# Patient Record
Sex: Male | Born: 1985
Health system: Southern US, Community
[De-identification: ages and names within clinical notes are randomized; demographics above are authoritative.]

## PROBLEM LIST (undated history)

## (undated) DIAGNOSIS — I10 Essential (primary) hypertension: Secondary | ICD-10-CM

## (undated) DIAGNOSIS — K219 Gastro-esophageal reflux disease without esophagitis: Secondary | ICD-10-CM

## (undated) DIAGNOSIS — M542 Cervicalgia: Secondary | ICD-10-CM

## (undated) DIAGNOSIS — G8929 Other chronic pain: Secondary | ICD-10-CM

## (undated) DIAGNOSIS — M549 Dorsalgia, unspecified: Secondary | ICD-10-CM

## (undated) HISTORY — PX: TONSILLECTOMY: SUR1361

## (undated) HISTORY — PX: OTHER SURGICAL HISTORY: SHX169

## (undated) HISTORY — PX: HERNIA REPAIR: SHX51

---
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
## 2006-02-22 ENCOUNTER — Ambulatory Visit: Payer: Self-pay | Admitting: Orthopedic Surgery

## 2006-06-19 ENCOUNTER — Emergency Department (HOSPITAL_COMMUNITY): Admission: EM | Admit: 2006-06-19 | Discharge: 2006-06-20 | Payer: Self-pay | Admitting: Emergency Medicine

## 2007-09-18 ENCOUNTER — Ambulatory Visit (HOSPITAL_COMMUNITY): Admission: RE | Admit: 2007-09-18 | Discharge: 2007-09-18 | Payer: Self-pay | Admitting: Family Medicine

## 2008-10-26 ENCOUNTER — Emergency Department (HOSPITAL_COMMUNITY): Admission: EM | Admit: 2008-10-26 | Discharge: 2008-10-26 | Payer: Self-pay | Admitting: Emergency Medicine

## 2009-04-08 ENCOUNTER — Ambulatory Visit (HOSPITAL_COMMUNITY): Admission: RE | Admit: 2009-04-08 | Discharge: 2009-04-08 | Payer: Self-pay | Admitting: Family Medicine

## 2009-08-14 ENCOUNTER — Emergency Department (HOSPITAL_COMMUNITY): Admission: EM | Admit: 2009-08-14 | Discharge: 2009-08-15 | Payer: Self-pay | Admitting: Emergency Medicine

## 2009-08-26 ENCOUNTER — Ambulatory Visit (HOSPITAL_COMMUNITY): Admission: RE | Admit: 2009-08-26 | Discharge: 2009-08-26 | Payer: Self-pay | Admitting: Internal Medicine

## 2010-03-20 ENCOUNTER — Emergency Department (HOSPITAL_COMMUNITY)
Admission: EM | Admit: 2010-03-20 | Discharge: 2010-03-20 | Payer: Self-pay | Source: Home / Self Care | Admitting: Emergency Medicine

## 2010-03-25 ENCOUNTER — Emergency Department (HOSPITAL_COMMUNITY): Admission: EM | Admit: 2010-03-25 | Discharge: 2009-05-24 | Payer: Self-pay | Admitting: Emergency Medicine

## 2010-05-14 ENCOUNTER — Ambulatory Visit (HOSPITAL_COMMUNITY)
Admission: RE | Admit: 2010-05-14 | Discharge: 2010-05-14 | Payer: Self-pay | Source: Home / Self Care | Attending: Family Medicine | Admitting: Family Medicine

## 2010-06-28 LAB — POCT CARDIAC MARKERS
CKMB, poc: <1 ng/mL — ABNORMAL LOW (ref 1.0–8.0)
CKMB, poc: <1 ng/mL — ABNORMAL LOW (ref 1.0–8.0)
Myoglobin, poc: 39.3 ng/mL (ref 12–200)
Myoglobin, poc: 52.7 ng/mL (ref 12–200)
Troponin i, poc: <0.05 ng/mL (ref 0.00–0.09)
Troponin i, poc: <0.05 ng/mL (ref 0.00–0.09)

## 2010-06-28 LAB — BASIC METABOLIC PANEL
BUN: 15 mg/dL (ref 6–23)
CO2: 29 mEq/L (ref 19–32)
Calcium: 9.6 mg/dL (ref 8.4–10.5)
Chloride: 101 mEq/L (ref 96–112)
Creatinine, Ser: 1.04 mg/dL (ref 0.4–1.5)
GFR calc Af Amer: >60 mL/min (ref >60)
GFR calc non Af Amer: >60 mL/min (ref >60)
Glucose, Bld: 90 mg/dL (ref 70–99)
Potassium: 4.4 mEq/L (ref 3.5–5.1)
Sodium: 138 mEq/L (ref 135–145)

## 2010-06-28 LAB — D-DIMER, QUANTITATIVE (NOT AT ARMC): D-Dimer, Quant: 0.22 ug/mL-FEU (ref 0.00–0.48)

## 2010-07-07 LAB — CBC
HCT: 43.7 % (ref 39.0–52.0)
Hemoglobin: 15 g/dL (ref 13.0–17.0)
MCHC: 34.3 g/dL (ref 30.0–36.0)
MCV: 87.1 fL (ref 78.0–100.0)
Platelets: 254 10*3/uL (ref 150–400)
RBC: 5.01 MIL/uL (ref 4.22–5.81)
RDW: 13.3 % (ref 11.5–15.5)
WBC: 9.3 10*3/uL (ref 4.0–10.5)

## 2010-07-07 LAB — POCT CARDIAC MARKERS
CKMB, poc: <1 ng/mL — ABNORMAL LOW (ref 1.0–8.0)
Myoglobin, poc: 53.7 ng/mL (ref 12–200)
Troponin i, poc: <0.05 ng/mL (ref 0.00–0.09)

## 2010-07-07 LAB — URINALYSIS, ROUTINE W REFLEX MICROSCOPIC
Bilirubin Urine: NEGATIVE
Glucose, UA: NEGATIVE mg/dL
Hgb urine dipstick: NEGATIVE
Ketones, ur: NEGATIVE mg/dL
Nitrite: NEGATIVE
Protein, ur: NEGATIVE mg/dL
Specific Gravity, Urine: 1.02 (ref 1.005–1.030)
Urobilinogen, UA: 0.2 mg/dL (ref 0.0–1.0)
pH: 6.5 (ref 5.0–8.0)

## 2010-07-07 LAB — BASIC METABOLIC PANEL
BUN: 15 mg/dL (ref 6–23)
CO2: 28 mEq/L (ref 19–32)
Calcium: 9.7 mg/dL (ref 8.4–10.5)
Chloride: 103 mEq/L (ref 96–112)
Creatinine, Ser: 1.07 mg/dL (ref 0.4–1.5)
GFR calc Af Amer: >60 mL/min (ref >60)
GFR calc non Af Amer: >60 mL/min (ref >60)
Glucose, Bld: 99 mg/dL (ref 70–99)
Potassium: 4 mEq/L (ref 3.5–5.1)
Sodium: 137 mEq/L (ref 135–145)

## 2010-07-07 LAB — DIFFERENTIAL
Basophils Absolute: 0 10*3/uL (ref 0.0–0.1)
Basophils Relative: 0 % (ref 0–1)
Eosinophils Absolute: 0.1 10*3/uL (ref 0.0–0.7)
Eosinophils Relative: 1 % (ref 0–5)
Lymphocytes Relative: 24 % (ref 12–46)
Lymphs Abs: 2.2 10*3/uL (ref 0.7–4.0)
Monocytes Absolute: 0.8 10*3/uL (ref 0.1–1.0)
Monocytes Relative: 9 % (ref 3–12)
Neutro Abs: 6.1 10*3/uL (ref 1.7–7.7)
Neutrophils Relative %: 66 % (ref 43–77)

## 2010-07-07 LAB — RAPID URINE DRUG SCREEN, HOSP PERFORMED
Amphetamines: NOT DETECTED
Barbiturates: NOT DETECTED
Benzodiazepines: NOT DETECTED
Cocaine: NOT DETECTED
Opiates: NOT DETECTED
Tetrahydrocannabinol: NOT DETECTED

## 2010-07-07 LAB — D-DIMER, QUANTITATIVE: D-Dimer, Quant: 0.22 ug/mL-FEU (ref 0.00–0.48)

## 2010-07-11 ENCOUNTER — Emergency Department (HOSPITAL_COMMUNITY): Payer: Managed Care, Other (non HMO)

## 2010-07-11 ENCOUNTER — Emergency Department (HOSPITAL_COMMUNITY)
Admission: EM | Admit: 2010-07-11 | Discharge: 2010-07-11 | Disposition: A | Discharge status: Home / Self Care | Payer: Managed Care, Other (non HMO) | Attending: Emergency Medicine | Admitting: Emergency Medicine

## 2010-07-11 DIAGNOSIS — S0993XA Unspecified injury of face, initial encounter: Secondary | ICD-10-CM | POA: Insufficient documentation

## 2010-07-11 DIAGNOSIS — S199XXA Unspecified injury of neck, initial encounter: Secondary | ICD-10-CM | POA: Insufficient documentation

## 2010-07-11 DIAGNOSIS — W010XXA Fall on same level from slipping, tripping and stumbling without subsequent striking against object, initial encounter: Secondary | ICD-10-CM | POA: Insufficient documentation

## 2010-07-11 DIAGNOSIS — S0990XA Unspecified injury of head, initial encounter: Secondary | ICD-10-CM | POA: Insufficient documentation

## 2010-07-11 DIAGNOSIS — S060X0A Concussion without loss of consciousness, initial encounter: Secondary | ICD-10-CM | POA: Insufficient documentation

## 2010-07-11 DIAGNOSIS — R42 Dizziness and giddiness: Secondary | ICD-10-CM | POA: Insufficient documentation

## 2010-08-09 ENCOUNTER — Emergency Department (HOSPITAL_COMMUNITY): Payer: Managed Care, Other (non HMO)

## 2010-08-09 ENCOUNTER — Emergency Department (HOSPITAL_COMMUNITY)
Admission: EM | Admit: 2010-08-09 | Discharge: 2010-08-09 | Disposition: A | Discharge status: Home / Self Care | Payer: Managed Care, Other (non HMO) | Attending: Emergency Medicine | Admitting: Emergency Medicine

## 2010-08-09 DIAGNOSIS — T07XXXA Unspecified multiple injuries, initial encounter: Secondary | ICD-10-CM | POA: Insufficient documentation

## 2010-08-09 DIAGNOSIS — Y9229 Other specified public building as the place of occurrence of the external cause: Secondary | ICD-10-CM | POA: Insufficient documentation

## 2010-08-09 DIAGNOSIS — S0990XA Unspecified injury of head, initial encounter: Secondary | ICD-10-CM | POA: Insufficient documentation

## 2010-08-09 DIAGNOSIS — Y998 Other external cause status: Secondary | ICD-10-CM | POA: Insufficient documentation

## 2010-08-09 DIAGNOSIS — M545 Low back pain, unspecified: Secondary | ICD-10-CM | POA: Insufficient documentation

## 2010-08-09 DIAGNOSIS — M542 Cervicalgia: Secondary | ICD-10-CM | POA: Insufficient documentation

## 2010-08-09 DIAGNOSIS — J45909 Unspecified asthma, uncomplicated: Secondary | ICD-10-CM | POA: Insufficient documentation

## 2010-08-09 DIAGNOSIS — R51 Headache: Secondary | ICD-10-CM | POA: Insufficient documentation

## 2010-08-09 DIAGNOSIS — R079 Chest pain, unspecified: Secondary | ICD-10-CM | POA: Insufficient documentation

## 2010-08-09 DIAGNOSIS — Z79899 Other long term (current) drug therapy: Secondary | ICD-10-CM | POA: Insufficient documentation

## 2010-08-09 LAB — DIFFERENTIAL
Basophils Absolute: 0 10*3/uL (ref 0.0–0.1)
Basophils Relative: 0 % (ref 0–1)
Eosinophils Absolute: 0.3 10*3/uL (ref 0.0–0.7)
Eosinophils Relative: 3 % (ref 0–5)
Lymphocytes Relative: 24 % (ref 12–46)
Lymphs Abs: 2.5 10*3/uL (ref 0.7–4.0)
Monocytes Absolute: 1 10*3/uL (ref 0.1–1.0)
Monocytes Relative: 9 % (ref 3–12)
Neutro Abs: 6.7 10*3/uL (ref 1.7–7.7)
Neutrophils Relative %: 64 % (ref 43–77)

## 2010-08-09 LAB — CBC
HCT: 45 % (ref 39.0–52.0)
Hemoglobin: 15.6 g/dL (ref 13.0–17.0)
MCH: 29.6 pg (ref 26.0–34.0)
MCHC: 34.7 g/dL (ref 30.0–36.0)
MCV: 85.4 fL (ref 78.0–100.0)
Platelets: 319 10*3/uL (ref 150–400)
RBC: 5.27 MIL/uL (ref 4.22–5.81)
RDW: 12.7 % (ref 11.5–15.5)
WBC: 10.4 10*3/uL (ref 4.0–10.5)

## 2010-08-09 LAB — URINALYSIS, ROUTINE W REFLEX MICROSCOPIC
Bilirubin Urine: NEGATIVE
Glucose, UA: NEGATIVE mg/dL
Hgb urine dipstick: NEGATIVE
Ketones, ur: NEGATIVE mg/dL
Nitrite: NEGATIVE
Protein, ur: NEGATIVE mg/dL
Specific Gravity, Urine: 1.025 (ref 1.005–1.030)
Urobilinogen, UA: 0.2 mg/dL (ref 0.0–1.0)
pH: 6 (ref 5.0–8.0)

## 2010-08-09 LAB — POCT CARDIAC MARKERS
CKMB, poc: <1 ng/mL — ABNORMAL LOW (ref 1.0–8.0)
Myoglobin, poc: 34.6 ng/mL (ref 12–200)
Troponin i, poc: <0.05 ng/mL (ref 0.00–0.09)

## 2010-08-09 LAB — BASIC METABOLIC PANEL
BUN: 13 mg/dL (ref 6–23)
CO2: 27 mEq/L (ref 19–32)
Calcium: 9.9 mg/dL (ref 8.4–10.5)
Chloride: 103 mEq/L (ref 96–112)
Creatinine, Ser: 0.92 mg/dL (ref 0.4–1.5)
GFR calc Af Amer: >60 mL/min (ref >60)
GFR calc non Af Amer: >60 mL/min (ref >60)
Glucose, Bld: 108 mg/dL — ABNORMAL HIGH (ref 70–99)
Potassium: 3.7 mEq/L (ref 3.5–5.1)
Sodium: 139 mEq/L (ref 135–145)

## 2010-10-10 ENCOUNTER — Emergency Department (HOSPITAL_COMMUNITY): Payer: Managed Care, Other (non HMO)

## 2010-10-10 ENCOUNTER — Emergency Department (HOSPITAL_COMMUNITY)
Admission: EM | Admit: 2010-10-10 | Discharge: 2010-10-10 | Disposition: A | Discharge status: Home / Self Care | Payer: Managed Care, Other (non HMO) | Attending: Emergency Medicine | Admitting: Emergency Medicine

## 2010-10-10 DIAGNOSIS — S60229A Contusion of unspecified hand, initial encounter: Secondary | ICD-10-CM | POA: Insufficient documentation

## 2010-10-10 DIAGNOSIS — J45909 Unspecified asthma, uncomplicated: Secondary | ICD-10-CM | POA: Insufficient documentation

## 2010-10-10 DIAGNOSIS — IMO0002 Reserved for concepts with insufficient information to code with codable children: Secondary | ICD-10-CM | POA: Insufficient documentation

## 2010-10-10 DIAGNOSIS — X58XXXA Exposure to other specified factors, initial encounter: Secondary | ICD-10-CM | POA: Insufficient documentation

## 2010-11-07 ENCOUNTER — Other Ambulatory Visit: Payer: Self-pay

## 2010-11-07 ENCOUNTER — Encounter: Payer: Self-pay | Admitting: *Deleted

## 2010-11-07 ENCOUNTER — Emergency Department (HOSPITAL_COMMUNITY)
Admission: EM | Admit: 2010-11-07 | Discharge: 2010-11-07 | Disposition: A | Discharge status: Home / Self Care | Payer: Managed Care, Other (non HMO) | Attending: Emergency Medicine | Admitting: Emergency Medicine

## 2010-11-07 ENCOUNTER — Emergency Department (HOSPITAL_COMMUNITY): Payer: Managed Care, Other (non HMO)

## 2010-11-07 DIAGNOSIS — R079 Chest pain, unspecified: Secondary | ICD-10-CM | POA: Insufficient documentation

## 2010-11-07 DIAGNOSIS — M549 Dorsalgia, unspecified: Secondary | ICD-10-CM | POA: Insufficient documentation

## 2010-11-07 DIAGNOSIS — Z807 Family history of other malignant neoplasms of lymphoid, hematopoietic and related tissues: Secondary | ICD-10-CM | POA: Insufficient documentation

## 2010-11-07 DIAGNOSIS — J45909 Unspecified asthma, uncomplicated: Secondary | ICD-10-CM | POA: Insufficient documentation

## 2010-11-07 DIAGNOSIS — I10 Essential (primary) hypertension: Secondary | ICD-10-CM | POA: Insufficient documentation

## 2010-11-07 DIAGNOSIS — F17200 Nicotine dependence, unspecified, uncomplicated: Secondary | ICD-10-CM | POA: Insufficient documentation

## 2010-11-07 HISTORY — DX: Essential (primary) hypertension: I10

## 2010-11-07 HISTORY — DX: Other chronic pain: G89.29

## 2010-11-07 HISTORY — DX: Dorsalgia, unspecified: M54.9

## 2010-11-07 LAB — CBC
HCT: 41.5 % (ref 39.0–52.0)
Hemoglobin: 14.1 g/dL (ref 13.0–17.0)
MCH: 29.4 pg (ref 26.0–34.0)
MCHC: 34 g/dL (ref 30.0–36.0)
MCV: 86.5 fL (ref 78.0–100.0)
Platelets: 282 10*3/uL (ref 150–400)
RBC: 4.8 MIL/uL (ref 4.22–5.81)
RDW: 12.9 % (ref 11.5–15.5)
WBC: 11.7 10*3/uL — ABNORMAL HIGH (ref 4.0–10.5)

## 2010-11-07 MED ORDER — IBUPROFEN 800 MG PO TABS: 800.0000 mg | ORAL_TABLET | Freq: Three times a day (TID) | ORAL | Status: AC

## 2010-11-07 MED ORDER — KETOROLAC TROMETHAMINE 60 MG/2ML IM SOLN
60.0000 mg | Freq: Once | INTRAMUSCULAR | Status: AC
Start: 2010-11-07 — End: 2010-11-07
  Administered 2010-11-07: 60 mg via INTRAMUSCULAR
  Filled 2010-11-07: qty 2

## 2010-11-07 NOTE — ED Notes (Signed)
Pt states that he has been having worsening chest pain x 4 days. Pt also c/o nausea. States that his brother just found out he has cancer and he has been worried about him. Denies shortness of breath.

## 2010-11-07 NOTE — ED Provider Notes (Signed)
History     No chief complaint on file.  HPI Comments: Patient is a 25 year old otherwise healthy male with treated hypertension, presents with chest pain which has been fluctuating for 4 days. It is sharp, intermittent, today seems to be more constant, and is similar to pain that he has had frequently in the past. He found out approximately 3 weeks ago that his brother was recently diagnosed with lymphoma. He notes being anxious since this time. He also lives near his mother who has recently flown to [place] on airplane who is widowed with health problems including multiple brain surgeries. All these factors promote his anxiety which he admits to today. He has had aspirin last night and Vicodin this morning with some improvement of his pain though it has returned at this time. He denies any relation to deep breathing or movement or position or eating or exertion.  The history is provided by the patient and medical records.    Past Medical History  Diagnosis Date  . Hypertension   . Chronic back pain   . Asthma     Past Surgical History  Procedure Date  . Hernia repair     Family History  Problem Relation Age of Onset  . Hypertension Mother   . Seizures Mother   . Cancer Brother     History  Substance Use Topics  . Smoking status: Current Some Day Smoker    Types: Cigarettes  . Smokeless tobacco: Not on file  . Alcohol Use: No      Review of Systems  All other systems reviewed and are negative.    Physical Exam  BP 147/99  Pulse 78  Temp(Src) 98.8 F (37.1 C) (Oral)  Resp 78  SpO2 98%  Physical Exam  Nursing note and vitals reviewed. Constitutional: He appears well-developed and well-nourished. No distress.  HENT:  Head: Normocephalic and atraumatic.  Mouth/Throat: Oropharynx is clear and moist. No oropharyngeal exudate.  Eyes: Conjunctivae and EOM are normal. Pupils are equal, round, and reactive to light. Right eye exhibits no discharge. Left eye exhibits no  discharge. No scleral icterus.  Neck: Normal range of motion. Neck supple. No JVD present. No thyromegaly present.  Cardiovascular: Normal rate, regular rhythm, normal heart sounds and intact distal pulses.  Exam reveals no gallop and no friction rub.   No murmur heard. Pulmonary/Chest: Effort normal and breath sounds normal. No respiratory distress. He has no wheezes. He has no rales.  Abdominal: Soft. Bowel sounds are normal. He exhibits no distension and no mass. There is no tenderness.  Musculoskeletal: Normal range of motion. He exhibits no edema and no tenderness.  Lymphadenopathy:    He has no cervical adenopathy.  Neurological: He is alert. Coordination normal.  Skin: Skin is warm and dry. No rash noted. He is not diaphoretic. No erythema.  Psychiatric: He has a normal mood and affect. His behavior is normal.    ED Course  Procedures  MDM Patient has normal EKG, vital signs within reason given history of hypertension, mild anxious appearing. Will get chest x-ray to rule out pneumothorax, CBC to release the anxiety of possible blood born illness in himself. ED ECG REPORT   Date: 11/07/2010   Rate: 78  Rhythm: normal sinus rhythm  QRS Axis: normal  Intervals: normal  ST/T Wave abnormalities: normal  Conduction Disutrbances:none  Narrative Interpretation:   Old EKG Reviewed: unchanged 08/09/10  Chest x-ray negative for any acute findings, CBC with normal hemoglobin slight leukocytosis but no signs  of pericarditis on EKG, no rubs murmurs or gallops, on exam, and no infiltrates on x-ray. Patient aware results and will followup with family doctor as indicated.     Vida Roller, MD 11/07/10 [phone]

## 2010-11-07 NOTE — ED Notes (Signed)
Pt showing NSR on CCM. Pt alert and oriented x 3. Skin warm and dry. Color pink. Pt anxious and states that it has been gradually getting worse.

## 2010-12-03 ENCOUNTER — Emergency Department (HOSPITAL_COMMUNITY)
Admission: EM | Admit: 2010-12-03 | Discharge: 2010-12-04 | Disposition: A | Discharge status: Home / Self Care | Payer: Managed Care, Other (non HMO) | Attending: Emergency Medicine | Admitting: Emergency Medicine

## 2010-12-03 ENCOUNTER — Encounter (HOSPITAL_COMMUNITY): Payer: Self-pay | Admitting: *Deleted

## 2010-12-03 ENCOUNTER — Emergency Department (HOSPITAL_COMMUNITY): Payer: Managed Care, Other (non HMO)

## 2010-12-03 ENCOUNTER — Other Ambulatory Visit: Payer: Self-pay

## 2010-12-03 DIAGNOSIS — M7989 Other specified soft tissue disorders: Secondary | ICD-10-CM | POA: Insufficient documentation

## 2010-12-03 DIAGNOSIS — F17200 Nicotine dependence, unspecified, uncomplicated: Secondary | ICD-10-CM | POA: Insufficient documentation

## 2010-12-03 DIAGNOSIS — M549 Dorsalgia, unspecified: Secondary | ICD-10-CM | POA: Insufficient documentation

## 2010-12-03 DIAGNOSIS — I10 Essential (primary) hypertension: Secondary | ICD-10-CM | POA: Insufficient documentation

## 2010-12-03 DIAGNOSIS — J45909 Unspecified asthma, uncomplicated: Secondary | ICD-10-CM | POA: Insufficient documentation

## 2010-12-03 DIAGNOSIS — R079 Chest pain, unspecified: Secondary | ICD-10-CM | POA: Insufficient documentation

## 2010-12-03 LAB — BASIC METABOLIC PANEL
BUN: 15 mg/dL (ref 6–23)
CO2: 30 mEq/L (ref 19–32)
Calcium: 10.3 mg/dL (ref 8.4–10.5)
Chloride: 96 mEq/L (ref 96–112)
Creatinine, Ser: 0.86 mg/dL (ref 0.50–1.35)
GFR calc Af Amer: >60 mL/min (ref >60)
GFR calc non Af Amer: >60 mL/min (ref >60)
Glucose, Bld: 75 mg/dL (ref 70–99)
Potassium: 3.7 mEq/L (ref 3.5–5.1)
Sodium: 135 mEq/L (ref 135–145)

## 2010-12-03 LAB — CBC
HCT: 41.8 % (ref 39.0–52.0)
Hemoglobin: 14.5 g/dL (ref 13.0–17.0)
MCH: 29.5 pg (ref 26.0–34.0)
MCHC: 34.7 g/dL (ref 30.0–36.0)
MCV: 85.1 fL (ref 78.0–100.0)
Platelets: 308 10*3/uL (ref 150–400)
RBC: 4.91 MIL/uL (ref 4.22–5.81)
RDW: 12.8 % (ref 11.5–15.5)
WBC: 11.1 10*3/uL — ABNORMAL HIGH (ref 4.0–10.5)

## 2010-12-03 LAB — DIFFERENTIAL
Basophils Absolute: 0 10*3/uL (ref 0.0–0.1)
Basophils Relative: 0 % (ref 0–1)
Eosinophils Absolute: 0.3 10*3/uL (ref 0.0–0.7)
Eosinophils Relative: 3 % (ref 0–5)
Lymphocytes Relative: 27 % (ref 12–46)
Lymphs Abs: 3 10*3/uL (ref 0.7–4.0)
Monocytes Absolute: 0.9 10*3/uL (ref 0.1–1.0)
Monocytes Relative: 8 % (ref 3–12)
Neutro Abs: 6.9 10*3/uL (ref 1.7–7.7)
Neutrophils Relative %: 62 % (ref 43–77)

## 2010-12-03 LAB — POCT I-STAT TROPONIN I: Troponin i, poc: 0 ng/mL (ref 0.00–0.08)

## 2010-12-03 LAB — CARDIAC PANEL(CRET KIN+CKTOT+MB+TROPI)
CK, MB: 1.9 ng/mL (ref 0.3–4.0)
Relative Index: 1.9 (ref 0.0–2.5)
Total CK: 101 U/L (ref 7–232)
Troponin I: <0.3 ng/mL (ref <0.30)

## 2010-12-03 LAB — D-DIMER, QUANTITATIVE (NOT AT ARMC): D-Dimer, Quant: 0.56 ug/mL-FEU — ABNORMAL HIGH (ref 0.00–0.48)

## 2010-12-03 MED ORDER — SODIUM CHLORIDE 0.9 % IV SOLN: Freq: Once | INTRAVENOUS | Status: DC

## 2010-12-03 MED ORDER — IOHEXOL 300 MG/ML  SOLN
80.0000 mL | Freq: Once | INTRAMUSCULAR | Status: AC | PRN
  Administered 2010-12-03: 80 mL via INTRAVENOUS

## 2010-12-03 MED ORDER — PANTOPRAZOLE SODIUM 40 MG IV SOLR
40.0000 mg | Freq: Once | INTRAVENOUS | Status: AC
  Administered 2010-12-03: 40 mg via INTRAVENOUS
  Filled 2010-12-03: qty 40

## 2010-12-03 MED ORDER — ONDANSETRON HCL 4 MG/2ML IJ SOLN
4.0000 mg | Freq: Once | INTRAMUSCULAR | Status: AC
  Administered 2010-12-03: 4 mg via INTRAVENOUS
  Filled 2010-12-03: qty 2

## 2010-12-03 MED ORDER — MORPHINE SULFATE 4 MG/ML IJ SOLN
4.0000 mg | Freq: Once | INTRAMUSCULAR | Status: AC
  Administered 2010-12-03: 4 mg via INTRAVENOUS
  Filled 2010-12-03: qty 1

## 2010-12-03 NOTE — ED Provider Notes (Signed)
History     CSN: [id number] Arrival date & time: 12/03/2010  8:26 PM  Chief Complaint  Patient presents with  . Chest Pain  . Extremity Weakness   HPI Comments: Seen 1023. Seen on two occasions in the ER and by his PMD for similar pain to the mid chest. Sharp deep pain. CT scan recommended by PMD was refused by insurance company. Will repeat all labs again, obtain CT scan and determine next referral or follow up with his PMD. Patient states pain begins with no stimulus, deep stabbing pain. It is often associated  with numbness and tingling to his left arm. It is not relieved by ibuprofen, indocine or vicodin. Denies fever, chills, shortness of breath,  Occasional nausea, vomiting. Currently under treatment for strept throat with zithromax with new prescription for cipro to begin once finished with zithromax.    Patient is a 25 y.o. male presenting with chest pain and extremity weakness. The history is provided by the patient.  Chest Pain Episode onset: one week. Chest pain occurs constantly. The chest pain is unchanged. At its most intense, the pain is at 8/10. The pain is currently at 8/10. The severity of the pain is moderate. The quality of the pain is described as aching and sharp (deep sharp pain to chest). Pertinent negatives for primary symptoms include no fever, no syncope, no shortness of breath, no cough, no wheezing and no palpitations.    Extremity Weakness Associated symptoms include chest pain. Pertinent negatives include no shortness of breath.    Past Medical History  Diagnosis Date  . Hypertension   . Chronic back pain   . Asthma     Past Surgical History  Procedure Date  . Hernia repair     Family History  Problem Relation Age of Onset  . Hypertension Mother   . Seizures Mother   . Cancer Brother     History  Substance Use Topics  . Smoking status: Current Some Day Smoker    Types: Cigarettes  . Smokeless tobacco: Not on file  . Alcohol Use: No       Review of Systems  Constitutional: Negative for fever.  HENT: Positive for sore throat and trouble swallowing.   Respiratory: Negative for cough, shortness of breath and wheezing.   Cardiovascular: Positive for chest pain. Negative for palpitations and syncope.  Musculoskeletal: Positive for extremity weakness.  All other systems reviewed and are negative.    Physical Exam  BP 129/81  Pulse 92  Temp 98 F (36.7 C)  Resp 18  Ht 6\' 2"  (1.88 m)  Wt 214 lb (97.07 kg)  BMI 27.48 kg/m2  SpO2 95%  Physical Exam  Nursing note reviewed. Constitutional: He is oriented to person, place, and time. He appears well-developed and well-nourished.  HENT:  Head: Normocephalic and atraumatic.  Right Ear: External ear normal.  Left Ear: External ear normal.  Mouth/Throat: Oropharynx is clear and moist.  Eyes: EOM are normal. Pupils are equal, round, and reactive to light.  Neck: Normal range of motion. Neck supple.  Cardiovascular: Normal rate, regular rhythm, normal heart sounds and intact distal pulses.   Pulmonary/Chest: Effort normal and breath sounds normal.  Abdominal: Soft.  Musculoskeletal: Normal range of motion.  Neurological: He is alert and oriented to person, place, and time.  Skin: Skin is warm and dry.  Psychiatric: He has a normal mood and affect. His behavior is normal.    ED Course  Procedures  MDM Results for orders placed  during the hospital encounter of 12/03/10  BASIC METABOLIC PANEL      Component Value Range   Sodium 135  135 - 145 (mEq/L)   Potassium 3.7  3.5 - 5.1 (mEq/L)   Chloride 96  96 - 112 (mEq/L)   CO2 30  19 - 32 (mEq/L)   Glucose, Bld 75  70 - 99 (mg/dL)   BUN 15  6 - 23 (mg/dL)   Creatinine, Ser 1.61  0.50 - 1.35 (mg/dL)   Calcium 09.6  8.4 - 10.5 (mg/dL)   GFR calc non Af Amer >60  >60 (mL/min)   GFR calc Af Amer >60  >60 (mL/min)  CBC      Component Value Range   WBC 11.1 (*) 4.0 - 10.5 (K/uL)   RBC 4.91  4.22 - 5.81 (MIL/uL)    Hemoglobin 14.5  13.0 - 17.0 (g/dL)   HCT 04.5  40.9 - 81.1 (%)   MCV 85.1  78.0 - 100.0 (fL)   MCH 29.5  26.0 - 34.0 (pg)   MCHC 34.7  30.0 - 36.0 (g/dL)   RDW 91.4  78.2 - 95.6 (%)   Platelets 308  150 - 400 (K/uL)  DIFFERENTIAL      Component Value Range   Neutrophils Relative 62  43 - 77 (%)   Neutro Abs 6.9  1.7 - 7.7 (K/uL)   Lymphocytes Relative 27  12 - 46 (%)   Lymphs Abs 3.0  0.7 - 4.0 (K/uL)   Monocytes Relative 8  3 - 12 (%)   Monocytes Absolute 0.9  0.1 - 1.0 (K/uL)   Eosinophils Relative 3  0 - 5 (%)   Eosinophils Absolute 0.3  0.0 - 0.7 (K/uL)   Basophils Relative 0  0 - 1 (%)   Basophils Absolute 0.0  0.0 - 0.1 (K/uL)  POCT I-STAT TROPONIN I      Component Value Range   Troponin i, poc 0.00  0.00 - 0.08 (ng/mL)   Comment 3           D-DIMER, QUANTITATIVE      Component Value Range   D-Dimer, Quant 0.56 (*) 0.00 - 0.48 (ug/mL-FEU)  CARDIAC PANEL(CRET KIN+CKTOT+MB+TROPI)      Component Value Range   Total CK 101  7 - 232 (U/L)   CK, MB 1.9  0.3 - 4.0 (ng/mL)   Troponin I <0.30  <0.30 (ng/mL)   Relative Index 1.9  0.0 - 2.5       Dg Chest 2 View  12/03/2010  *RADIOLOGY REPORT*  Clinical Data: Chest pain; history of smoking.  PA and lateral radiographs requested with full inspiration, given persistent symptoms.  CHEST - 2 VIEW  Comparison: Chest radiograph performed earlier today at 08:50 p.m.  Findings: The lungs are well-aerated and clear.  There is no evidence of focal opacification, pleural effusion or pneumothorax.  The heart is normal in size; the mediastinal contour is within normal limits.  No acute osseous abnormalities are seen.  IMPRESSION: No acute cardiopulmonary process seen.  Original Report Authenticated By: Tonia Ghent, M.D.   Ct Chest W Contrast  12/03/2010  *RADIOLOGY REPORT*  Clinical Data: Mid chest pain, asthma, smoker  CT CHEST WITH CONTRAST  Technique:  Multidetector CT imaging of the chest was performed following the standard protocol during  bolus administration of intravenous contrast.  Contrast: 80 ml Omnipaque 300 IV.  Comparison: None  Findings: Aorta normal caliber. No thoracic adenopathy. Visualized portion of upper abdomen unremarkable. 4 mm  diameter left lower lobe pulmonary nodules images 46 and 48. Questionable tiny subpleural nodule left lower lobe image 58. Remaining lungs clear. No infiltrate, effusion, or pneumothorax. No acute osseous findings.  IMPRESSION: No acute intrathoracic abnormalities. 4 mm diameter left lower lobe pulmonary nodules, recommendation below. If the patient is at high risk for bronchogenic carcinoma, follow- up chest CT at 1 year is recommended.  If the patient is at low risk, no follow-up is needed.  This recommendation follows the consensus statement: Guidelines for Management of Small Pulmonary Nodules Detected on CT Scans:  A Statement from the Fleischner Society as published in Radiology 2005; 237:395-400.  Available online at:  DietDisorder.cz.  Original Report Authenticated By: Lollie Marrow, M.D.   Dg Chest Portable 1 View  12/03/2010  *RADIOLOGY REPORT*  Clinical Data: Chest pain  PORTABLE CHEST - 1 VIEW  Comparison: 11/07/2010  Findings: Cardiac leads overlie the chest.  Heart size is normal. Lung volumes are low but clear.  No pleural effusion.  No acute osseous finding.  Heart size is upper limits of normal, which may be in part related to technique.  No focal pulmonary opacity.  IMPRESSION: Lung volumes are low with crowding of the bronchovascular markings. No focal abnormality. If the patient's symptoms continue, consider PA and lateral chest radiographs obtained at full inspiration when the patient is clinically able.  Original Report Authenticated By: Harrel Lemon, M.D.   MDM Reviewed: previous chart, nursing note and vitals Reviewed previous: labs, ECG and x-ray Interpretation: labs, x-ray and CT scan    Reviewed results with patient and his  mother. He has a Rx for prednisone written by PMD which has not been filled yet and will be started tomorrow.   Nicoletta Dress. Colon Branch, MD 12/04/10 [phone]

## 2010-12-03 NOTE — ED Notes (Signed)
Pt c/o intermittent  Chest pain x 1wk that got increasingly worse. Pt c/o deep, stabbing, sharp pains and pressure in his chest. Pt also c/o left arm numbness and left side throat/neck pain.

## 2010-12-03 NOTE — ED Provider Notes (Signed)
History     CSN: [id number] Arrival date & time: 12/03/2010  8:26 PM  Chief Complaint  Patient presents with  . Chest Pain  . Extremity Weakness   HPI  Past Medical History  Diagnosis Date  . Hypertension   . Chronic back pain   . Asthma     Past Surgical History  Procedure Date  . Hernia repair     Family History  Problem Relation Age of Onset  . Hypertension Mother   . Seizures Mother   . Cancer Brother     History  Substance Use Topics  . Smoking status: Current Some Day Smoker    Types: Cigarettes  . Smokeless tobacco: Not on file  . Alcohol Use: No      Review of Systems  Physical Exam  BP 139/82  Pulse 108  Temp 98 F (36.7 C)  Resp 20  Ht 6\' 2"  (1.88 m)  Wt 214 lb (97.07 kg)  BMI 27.48 kg/m2  SpO2 97%  Physical Exam  ED Course  Procedures  MDM      Date:12/03/2010  Rate: 104 Rhythm: sinus tachycardia  QRS Axis: normal  Intervals: normal  ST/T Wave abnormalities: normal  Conduction Disutrbances:none  Narrative Interpretation:   Old EKG Reviewed: none available   Donnetta Hutching, MD 12/20/10 [phone]

## 2010-12-03 NOTE — ED Notes (Signed)
Pt states he really feels no change in condition after medication. edp advised.

## 2010-12-03 NOTE — ED Notes (Signed)
Pt reports ongoing chest pains for the last week. Became worse today. Described as a pressure & numbness in left arm. Denies any new activity or injury.

## 2011-01-12 ENCOUNTER — Encounter (HOSPITAL_COMMUNITY): Payer: Self-pay

## 2011-01-12 ENCOUNTER — Emergency Department (HOSPITAL_COMMUNITY): Payer: Managed Care, Other (non HMO)

## 2011-01-12 ENCOUNTER — Emergency Department (HOSPITAL_COMMUNITY)
Admission: EM | Admit: 2011-01-12 | Discharge: 2011-01-13 | Disposition: A | Discharge status: Home / Self Care | Payer: Managed Care, Other (non HMO) | Attending: Emergency Medicine | Admitting: Emergency Medicine

## 2011-01-12 DIAGNOSIS — S8390XA Sprain of unspecified site of unspecified knee, initial encounter: Secondary | ICD-10-CM

## 2011-01-12 DIAGNOSIS — IMO0002 Reserved for concepts with insufficient information to code with codable children: Secondary | ICD-10-CM | POA: Insufficient documentation

## 2011-01-12 DIAGNOSIS — W1789XA Other fall from one level to another, initial encounter: Secondary | ICD-10-CM | POA: Insufficient documentation

## 2011-01-12 DIAGNOSIS — M2550 Pain in unspecified joint: Secondary | ICD-10-CM | POA: Insufficient documentation

## 2011-01-12 DIAGNOSIS — R269 Unspecified abnormalities of gait and mobility: Secondary | ICD-10-CM | POA: Insufficient documentation

## 2011-01-12 NOTE — ED Notes (Signed)
Pt reports falling today outside and injuring his rt knee.  Pt reports "my knee bowed in".  No swelling noted.

## 2011-01-13 MED ORDER — HYDROMORPHONE HCL 1 MG/ML IJ SOLN
1.0000 mg | Freq: Once | INTRAMUSCULAR | Status: AC
  Administered 2011-01-13: 1 mg via INTRAMUSCULAR
  Filled 2011-01-13: qty 1

## 2011-01-13 MED ORDER — KETOROLAC TROMETHAMINE 60 MG/2ML IM SOLN
60.0000 mg | Freq: Once | INTRAMUSCULAR | Status: AC
  Administered 2011-01-13: 60 mg via INTRAMUSCULAR
  Filled 2011-01-13: qty 2

## 2011-01-13 MED ORDER — OXYCODONE-ACETAMINOPHEN 5-325 MG PO TABS: 1.0000 | ORAL_TABLET | ORAL | Status: AC | PRN

## 2011-01-13 NOTE — ED Provider Notes (Signed)
Medical screening examination/treatment/procedure(s) were performed by non-physician practitioner and as supervising physician I was immediately available for consultation/collaboration.  Raeford Razor, MD 01/13/11 [phone]

## 2011-01-13 NOTE — ED Provider Notes (Signed)
History     CSN: [id number] Arrival date & time: 01/12/2011 11:49 PM  Chief Complaint  Patient presents with  . Knee Injury    (Consider location/radiation/quality/duration/timing/severity/associated sxs/prior treatment) Patient is a 25 y.o. male presenting with knee pain. The history is provided by the patient.  Knee Pain This is a new problem. The current episode started today. The problem occurs constantly. The problem has been unchanged. Associated symptoms include arthralgias. Pertinent negatives include no abdominal pain, chest pain, congestion, fever, headaches, joint swelling, nausea, neck pain, numbness, rash, sore throat or weakness. Associated symptoms comments: Patient tripped into a ditch while doing a landscaping project around 7 pm tonight.  His right knee bowed "in" toward his midline during the event.  He reports the worst pain is in his bilaleral lower patellar area.  He is unable to bear weight since the event.  He has a history of a meniscal injury and has seen Dr Ranell Patrick for about 12 months ago.. The symptoms are aggravated by walking and standing. He has tried nothing for the symptoms.    Past Medical History  Diagnosis Date  . Hypertension   . Chronic back pain   . Asthma     Past Surgical History  Procedure Date  . Hernia repair     Family History  Problem Relation Age of Onset  . Hypertension Mother   . Seizures Mother   . Cancer Brother     History  Substance Use Topics  . Smoking status: Current Some Day Smoker    Types: Cigarettes  . Smokeless tobacco: Not on file  . Alcohol Use: No      Review of Systems  Constitutional: Negative for fever.  HENT: Negative for congestion, sore throat and neck pain.   Eyes: Negative.   Respiratory: Negative for chest tightness and shortness of breath.   Cardiovascular: Negative for chest pain.  Gastrointestinal: Negative for nausea and abdominal pain.  Genitourinary: Negative.   Musculoskeletal: Positive  for arthralgias and gait problem. Negative for joint swelling.  Skin: Negative.  Negative for rash and wound.  Neurological: Negative for dizziness, weakness, light-headedness, numbness and headaches.  Hematological: Negative.   Psychiatric/Behavioral: Negative.     Allergies  Peanuts; Penicillins; and Shellfish allergy  Home Medications   Current Outpatient Rx  Name Route Sig Dispense Refill  . ALPRAZOLAM 1 MG PO TABS Oral Take 1 mg by mouth 3 (three) times daily as needed. For pain    . VITAMIN C PO Oral Take 1 tablet by mouth daily.      . AZITHROMYCIN 250 MG PO TABS Oral Take 250 mg by mouth daily. As directed on inside of package. Take two tablets on day 1, then take one tablet every day for 4 days     . HYDROCODONE-ACETAMINOPHEN 7.5-325 MG PO TABS Oral Take 1 tablet by mouth every 6 (six) hours as needed. For back pain    . IBUPROFEN 800 MG PO TABS Oral Take 800 mg by mouth daily as needed. For pain     . MONTELUKAST SODIUM 10 MG PO TABS Oral Take 10 mg by mouth at bedtime. For asthma    . PREDNISONE (PAK) 10 MG PO TABS Oral Take 10 mg by mouth daily. As directed on package     . PROPRANOLOL HCL 80 MG PO TABS Oral Take 80 mg by mouth daily.        BP 130/83  Pulse 108  Temp(Src) 97.7 F (36.5 C) (Oral)  Resp  20  Ht 6\' 2"  (1.88 m)  Wt 216 lb (97.977 kg)  BMI 27.73 kg/m2  SpO2 96%  Physical Exam  Nursing note and vitals reviewed. Constitutional: He is oriented to person, place, and time. He appears well-developed and well-nourished.  HENT:  Head: Normocephalic.  Eyes: Conjunctivae are normal.  Neck: Normal range of motion.  Cardiovascular: Normal rate and intact distal pulses.  Exam reveals no decreased pulses.   Pulses:      Dorsalis pedis pulses are 2+ on the right side, and 2+ on the left side.       Posterior tibial pulses are 2+ on the right side, and 2+ on the left side.  Pulmonary/Chest: Effort normal.  Musculoskeletal: He exhibits edema and tenderness.        Right knee: He exhibits decreased range of motion and bony tenderness. He exhibits no swelling, no effusion, no ecchymosis, no deformity, no erythema and normal alignment. tenderness found. Medial joint line and lateral joint line tenderness noted. No patellar tendon tenderness noted.  Neurological: He is alert and oriented to person, place, and time. No sensory deficit.  Skin: Skin is warm, dry and intact.    ED Course  Procedures (including critical care time)  Labs Reviewed - No data to display Dg Knee Complete 4 Views Right  01/13/2011  *RADIOLOGY REPORT*  Clinical Data: Larey Seat, hit while working.  Severe right knee pain.  RIGHT KNEE - COMPLETE 4+ VIEW  Comparison: Right knee radiographs 05/14/2010  Findings: Cross-table lateral view of the right knee demonstrates normal alignment.  No evidence of joint effusion.  Bony mineralization is normal.  Joint spaces are maintained.  No acute fracture is seen. Stable tiny osteochondroma arising from the lateral distal right femoral metaphysis.  There is suggestion of soft tissue swelling medially the level of the distal femur.  IMPRESSION:  1.  No acute bony abnormality or suprapatellar joint effusion. 2.  Question soft tissue swelling medially. 3.  Stable tiny osteochondroma distal right femur.  Original Report Authenticated By: Britta Mccreedy, M.D.     No diagnosis found.    MDM  Right knee sprain.  Referral to Dr Ranell Patrick for followup care.  Ice,  Elevation,  Knee immobilizer,  Crutches.  Percocet ,  Ibuprofen.        Candis Musa, PA 01/13/11 [phone]

## 2011-05-30 ENCOUNTER — Emergency Department (HOSPITAL_COMMUNITY)
Admission: EM | Admit: 2011-05-30 | Discharge: 2011-05-30 | Disposition: A | Discharge status: Home / Self Care | Payer: Managed Care, Other (non HMO) | Attending: Emergency Medicine | Admitting: Emergency Medicine

## 2011-05-30 ENCOUNTER — Emergency Department (HOSPITAL_COMMUNITY): Payer: Managed Care, Other (non HMO)

## 2011-05-30 ENCOUNTER — Encounter (HOSPITAL_COMMUNITY): Payer: Self-pay

## 2011-05-30 DIAGNOSIS — M79609 Pain in unspecified limb: Secondary | ICD-10-CM | POA: Insufficient documentation

## 2011-05-30 DIAGNOSIS — J45909 Unspecified asthma, uncomplicated: Secondary | ICD-10-CM | POA: Insufficient documentation

## 2011-05-30 DIAGNOSIS — I10 Essential (primary) hypertension: Secondary | ICD-10-CM | POA: Insufficient documentation

## 2011-05-30 DIAGNOSIS — W298XXA Contact with other powered powered hand tools and household machinery, initial encounter: Secondary | ICD-10-CM | POA: Insufficient documentation

## 2011-05-30 DIAGNOSIS — S61409A Unspecified open wound of unspecified hand, initial encounter: Secondary | ICD-10-CM | POA: Insufficient documentation

## 2011-05-30 MED ORDER — OXYCODONE-ACETAMINOPHEN 5-325 MG PO TABS
1.0000 | ORAL_TABLET | Freq: Once | ORAL | Status: AC
  Administered 2011-05-30: 1 via ORAL
  Filled 2011-05-30: qty 1

## 2011-05-30 MED ORDER — CEPHALEXIN 500 MG PO CAPS: 500.0000 mg | ORAL_CAPSULE | Freq: Four times a day (QID) | ORAL | Status: AC

## 2011-05-30 MED ORDER — ONDANSETRON 8 MG PO TBDP
8.0000 mg | ORAL_TABLET | Freq: Once | ORAL | Status: AC
  Administered 2011-05-30: 8 mg via ORAL
  Filled 2011-05-30: qty 1

## 2011-05-30 MED ORDER — TETANUS-DIPHTH-ACELL PERTUSSIS 5-2.5-18.5 LF-MCG/0.5 IM SUSP
INTRAMUSCULAR | Status: AC
  Administered 2011-05-30: 0.5 mL via INTRAMUSCULAR
  Filled 2011-05-30: qty 0.5

## 2011-05-30 MED ORDER — OXYCODONE-ACETAMINOPHEN 5-325 MG PO TABS: 1.0000 | ORAL_TABLET | ORAL | Status: AC | PRN

## 2011-05-30 MED ORDER — LIDOCAINE HCL (PF) 1 % IJ SOLN
INTRAMUSCULAR | Status: AC
  Administered 2011-05-30: 5 mL
  Filled 2011-05-30: qty 5

## 2011-05-30 MED ORDER — CEPHALEXIN 500 MG PO CAPS
500.0000 mg | ORAL_CAPSULE | Freq: Once | ORAL | Status: AC
  Administered 2011-05-30: 500 mg via ORAL
  Filled 2011-05-30: qty 1

## 2011-05-30 MED ORDER — BACITRACIN ZINC 500 UNIT/GM EX OINT
TOPICAL_OINTMENT | CUTANEOUS | Status: AC
  Administered 2011-05-30: 21:00:00
  Filled 2011-05-30: qty 0.9

## 2011-05-30 NOTE — ED Provider Notes (Signed)
2040 Spoke with Dr. Melvyn Novas, orthopedist on call for hand. He recommended, irrigate , loosely tack skin together.  Dress and place in a splint.Patient to call the office tomorrow for an appointment.Patient has received tetanus. Will be placed on Keflex.  Nicoletta Dress. Colon Branch, MD 05/30/11 2043

## 2011-05-30 NOTE — ED Notes (Signed)
Pt has laceration to left thumb from saw at home while working. Bleeding controlled at time of arrival.

## 2011-05-30 NOTE — ED Notes (Signed)
Culture was ordered  By mistake.  No culture was done.

## 2011-05-30 NOTE — ED Notes (Signed)
D/c with 6 pack of percocet.

## 2011-05-30 NOTE — ED Notes (Signed)
Became nauseated, med given.

## 2011-05-30 NOTE — ED Provider Notes (Signed)
History     CSN: [id number]  Arrival date & time 05/30/11  1478   First MD Initiated Contact with Patient 05/30/11 1832      Chief Complaint  Patient presents with  . Extremity Laceration    (Consider location/radiation/quality/duration/timing/severity/associated sxs/prior treatment) HPI Comments: Patient complains of a laceration to the base of his left thumb that occurred while he was using a reciprocating saw.  He states he was not wearing gloves at the time of the injury.  He complains of pain with movement of the thumb or left index finger.  He denies known foreign body.  He also states that he occasionally takes aspirin. Denies numbness, pain to the wrist or other injuries.  He is right-hand dominant.  Patient is a 26 y.o. male presenting with skin laceration. The history is provided by the patient. No language interpreter was used.  Laceration  The incident occurred 1 to 2 hours ago. The laceration is located on the left hand. The laceration is 3 cm in size. Injury mechanism: a reciprocating saw. The pain is moderate. The pain has been constant since onset. He reports no foreign bodies present. His tetanus status is unknown.    Past Medical History  Diagnosis Date  . Hypertension   . Chronic back pain   . Asthma     Past Surgical History  Procedure Date  . Hernia repair     Family History  Problem Relation Age of Onset  . Hypertension Mother   . Seizures Mother   . Cancer Brother     History  Substance Use Topics  . Smoking status: Current Some Day Smoker    Types: Cigarettes  . Smokeless tobacco: Not on file  . Alcohol Use: No      Review of Systems  Musculoskeletal: Positive for joint swelling and arthralgias.  Skin: Positive for wound.  Neurological: Negative for weakness and numbness.  Hematological: Does not bruise/bleed easily.  All other systems reviewed and are negative.    Allergies  Peanuts; Shellfish allergy; Flexeril; and  Penicillins  Home Medications   Current Outpatient Rx  Name Route Sig Dispense Refill  . ALBUTEROL SULFATE HFA 108 (90 BASE) MCG/ACT IN AERS Inhalation Inhale 2 puffs into the lungs every 6 (six) hours as needed. For shortness of breath    . VITAMIN C PO Oral Take 1 tablet by mouth daily.      [name] MONTELUKAST SODIUM 10 MG PO TABS Oral Take 10 mg by mouth at bedtime. For asthma    . NAPROXEN SODIUM 220 MG PO TABS Oral Take 220 mg by mouth as needed. For pain    . PROPRANOLOL HCL 80 MG PO TABS Oral Take 40 mg by mouth 2 (two) times daily.       BP 140/88  Pulse 72  Temp(Src) 97.9 F (36.6 C) (Oral)  Resp 20  Ht 6\' 3"  (1.905 m)  Wt 220 lb (99.791 kg)  BMI 27.50 kg/m2  SpO2 96%  Physical Exam  Nursing note and vitals reviewed. Constitutional: He appears well-developed and well-nourished. No distress.  HENT:  Head: Normocephalic and atraumatic.  Cardiovascular: Normal rate, regular rhythm and normal heart sounds.   No murmur heard. Pulmonary/Chest: Effort normal and breath sounds normal.  Musculoskeletal: He exhibits edema and tenderness.       Left hand: He exhibits decreased range of motion, tenderness, laceration and swelling. He exhibits no bony tenderness, normal two-point discrimination, normal capillary refill and no deformity. decreased sensation noted. Decreased  strength noted. He exhibits thumb/finger opposition.       Hands:      3 cm laceration to the proximal left thumb and web space. Bleeding controlled.  Mild to moderate soft tissue swelling of the thenar eminence.  Flexion of the left index finger and thumb are decreased.  Patient is unable to perform finger/thumb opposition. No wrist tenderness, cap refill less than 2 seconds  Neurological: He is alert. He exhibits normal muscle tone. Coordination normal.  Skin:       See MS exam    ED Course  Procedures (including critical care time)   Dg Hand Complete Left  05/30/2011  *RADIOLOGY REPORT*  Clinical Data:  Laceration to the posterior and medial left thumb.  LEFT HAND - COMPLETE 3+ VIEW  Comparison: Left hand films 10/10/2010.  Findings: There is some soft tissue swelling over the thenar eminence.  No underlying fracture is present.  There is no radiopaque foreign body.  The hand is otherwise unremarkable.  IMPRESSION: Soft tissue swelling along the thenar eminence without underlying fracture or radiopaque foreign body.  Original Report Authenticated By: Jamesetta Orleans. MATTERN, M.D.     LACERATION REPAIR Performed by: Maxwell Caul. Authorized by: Maxwell Caul Consent: Verbal consent obtained. Risks and benefits: risks, benefits and alternatives were discussed Consent given by: patient Patient identity confirmed: provided demographic data Prepped and Draped in normal sterile fashion Wound explored  Laceration Location: proximal left thumb and web space Laceration Length: 3cm  No Foreign Bodies seen or palpated  Anesthesia: local infiltration  Local anesthetic: lidocaine1% w/o epinephrine  Anesthetic total: 3 ml  Irrigation method: syringe Amount of cleaning: standard  Skin closure: skin loosely approximated  Number of sutures: 3 Technique: simple interrupted  Patient tolerance: Patient tolerated the procedure well with no immediate complications.    MDM    Patient has a deep puncture type laceration to the proximal left thumb and web space with Moderate soft tissue swelling of the thenar eminence and he is unable to perform thumb/ finger opposition or flex the thumb.  Likely tendon injury. Wound was irrigated and skin loosely approximated.  I will prescribe pain medication and Keflex.  Wound was bandaged, and thumb spica splint applied, he agrees to close followup with Dr. Melvyn Novas this week  Patient was examined by the emergency room physician prior to wound closure and the hand surgeon, Dr. Melvyn Novas, was consulted by the EDP.    Pt feels improved after observation  and/or treatment in ED. Patient / Family / Caregiver understand and agree with initial ED impression and plan with expectations set for ED visit. Pt stable in ED with no significant deterioration in condition.     Ellanora Rayborn L. [place], [place] 05/31/11 [id number]

## 2011-05-30 NOTE — ED Notes (Signed)
11/4 " lac to base of lt thumb, cut with saw

## 2011-06-06 MED FILL — Oxycodone w/ Acetaminophen Tab 5-325 MG: ORAL | Qty: 6 | Status: AC

## 2011-09-14 ENCOUNTER — Encounter (HOSPITAL_COMMUNITY): Payer: Self-pay | Admitting: Emergency Medicine

## 2011-09-14 ENCOUNTER — Emergency Department (HOSPITAL_COMMUNITY): Payer: Managed Care, Other (non HMO)

## 2011-09-14 ENCOUNTER — Emergency Department (HOSPITAL_COMMUNITY)
Admission: EM | Admit: 2011-09-14 | Discharge: 2011-09-14 | Disposition: A | Discharge status: Home / Self Care | Payer: Managed Care, Other (non HMO) | Attending: Emergency Medicine | Admitting: Emergency Medicine

## 2011-09-14 DIAGNOSIS — Z79899 Other long term (current) drug therapy: Secondary | ICD-10-CM | POA: Insufficient documentation

## 2011-09-14 DIAGNOSIS — S6990XA Unspecified injury of unspecified wrist, hand and finger(s), initial encounter: Secondary | ICD-10-CM | POA: Insufficient documentation

## 2011-09-14 DIAGNOSIS — M7989 Other specified soft tissue disorders: Secondary | ICD-10-CM | POA: Insufficient documentation

## 2011-09-14 DIAGNOSIS — S60221A Contusion of right hand, initial encounter: Secondary | ICD-10-CM

## 2011-09-14 DIAGNOSIS — M79609 Pain in unspecified limb: Secondary | ICD-10-CM | POA: Insufficient documentation

## 2011-09-14 DIAGNOSIS — I10 Essential (primary) hypertension: Secondary | ICD-10-CM | POA: Insufficient documentation

## 2011-09-14 DIAGNOSIS — W208XXA Other cause of strike by thrown, projected or falling object, initial encounter: Secondary | ICD-10-CM | POA: Insufficient documentation

## 2011-09-14 DIAGNOSIS — G8929 Other chronic pain: Secondary | ICD-10-CM | POA: Insufficient documentation

## 2011-09-14 DIAGNOSIS — J45909 Unspecified asthma, uncomplicated: Secondary | ICD-10-CM | POA: Insufficient documentation

## 2011-09-14 DIAGNOSIS — S60229A Contusion of unspecified hand, initial encounter: Secondary | ICD-10-CM | POA: Insufficient documentation

## 2011-09-14 DIAGNOSIS — M549 Dorsalgia, unspecified: Secondary | ICD-10-CM | POA: Insufficient documentation

## 2011-09-14 DIAGNOSIS — Y92009 Unspecified place in unspecified non-institutional (private) residence as the place of occurrence of the external cause: Secondary | ICD-10-CM | POA: Insufficient documentation

## 2011-09-14 MED ORDER — MELOXICAM 7.5 MG PO TABS: ORAL_TABLET | ORAL | Status: DC

## 2011-09-14 MED ORDER — HYDROCODONE-ACETAMINOPHEN 5-325 MG PO TABS: 1.0000 | ORAL_TABLET | ORAL | Status: AC | PRN

## 2011-09-14 NOTE — Discharge Instructions (Signed)
Apply ice. Use mobic two times daily with food. Use Norco every 4 hours for pain if needed. This medication may cause drowsiness, use with caution. Please see MD at [facility] if not improving. Please use the sling for the next 5 to 7 days.

## 2011-09-14 NOTE — ED Provider Notes (Signed)
Medical screening examination/treatment/procedure(s) were performed by non-physician practitioner and as supervising physician I was immediately available for consultation/collaboration.  Emmauel Hallums R. Leevi Cullars, MD 09/14/11 1502 

## 2011-09-14 NOTE — ED Notes (Signed)
Pt states a washing machine on the left hand. Pt states he cannot move the hand.

## 2011-09-14 NOTE — ED Provider Notes (Signed)
History     CSN: [id number]  Arrival date & time 09/14/11  1324   First MD Initiated Contact with Patient 09/14/11 1021      Chief Complaint  Patient presents with  . Hand Injury    (Consider location/radiation/quality/duration/timing/severity/associated sxs/prior treatment) Patient is a 26 y.o. male presenting with hand injury. The history is provided by the patient.  Hand Injury  The incident occurred 1 to 2 hours ago. The incident occurred at home. Injury mechanism: Washer fell on right hand today. The pain is present in the left hand. The quality of the pain is described as aching. The pain is moderate. The pain has been constant since the incident. Pertinent negatives include no fever. The symptoms are aggravated by movement, use and palpation. He has tried nothing for the symptoms.    Past Medical History  Diagnosis Date  . Hypertension   . Chronic back pain   . Asthma     Past Surgical History  Procedure Date  . Hernia repair   . Thumb surgery     Family History  Problem Relation Age of Onset  . Hypertension Mother   . Seizures Mother   . Cancer Brother     History  Substance Use Topics  . Smoking status: Current Some Day Smoker    Types: Cigarettes  . Smokeless tobacco: Not on file  . Alcohol Use: No      Review of Systems  Constitutional: Negative for fever and activity change.       All ROS Neg except as noted in HPI  HENT: Negative for nosebleeds and neck pain.   Eyes: Negative for photophobia and discharge.  Respiratory: Positive for wheezing. Negative for cough and shortness of breath.   Cardiovascular: Negative for chest pain and palpitations.  Gastrointestinal: Negative for abdominal pain and blood in stool.  Genitourinary: Negative for dysuria, frequency and hematuria.  Musculoskeletal: Positive for back pain. Negative for arthralgias.  Skin: Negative.   Neurological: Negative for dizziness, seizures and speech difficulty.    Psychiatric/Behavioral: Negative for hallucinations and confusion.    Allergies  Peanuts; Penicillins; Shellfish allergy; and Flexeril  Home Medications   Current Outpatient Rx  Name Route Sig Dispense Refill  . ALBUTEROL SULFATE HFA 108 (90 BASE) MCG/ACT IN AERS Inhalation Inhale 2 puffs into the lungs every 6 (six) hours as needed. For shortness of breath    . VITAMIN C PO Oral Take 1 tablet by mouth daily.      [name] EPINEPHRINE 0.15 MG/0.3ML IJ DEVI Intramuscular Inject 0.15 mg into the muscle as needed. For anaphylaxis    . MONTELUKAST SODIUM 10 MG PO TABS Oral Take 10 mg by mouth at bedtime. For asthma    . PROPRANOLOL HCL 80 MG PO TABS Oral Take 40 mg by mouth 2 (two) times daily.     . TRAMADOL HCL 50 MG PO TABS Oral Take 50 mg by mouth every 6 (six) hours as needed. For pain    . HYDROCODONE-ACETAMINOPHEN 5-325 MG PO TABS Oral Take 1 tablet by mouth every 4 (four) hours as needed for pain. 15 tablet 0  . MELOXICAM 7.5 MG PO TABS  1 po bid with food 12 tablet 0    BP 137/97  Pulse 68  Temp(Src) 98 F (36.7 C) (Oral)  Resp 20  Ht 6\' 2"  (1.88 m)  Wt 213 lb (96.616 kg)  BMI 27.35 kg/m2  SpO2 99%  Physical Exam  Nursing note and vitals reviewed. Constitutional: He  is oriented to person, place, and time. He appears well-developed and well-nourished.  Non-toxic appearance.  HENT:  Head: Normocephalic.  Right Ear: Tympanic membrane and external ear normal.  Left Ear: Tympanic membrane and external ear normal.  Eyes: EOM and lids are normal. Pupils are equal, round, and reactive to light.  Neck: Normal range of motion. Neck supple. Carotid bruit is not present.  Cardiovascular: Normal rate, regular rhythm, normal heart sounds, intact distal pulses and normal pulses.   Pulmonary/Chest: Breath sounds normal. No respiratory distress.  Abdominal: Soft. Bowel sounds are normal. There is no tenderness. There is no guarding.  Musculoskeletal: Normal range of motion.       Mild to  mod swelling of the dorsum of the right hand. Pain with attempted ROM of the  Fingers and wrist. Good cap refill and sensory. Well healed surgical scar at the dorsum of the right thumb.  Lymphadenopathy:       Head (right side): No submandibular adenopathy present.       Head (left side): No submandibular adenopathy present.    He has no cervical adenopathy.  Neurological: He is alert and oriented to person, place, and time. He has normal strength. No cranial nerve deficit or sensory deficit.  Skin: Skin is warm and dry.  Psychiatric: He has a normal mood and affect. His speech is normal.    ED Course  Procedures (including critical care time)  Labs Reviewed - No data to display Dg Hand Complete Left  09/14/2011  *RADIOLOGY REPORT*  Clinical Data: Pain across metacarpals after injury.  LEFT HAND - COMPLETE 3+ VIEW  Comparison: 05/30/2011.  Findings: No acute osseous or joint abnormality.  IMPRESSION: No acute osseous or joint abnormality.  Original Report Authenticated By: Reyes Ivan, M.D.     1. Contusion, hand, right, initial encounter       MDM  I have reviewed nursing notes, vital signs, and all appropriate lab and imaging results for this patient. Right hand xray is negative for fracture or dislocation. Lenora Boys Splint applied. Rx for Norco,# 15 and  Mobic, given to the patient. Patient  To see [facility] for eval, if not improving.       Kathie Dike, [place] 09/14/11 1121

## 2012-12-23 ENCOUNTER — Encounter (HOSPITAL_COMMUNITY): Payer: Self-pay

## 2012-12-23 ENCOUNTER — Emergency Department (HOSPITAL_COMMUNITY): Payer: Self-pay

## 2012-12-23 ENCOUNTER — Emergency Department (HOSPITAL_COMMUNITY)
Admission: EM | Admit: 2012-12-23 | Discharge: 2012-12-23 | Disposition: A | Discharge status: Home / Self Care | Payer: Self-pay | Attending: Emergency Medicine | Admitting: Emergency Medicine

## 2012-12-23 DIAGNOSIS — M549 Dorsalgia, unspecified: Secondary | ICD-10-CM | POA: Insufficient documentation

## 2012-12-23 DIAGNOSIS — R109 Unspecified abdominal pain: Secondary | ICD-10-CM | POA: Insufficient documentation

## 2012-12-23 DIAGNOSIS — R319 Hematuria, unspecified: Secondary | ICD-10-CM | POA: Insufficient documentation

## 2012-12-23 DIAGNOSIS — Z79899 Other long term (current) drug therapy: Secondary | ICD-10-CM | POA: Insufficient documentation

## 2012-12-23 DIAGNOSIS — Z9889 Other specified postprocedural states: Secondary | ICD-10-CM | POA: Insufficient documentation

## 2012-12-23 DIAGNOSIS — R300 Dysuria: Secondary | ICD-10-CM | POA: Insufficient documentation

## 2012-12-23 DIAGNOSIS — F17200 Nicotine dependence, unspecified, uncomplicated: Secondary | ICD-10-CM | POA: Insufficient documentation

## 2012-12-23 DIAGNOSIS — R339 Retention of urine, unspecified: Secondary | ICD-10-CM | POA: Insufficient documentation

## 2012-12-23 DIAGNOSIS — G8929 Other chronic pain: Secondary | ICD-10-CM | POA: Insufficient documentation

## 2012-12-23 DIAGNOSIS — I10 Essential (primary) hypertension: Secondary | ICD-10-CM | POA: Insufficient documentation

## 2012-12-23 DIAGNOSIS — J45909 Unspecified asthma, uncomplicated: Secondary | ICD-10-CM | POA: Insufficient documentation

## 2012-12-23 DIAGNOSIS — Z880 Allergy status to penicillin: Secondary | ICD-10-CM | POA: Insufficient documentation

## 2012-12-23 DIAGNOSIS — R110 Nausea: Secondary | ICD-10-CM | POA: Insufficient documentation

## 2012-12-23 LAB — URINALYSIS, ROUTINE W REFLEX MICROSCOPIC
Bilirubin Urine: NEGATIVE
Glucose, UA: NEGATIVE mg/dL
Hgb urine dipstick: NEGATIVE
Ketones, ur: NEGATIVE mg/dL
Leukocytes, UA: NEGATIVE
Nitrite: NEGATIVE
Protein, ur: NEGATIVE mg/dL
Specific Gravity, Urine: 1.01 (ref 1.005–1.030)
Urobilinogen, UA: 0.2 mg/dL (ref 0.0–1.0)
pH: 7 (ref 5.0–8.0)

## 2012-12-23 MED ORDER — OXYCODONE-ACETAMINOPHEN 5-325 MG PO TABS: 2.0000 | ORAL_TABLET | ORAL | Status: DC | PRN

## 2012-12-23 MED ORDER — HYDROMORPHONE HCL PF 1 MG/ML IJ SOLN
1.0000 mg | Freq: Once | INTRAMUSCULAR | Status: AC
Start: 2012-12-23 — End: 2012-12-23
  Administered 2012-12-23: 1 mg via INTRAVENOUS
  Filled 2012-12-23: qty 1

## 2012-12-23 MED ORDER — MORPHINE SULFATE 4 MG/ML IJ SOLN
4.0000 mg | Freq: Once | INTRAMUSCULAR | Status: AC
  Administered 2012-12-23: 4 mg via INTRAVENOUS
  Filled 2012-12-23: qty 1

## 2012-12-23 MED ORDER — ONDANSETRON HCL 4 MG/2ML IJ SOLN
4.0000 mg | Freq: Once | INTRAMUSCULAR | Status: AC
  Administered 2012-12-23: 4 mg via INTRAVENOUS
  Filled 2012-12-23: qty 2

## 2012-12-23 MED ORDER — SODIUM CHLORIDE 0.9 % IV BOLUS (SEPSIS)
500.0000 mL | Freq: Once | INTRAVENOUS | Status: AC
  Administered 2012-12-23: 500 mL via INTRAVENOUS

## 2012-12-23 MED ORDER — KETOROLAC TROMETHAMINE 30 MG/ML IJ SOLN
30.0000 mg | Freq: Once | INTRAMUSCULAR | Status: AC
  Administered 2012-12-23: 30 mg via INTRAVENOUS
  Filled 2012-12-23: qty 1

## 2012-12-23 MED ORDER — PROMETHAZINE HCL 25 MG PO TABS: 25.0000 mg | ORAL_TABLET | Freq: Four times a day (QID) | ORAL | Status: DC | PRN

## 2012-12-23 NOTE — ED Notes (Signed)
Pt has been having left flank pain off/on for 2 weeks, +blood in urine today, +nausea, left flank pain.

## 2012-12-23 NOTE — ED Notes (Signed)
Pt states pain has improved, pt up to bedside with no difficulty or discomfort voiced at this time.

## 2012-12-23 NOTE — ED Provider Notes (Signed)
CSN: [id number]     Arrival date & time 12/23/12  1007 History   First MD Initiated Contact with Patient 12/23/12 1031     Chief Complaint  Patient presents with  . Flank Pain  . Hematuria   (Consider location/radiation/quality/duration/timing/severity/associated sxs/prior Treatment) HPI  Past Medical History  Diagnosis Date  . Hypertension   . Chronic back pain   . Asthma    Past Surgical History  Procedure Laterality Date  . Hernia repair    . Thumb surgery     Family History  Problem Relation Age of Onset  . Hypertension Mother   . Seizures Mother   . Cancer Brother    History  Substance Use Topics  . Smoking status: Current Some Day Smoker    Types: Cigarettes  . Smokeless tobacco: Not on file  . Alcohol Use: No    Review of Systems  Allergies  Peanuts; Penicillins; Shellfish allergy; and Flexeril  Home Medications   Current Outpatient Rx  Name  Route  Sig  Dispense  Refill  . albuterol (PROAIR HFA) 108 (90 BASE) MCG/ACT inhaler   Inhalation   Inhale 2 puffs into the lungs every 6 (six) hours as needed. For shortness of breath         . EPINEPHrine (EPIPEN JR) 0.15 MG/0.3ML injection   Intramuscular   Inject 0.15 mg into the muscle as needed. For anaphylaxis         . ibuprofen (ADVIL,MOTRIN) 200 MG tablet   Oral   Take 800 mg by mouth every 6 (six) hours as needed for pain.         . montelukast (SINGULAIR) 10 MG tablet   Oral   Take 10 mg by mouth at bedtime. For asthma         . propranolol (INDERAL) 80 MG tablet   Oral   Take 80 mg by mouth daily.          . traMADol (ULTRAM) 50 MG tablet   Oral   Take 50 mg by mouth every 6 (six) hours as needed. For pain         . oxyCODONE-acetaminophen (PERCOCET) 5-325 MG per tablet   Oral   Take 2 tablets by mouth every 4 (four) hours as needed for pain.   20 tablet   0   . promethazine (PHENERGAN) 25 MG tablet   Oral   Take 1 tablet (25 mg total) by mouth every 6 (six) hours as  needed for nausea.   20 tablet   0    BP 127/79  Pulse 67  Temp(Src) 98.3 F (36.8 C) (Oral)  Resp 18  Ht 6\' 1"  (1.854 m)  Wt 210 lb (95.255 kg)  BMI 27.71 kg/m2  SpO2 97% Physical Exam  ED Course  Procedures (including critical care time) Labs Review Labs Reviewed  URINALYSIS, ROUTINE W REFLEX MICROSCOPIC   Imaging Review Ct Abdomen Pelvis Wo Contrast  12/23/2012   *RADIOLOGY REPORT*  Clinical Data:  Left-sided flank pain.  Rule out kidney stone.  CT ABDOMEN AND PELVIS WITHOUT CONTRAST (CT UROGRAM)  Technique: Contiguous axial images of the abdomen and pelvis without oral or intravenous contrast were obtained.  Comparison: None  Findings:  Exam is limited for evaluation of entities other than urinary tract calculi due to lack of oral or intravenous contrast.   Lung bases:  4 mm right lower lobe lung nodule on image 6/series 3.  Likely post infectious or inflammatory, given the patient age. Normal  heart size without pericardial or pleural effusion.  Abdomen/pelvis:  Normal uninfused appearance of the liver, spleen, stomach, pancreas, gallbladder, biliary tree, adrenal glands.  No renal calculi or hydronephrosis.  No hydroureter or ureteric calculi.  No retroperitoneal or retrocrural adenopathy.  Colonic stool burden suggests constipation.  Normal terminal ileum and appendix.  Normal small bowel without abdominal ascites.    No pelvic adenopathy.    Normal urinary bladder and prostate.  No significant free fluid.  Bones/Musculoskeletal:  No acute osseous abnormality.  IMPRESSION: 1. No urinary tract calculi or hydronephrosis. 2. Possible constipation.   Original Report Authenticated By: Jeronimo Greaves, M.D.    MDM   1. Left flank pain    Urinalysis and CT scan show no evidence of a kidney stone. Vital signs are stable.  Pain much improved. Discharge meds Percocet and Phenergan 25 mg. Patient will return if symptoms worsen    I personally performed the services described in this  documentation, which was scribed in my presence. The recorded information has been reviewed and is accurate.     Donnetta Hutching, MD 12/23/12 [phone]

## 2012-12-23 NOTE — ED Provider Notes (Signed)
CSN: [id number]     Arrival date & time 12/23/12  1007 History   This chart was scribed for Donnetta Hutching, MD, by Yevette Edwards, ED Scribe. This patient was seen in room APA08/APA08 and the patient's care was started at 10:39 AM. First MD Initiated Contact with Patient 12/23/12 1031     Chief Complaint  Patient presents with  . Flank Pain  . Hematuria   (Consider location/radiation/quality/duration/timing/severity/associated sxs/prior Treatment) The history is provided by the patient. No language interpreter was used.   HPI Comments: Eric Lowery is a 27 y.o. male who presents to the Emergency Department complaining of acute, left-sided flank pain which has been intermittent for approximately two weeks, but which severely increased this morning and awakened him from his sleep. The pt describes the pain as "sharp," and he rates his pain as 10/10.  As associated symptoms, he has also experienced nausea, dysuria, urinating only small amounts, and one episode of hematuria today. He denies a h/o kidney calculi, but he states that he has family members who have had nephrolithiasis.   Past Medical History  Diagnosis Date  . Hypertension   . Chronic back pain   . Asthma    Past Surgical History  Procedure Laterality Date  . Hernia repair    . Thumb surgery     Family History  Problem Relation Age of Onset  . Hypertension Mother   . Seizures Mother   . Cancer Brother    History  Substance Use Topics  . Smoking status: Current Some Day Smoker    Types: Cigarettes  . Smokeless tobacco: Not on file  . Alcohol Use: No    Review of Systems  Constitutional: Negative for fever and chills.  Gastrointestinal: Positive for nausea.  Genitourinary: Positive for dysuria, hematuria, flank pain and difficulty urinating.  All other systems reviewed and are negative.   Allergies  Peanuts; Penicillins; Shellfish allergy; and Flexeril  Home Medications   Current Outpatient Rx  Name  Route   Sig  Dispense  Refill  . albuterol (PROAIR HFA) 108 (90 BASE) MCG/ACT inhaler   Inhalation   Inhale 2 puffs into the lungs every 6 (six) hours as needed. For shortness of breath         . Ascorbic Acid (VITAMIN C PO)   Oral   Take 1 tablet by mouth daily.           [name] EPINEPHrine (EPIPEN JR) 0.15 MG/0.3ML injection   Intramuscular   Inject 0.15 mg into the muscle as needed. For anaphylaxis         . meloxicam (MOBIC) 7.5 MG tablet      1 po bid with food   12 tablet   0   . montelukast (SINGULAIR) 10 MG tablet   Oral   Take 10 mg by mouth at bedtime. For asthma         . propranolol (INDERAL) 80 MG tablet   Oral   Take 40 mg by mouth 2 (two) times daily.          . traMADol (ULTRAM) 50 MG tablet   Oral   Take 50 mg by mouth every 6 (six) hours as needed. For pain          Triage Vitals: BP 121/102  Pulse 81  Temp(Src) 98.3 F (36.8 C) (Oral)  Resp 20  Ht 6\' 1"  (1.854 m)  Wt 210 lb (95.255 kg)  BMI 27.71 kg/m2  SpO2 100%  Physical Exam  Nursing note and vitals reviewed. Constitutional: He is oriented to person, place, and time. He appears well-developed and well-nourished.  HENT:  Head: Normocephalic and atraumatic.  Eyes: Conjunctivae and EOM are normal. Pupils are equal, round, and reactive to light.  Neck: Normal range of motion. Neck supple.  Cardiovascular: Normal rate, regular rhythm and normal heart sounds.   Pulmonary/Chest: Effort normal and breath sounds normal.  Abdominal: Soft. Bowel sounds are normal.  Musculoskeletal: Normal range of motion.  Neurological: He is alert and oriented to person, place, and time.  Skin: Skin is warm and dry.  Psychiatric: He has a normal mood and affect.    ED Course  Procedures (including critical care time)  DIAGNOSTIC STUDIES: Oxygen Saturation is 100% on room air, normal by my interpretation.    COORDINATION OF CARE:  10:42 AM- Discussed treatment plan with patient, which includes IV pain  medication and a CT scan, and the patient agreed to the plan.    Labs Review Labs Reviewed  URINALYSIS, ROUTINE W REFLEX MICROSCOPIC   Imaging Review Ct Abdomen Pelvis Wo Contrast  12/23/2012   *RADIOLOGY REPORT*  Clinical Data:  Left-sided flank pain.  Rule out kidney stone.  CT ABDOMEN AND PELVIS WITHOUT CONTRAST (CT UROGRAM)  Technique: Contiguous axial images of the abdomen and pelvis without oral or intravenous contrast were obtained.  Comparison: None  Findings:  Exam is limited for evaluation of entities other than urinary tract calculi due to lack of oral or intravenous contrast.   Lung bases:  4 mm right lower lobe lung nodule on image 6/series 3.  Likely post infectious or inflammatory, given the patient age. Normal heart size without pericardial or pleural effusion.  Abdomen/pelvis:  Normal uninfused appearance of the liver, spleen, stomach, pancreas, gallbladder, biliary tree, adrenal glands.  No renal calculi or hydronephrosis.  No hydroureter or ureteric calculi.  No retroperitoneal or retrocrural adenopathy.  Colonic stool burden suggests constipation.  Normal terminal ileum and appendix.  Normal small bowel without abdominal ascites.    No pelvic adenopathy.    Normal urinary bladder and prostate.  No significant free fluid.  Bones/Musculoskeletal:  No acute osseous abnormality.  IMPRESSION: 1. No urinary tract calculi or hydronephrosis. 2. Possible constipation.   Original Report Authenticated By: Jeronimo Greaves, M.D.    MDM  No diagnosis found. Urinalysis and CT scan show no evidence of kidney stone.   Patient feels better after pain management. Discharge meds Percocet, Phenergan 25 mg.  discussed test results with patient  I personally performed the services described in this documentation, which was scribed in my presence. The recorded information has been reviewed and is accurate.     Donnetta Hutching, MD 12/23/12 [phone]

## 2014-08-11 ENCOUNTER — Emergency Department (HOSPITAL_COMMUNITY)
Admission: EM | Admit: 2014-08-11 | Discharge: 2014-08-12 | Disposition: A | Discharge status: Home / Self Care | Payer: 59 | Attending: Emergency Medicine | Admitting: Emergency Medicine

## 2014-08-11 ENCOUNTER — Encounter (HOSPITAL_COMMUNITY): Payer: Self-pay | Admitting: Emergency Medicine

## 2014-08-11 DIAGNOSIS — R109 Unspecified abdominal pain: Secondary | ICD-10-CM | POA: Diagnosis present

## 2014-08-11 DIAGNOSIS — N410 Acute prostatitis: Secondary | ICD-10-CM | POA: Diagnosis not present

## 2014-08-11 DIAGNOSIS — I10 Essential (primary) hypertension: Secondary | ICD-10-CM | POA: Insufficient documentation

## 2014-08-11 DIAGNOSIS — Z880 Allergy status to penicillin: Secondary | ICD-10-CM | POA: Diagnosis not present

## 2014-08-11 DIAGNOSIS — G8929 Other chronic pain: Secondary | ICD-10-CM | POA: Diagnosis not present

## 2014-08-11 DIAGNOSIS — J45909 Unspecified asthma, uncomplicated: Secondary | ICD-10-CM | POA: Diagnosis not present

## 2014-08-11 DIAGNOSIS — Z720 Tobacco use: Secondary | ICD-10-CM | POA: Insufficient documentation

## 2014-08-11 DIAGNOSIS — Z79899 Other long term (current) drug therapy: Secondary | ICD-10-CM | POA: Diagnosis not present

## 2014-08-11 DIAGNOSIS — Z87442 Personal history of urinary calculi: Secondary | ICD-10-CM | POA: Diagnosis not present

## 2014-08-11 LAB — BASIC METABOLIC PANEL
Anion gap: 4 — ABNORMAL LOW (ref 5–15)
BUN: 20 mg/dL (ref 6–23)
CO2: 29 mmol/L (ref 19–32)
Calcium: 9.5 mg/dL (ref 8.4–10.5)
Chloride: 104 mmol/L (ref 96–112)
Creatinine, Ser: 0.96 mg/dL (ref 0.50–1.35)
GFR calc Af Amer: >90 mL/min (ref >90)
GFR calc non Af Amer: >90 mL/min (ref >90)
Glucose, Bld: 99 mg/dL (ref 70–99)
Potassium: 4.3 mmol/L (ref 3.5–5.1)
Sodium: 137 mmol/L (ref 135–145)

## 2014-08-11 LAB — CBC WITH DIFFERENTIAL/PLATELET
Basophils Absolute: 0 10*3/uL (ref 0.0–0.1)
Basophils Relative: 0 % (ref 0–1)
Eosinophils Absolute: 0.6 10*3/uL (ref 0.0–0.7)
Eosinophils Relative: 5 % (ref 0–5)
HCT: 45.7 % (ref 39.0–52.0)
Hemoglobin: 16 g/dL (ref 13.0–17.0)
Lymphocytes Relative: 22 % (ref 12–46)
Lymphs Abs: 2.6 10*3/uL (ref 0.7–4.0)
MCH: 30.3 pg (ref 26.0–34.0)
MCHC: 35 g/dL (ref 30.0–36.0)
MCV: 86.6 fL (ref 78.0–100.0)
Monocytes Absolute: 0.8 10*3/uL (ref 0.1–1.0)
Monocytes Relative: 7 % (ref 3–12)
Neutro Abs: 7.6 10*3/uL (ref 1.7–7.7)
Neutrophils Relative %: 66 % (ref 43–77)
Platelets: 302 10*3/uL (ref 150–400)
RBC: 5.28 MIL/uL (ref 4.22–5.81)
RDW: 13.1 % (ref 11.5–15.5)
WBC: 11.7 10*3/uL — ABNORMAL HIGH (ref 4.0–10.5)

## 2014-08-11 LAB — URINALYSIS, ROUTINE W REFLEX MICROSCOPIC
Bilirubin Urine: NEGATIVE
Glucose, UA: NEGATIVE mg/dL
Hgb urine dipstick: NEGATIVE
Ketones, ur: NEGATIVE mg/dL
Nitrite: NEGATIVE
Protein, ur: NEGATIVE mg/dL
Specific Gravity, Urine: 1.025 (ref 1.005–1.030)
Urobilinogen, UA: 0.2 mg/dL (ref 0.0–1.0)
pH: 6.5 (ref 5.0–8.0)

## 2014-08-11 LAB — URINE MICROSCOPIC-ADD ON

## 2014-08-11 MED ORDER — HYDROCODONE-ACETAMINOPHEN 5-325 MG PO TABS: 1.0000 | ORAL_TABLET | ORAL | Status: DC | PRN

## 2014-08-11 MED ORDER — LEVOFLOXACIN 250 MG PO TABS: 250.0000 mg | ORAL_TABLET | Freq: Every day | ORAL | Status: DC

## 2014-08-11 MED ORDER — MORPHINE SULFATE 4 MG/ML IJ SOLN
4.0000 mg | Freq: Once | INTRAMUSCULAR | Status: AC
  Administered 2014-08-11: 4 mg via INTRAVENOUS
  Filled 2014-08-11: qty 1

## 2014-08-11 MED ORDER — LEVOFLOXACIN 500 MG PO TABS
500.0000 mg | ORAL_TABLET | Freq: Once | ORAL | Status: AC
  Administered 2014-08-12: 500 mg via ORAL
  Filled 2014-08-11: qty 1

## 2014-08-11 MED ORDER — HYDROCODONE-ACETAMINOPHEN 5-325 MG PO TABS
1.0000 | ORAL_TABLET | Freq: Once | ORAL | Status: AC
  Administered 2014-08-12: 1 via ORAL
  Filled 2014-08-11: qty 1

## 2014-08-11 NOTE — ED Notes (Signed)
Pt reports sudden onset of R flank pain, hematuria and diaphoresis.

## 2014-08-11 NOTE — ED Provider Notes (Signed)
CSN: [id number]     Arrival date & time 08/11/14  1827 History   This chart was scribed for Eric Core, MD by Tanda Rockers, ED Scribe. This patient was seen in room APA10/APA10 and the patient's care was started at 9:59 PM.    Chief Complaint  Patient presents with  . Flank Pain   The history is provided by the patient and the spouse. No language interpreter was used.     HPI Comments:  Eric Lowery is a 29 y.o. male who presents to the Emergency Department complaining of intermittent, gradually worsening right flank pain that began 3 weeks ago, worsening today. Pt states that today upon urinating, the pain became severe and brought him down to his knees. He complains of perineal pain as well that came secondary to flank pain. Pt admits to dysuria, suprapubic pain, and weakened urination that began 3 weeks ago. Pt states that the dysuria has been gradually improving but reports mild hematuria today, causing concern. Pt mentions that while in the waiting room he was sweating. Pt reports hx of kidney stones in the past, diagnosed with CT Scan. He admits to unprotected sex in the last few months with wife.Eric Lowery He denies fever, chills, or any other symptoms.     Past Medical History  Diagnosis Date  . Hypertension   . Chronic back pain   . Asthma    Past Surgical History  Procedure Laterality Date  . Hernia repair    . Thumb surgery     Family History  Problem Relation Age of Onset  . Hypertension Mother   . Seizures Mother   . Cancer Brother    History  Substance Use Topics  . Smoking status: Current Every Day Smoker -- 1.00 packs/day    Types: Cigarettes  . Smokeless tobacco: Current User    Types: Snuff  . Alcohol Use: No    Review of Systems  Constitutional: Positive for diaphoresis. Negative for fever and chills.  HENT: Negative for rhinorrhea.   Respiratory: Negative for cough and shortness of breath.   Cardiovascular: Negative for chest pain.  Gastrointestinal:  Positive for abdominal pain (Suprapubic pain. ). Negative for nausea and vomiting.  Genitourinary: Positive for dysuria, hematuria, flank pain (Right sided flank pain. ) and difficulty urinating.       Positive for perineal pain.   Neurological: Negative for speech difficulty.  Psychiatric/Behavioral: Negative for confusion.      Allergies  Peanuts; Penicillins; Shellfish allergy; and Flexeril  Home Medications   Prior to Admission medications   Medication Sig Start Date End Date Taking? Authorizing Provider  albuterol (PROAIR HFA) 108 (90 BASE) MCG/ACT inhaler Inhale 2 puffs into the lungs every 6 (six) hours as needed. For shortness of breath    Historical Provider, MD  EPINEPHrine (EPIPEN JR) 0.15 MG/0.3ML injection Inject 0.15 mg into the muscle as needed. For anaphylaxis    Historical Provider, MD  HYDROcodone-acetaminophen (NORCO/VICODIN) 5-325 MG per tablet Take 1-2 tablets by mouth every 4 (four) hours as needed. 08/11/14   Eric Core, MD  ibuprofen (ADVIL,MOTRIN) 200 MG tablet Take 800 mg by mouth every 6 (six) hours as needed for pain.    Historical Provider, MD  levofloxacin (LEVAQUIN) 250 MG tablet Take 1 tablet (250 mg total) by mouth daily. 08/11/14   Eric Core, MD  montelukast (SINGULAIR) 10 MG tablet Take 10 mg by mouth at bedtime. For asthma    Historical Provider, MD  oxyCODONE-acetaminophen (PERCOCET) 5-325 MG per tablet  Take 2 tablets by mouth every 4 (four) hours as needed for pain. 12/23/12   Donnetta HutchingBrian Cook, MD  promethazine (PHENERGAN) 25 MG tablet Take 1 tablet (25 mg total) by mouth every 6 (six) hours as needed for nausea. 12/23/12   Donnetta HutchingBrian Cook, MD  propranolol (INDERAL) 80 MG tablet Take 80 mg by mouth daily.     Historical Provider, MD  traMADol (ULTRAM) 50 MG tablet Take 50 mg by mouth every 6 (six) hours as needed. For pain    Historical Provider, MD   Triage Vitals: BP 159/106 mmHg  Pulse 90  Temp(Src) 98.8 F (37.1 C) (Oral)  Resp 14  Ht 6\' 2"  (1.88  m)  Wt 215 lb (97.523 kg)  BMI 27.59 kg/m2  SpO2 99%   Physical Exam  Constitutional: He is oriented to person, place, and time. He appears well-developed and well-nourished. No distress.  HENT:  Head: Normocephalic and atraumatic.  Eyes: Conjunctivae and EOM are normal.  Neck: Neck supple. No tracheal deviation present.  Cardiovascular: Normal rate.   Pulmonary/Chest: Effort normal. No respiratory distress.  Abdominal: Soft. There is no tenderness.  Genitourinary:  Mild right CVA tenderness.  Perineal tenderness.   Musculoskeletal: Normal range of motion.  Neurological: He is alert and oriented to person, place, and time.  Skin: Skin is warm and dry.  Psychiatric: He has a normal mood and affect. His behavior is normal.  Nursing note and vitals reviewed.   ED Course  Procedures (including critical care time)  DIAGNOSTIC STUDIES: Oxygen Saturation is 99% on RA, normal by my interpretation.    COORDINATION OF CARE: 10:11 PM-Discussed treatment plan which includes pain medication, prostate examination, UA with pt at bedside and pt agreed to plan.   Labs Review Labs Reviewed  URINALYSIS, ROUTINE W REFLEX MICROSCOPIC - Abnormal; Notable for the following:    Leukocytes, UA TRACE (*)    All other components within normal limits  CBC WITH DIFFERENTIAL/PLATELET - Abnormal; Notable for the following:    WBC 11.7 (*)    All other components within normal limits  BASIC METABOLIC PANEL - Abnormal; Notable for the following:    Anion gap 4 (*)    All other components within normal limits  URINE MICROSCOPIC-ADD ON - Abnormal; Notable for the following:    Bacteria, UA FEW (*)    All other components within normal limits  URINE CULTURE  RPR  HIV ANTIBODY (ROUTINE TESTING)  GC/CHLAMYDIA PROBE AMP ([place])    Imaging Review No results found.   EKG Interpretation None      MDM   Final diagnoses:  Acute prostatitis    patient with perineal pain dysuria. Likely  prostatitis. Flank pain could be a  Hilar nephritis. Culture sent. Swab sent. Will treat with antibiotics and have patient follow with urology. With further questioning patient has been having some sexual dysfunction also. I personally performed the services described in this documentation, which was scribed in my presence. The recorded information has been reviewed and is accurate.     Eric CoreNathan Garen Woolbright, MD 08/12/14 [phone]

## 2014-08-12 NOTE — ED Notes (Signed)
Patient verbalizes understanding of discharge instructions, prescription medications, home care and follow up care. Patient ambulatory out of department at this time with family. 

## 2014-08-12 NOTE — Discharge Instructions (Signed)

## 2014-08-13 LAB — URINE CULTURE
Colony Count: NO GROWTH
Culture: NO GROWTH

## 2014-08-13 LAB — GC/CHLAMYDIA PROBE AMP (~~LOC~~) NOT AT ARMC
Chlamydia: POSITIVE — AB
Neisseria Gonorrhea: NEGATIVE

## 2014-08-13 LAB — HIV ANTIBODY (ROUTINE TESTING W REFLEX): HIV Screen 4th Generation wRfx: NONREACTIVE

## 2014-08-13 LAB — RPR: RPR Ser Ql: NONREACTIVE

## 2014-08-14 ENCOUNTER — Telehealth (HOSPITAL_BASED_OUTPATIENT_CLINIC_OR_DEPARTMENT_OTHER): Payer: Self-pay | Admitting: Emergency Medicine

## 2014-08-14 NOTE — Telephone Encounter (Signed)
Chart handoff to EDP, for treatment plan for + Chlamydia

## 2014-12-04 ENCOUNTER — Emergency Department (HOSPITAL_COMMUNITY): Payer: 59

## 2014-12-04 ENCOUNTER — Encounter (HOSPITAL_COMMUNITY): Payer: Self-pay | Admitting: Emergency Medicine

## 2014-12-04 ENCOUNTER — Emergency Department (HOSPITAL_COMMUNITY)
Admission: EM | Admit: 2014-12-04 | Discharge: 2014-12-04 | Disposition: A | Discharge status: Home / Self Care | Payer: 59 | Attending: Emergency Medicine | Admitting: Emergency Medicine

## 2014-12-04 DIAGNOSIS — Z720 Tobacco use: Secondary | ICD-10-CM | POA: Insufficient documentation

## 2014-12-04 DIAGNOSIS — G8929 Other chronic pain: Secondary | ICD-10-CM | POA: Insufficient documentation

## 2014-12-04 DIAGNOSIS — Z79899 Other long term (current) drug therapy: Secondary | ICD-10-CM | POA: Insufficient documentation

## 2014-12-04 DIAGNOSIS — R091 Pleurisy: Secondary | ICD-10-CM | POA: Insufficient documentation

## 2014-12-04 DIAGNOSIS — I10 Essential (primary) hypertension: Secondary | ICD-10-CM | POA: Insufficient documentation

## 2014-12-04 DIAGNOSIS — J45909 Unspecified asthma, uncomplicated: Secondary | ICD-10-CM | POA: Insufficient documentation

## 2014-12-04 DIAGNOSIS — Z880 Allergy status to penicillin: Secondary | ICD-10-CM | POA: Insufficient documentation

## 2014-12-04 MED ORDER — ALBUTEROL SULFATE (2.5 MG/3ML) 0.083% IN NEBU
2.5000 mg | INHALATION_SOLUTION | Freq: Once | RESPIRATORY_TRACT | Status: AC
  Administered 2014-12-04: 2.5 mg via RESPIRATORY_TRACT
  Filled 2014-12-04: qty 3

## 2014-12-04 MED ORDER — TRAMADOL HCL 50 MG PO TABS: 50.0000 mg | ORAL_TABLET | Freq: Four times a day (QID) | ORAL | Status: DC | PRN

## 2014-12-04 MED ORDER — PREDNISONE 50 MG PO TABS
60.0000 mg | ORAL_TABLET | Freq: Once | ORAL | Status: AC
  Administered 2014-12-04: 60 mg via ORAL
  Filled 2014-12-04 (×2): qty 1

## 2014-12-04 MED ORDER — IPRATROPIUM-ALBUTEROL 0.5-2.5 (3) MG/3ML IN SOLN
3.0000 mL | Freq: Once | RESPIRATORY_TRACT | Status: AC
  Administered 2014-12-04: 3 mL via RESPIRATORY_TRACT
  Filled 2014-12-04: qty 3

## 2014-12-04 MED ORDER — IBUPROFEN 800 MG PO TABS
800.0000 mg | ORAL_TABLET | Freq: Once | ORAL | Status: DC
  Filled 2014-12-04: qty 1

## 2014-12-04 MED ORDER — AZITHROMYCIN 250 MG PO TABS: ORAL_TABLET | ORAL | Status: DC

## 2014-12-04 MED ORDER — ACETAMINOPHEN 500 MG PO TABS
1000.0000 mg | ORAL_TABLET | Freq: Once | ORAL | Status: AC
  Administered 2014-12-04: 1000 mg via ORAL
  Filled 2014-12-04: qty 2

## 2014-12-04 MED ORDER — PREDNISONE 50 MG PO TABS: ORAL_TABLET | ORAL | Status: DC

## 2014-12-04 MED ORDER — ALBUTEROL SULFATE HFA 108 (90 BASE) MCG/ACT IN AERS: 1.0000 | INHALATION_SPRAY | Freq: Four times a day (QID) | RESPIRATORY_TRACT | Status: DC | PRN

## 2014-12-04 NOTE — ED Notes (Signed)
MD at bedside. 

## 2014-12-04 NOTE — Discharge Instructions (Signed)
Pleurisy Pleurisy is redness, puffiness (swelling), and soreness (inflammation) of the lining of the lungs. It can be hard to breathe and hurt to breathe. Coughing or deep breathing will make it hurt more. It is often caused by an existing infection or disease.  HOME CARE  Only take medicine as told by your doctor.  Only take antibiotic medicine as directed. Make sure to finish it even if you start to feel better. GET HELP RIGHT AWAY IF:   Your lips, fingernails, or toenails are blue or dark.  You cough up blood.  You have a hard time breathing.  Your pain is not controlled with medicine or it lasts for more than 1 week.  Your pain spreads (radiates) into your neck, arms, or jaw.  You are short of breath or wheezing.  You develop a fever, rash, throw up (vomit), or faint. MAKE SURE YOU:   Understand these instructions.  Will watch your condition.  Will get help right away if you are not doing well or get worse. Document Released: 03/17/2008 Document Revised: 12/05/2012 Document Reviewed: 09/16/2012 [facility] Patient Information 2015 [place], [place]. This information is not intended to replace advice given to you by your health care provider. Make sure you discuss any questions you have with your health care provider.   Stop smoking. Prescriptions for prednisone, inhaler, antibiotic, tramadol. Follow-up your primary care doctor. If symptoms do not improve, you will need a CT scan of your chest

## 2014-12-04 NOTE — ED Notes (Signed)
Patient c/o pain, EDP notified.

## 2014-12-04 NOTE — ED Notes (Signed)
Pt states that a spot was found on his lung a while back and he has not followed up on it.  States he has been coughing up blood for the past 2 days with pain with breathing.

## 2014-12-04 NOTE — ED Provider Notes (Signed)
CSN: [id number]     Arrival date & time 12/04/14  7829 History  This chart was scribed for Donnetta Hutching, MD by Elon Spanner, ED Scribe. This patient was seen in room APA03/APA03 and the patient's care was started at 10:12 AM.  Chief Complaint  Patient presents with  . Pleurisy  . Hemoptysis   The history is provided by the patient. No language interpreter was used.    HPI Comments: Eric Lowery is a 29 y.o. male HTN (indural) who presents to the Emergency Department complaining of two episodes of scant, specked hemoptysis onset yesterday morning.  Associated symptoms include wheezing and sharp CP with deep inspiration.  Two weeks ago, the patient inhaled a significant amount of drywall dust with some wheezing and feeling that he cannot take a deep.  Patient reports an abdominal CT scan two years ago where a 4 mm right lower lung nodule suspected to be inflammatory was found.  Patient is a current 1 pack/day smoker.  Family hx of multiple MIs in uncle in 63s, lung cancer in maternal and paternal grandmother's.  He denies congestion.  Past Medical History  Diagnosis Date  . Hypertension   . Chronic back pain   . Asthma    Past Surgical History  Procedure Laterality Date  . Hernia repair    . Thumb surgery     Family History  Problem Relation Age of Onset  . Hypertension Mother   . Seizures Mother   . Cancer Brother    Social History  Substance Use Topics  . Smoking status: Current Every Day Smoker -- 1.00 packs/day    Types: Cigarettes  . Smokeless tobacco: Current User    Types: Snuff  . Alcohol Use: No    Review of Systems A complete 10 system review of systems was obtained and all systems are negative except as noted in the HPI and PMH.   Allergies  Peanuts; Penicillins; Shellfish allergy; and Flexeril  Home Medications   Prior to Admission medications   Medication Sig Start Date End Date Taking? Authorizing Provider  ibuprofen (ADVIL,MOTRIN) 200 MG tablet Take 800 mg  by mouth every 6 (six) hours as needed for pain.   Yes Historical Provider, MD  montelukast (SINGULAIR) 10 MG tablet Take 10 mg by mouth at bedtime. For asthma   Yes Historical Provider, MD  propranolol (INDERAL) 80 MG tablet Take 80 mg by mouth daily.    Yes Historical Provider, MD  albuterol (PROVENTIL HFA;VENTOLIN HFA) 108 (90 BASE) MCG/ACT inhaler Inhale 1-2 puffs into the lungs every 6 (six) hours as needed for wheezing or shortness of breath. 12/04/14   Donnetta Hutching, MD  azithromycin (ZITHROMAX Z-PAK) 250 MG tablet Take 2 tabs by mouth on day 1 then 1 tab by mouth daily on days 2-5 12/04/14   Donnetta Hutching, MD  HYDROcodone-acetaminophen (NORCO/VICODIN) 5-325 MG per tablet Take 1-2 tablets by mouth every 4 (four) hours as needed. Patient not taking: Reported on 12/04/2014 08/11/14   [name], MD  levofloxacin (LEVAQUIN) 250 MG tablet Take 1 tablet (250 mg total) by mouth daily. Patient not taking: Reported on 12/04/2014 08/11/14   [name], MD  predniSONE (DELTASONE) 50 MG tablet 1 tablet for 6 days, one half tablet for 6 days 12/04/14   Donnetta Hutching, MD  traMADol (ULTRAM) 50 MG tablet Take 1 tablet (50 mg total) by mouth every 6 (six) hours as needed. 12/04/14   Donnetta Hutching, MD   BP 114/76 mmHg  Pulse 72  Temp(Src) 97.9 F (36.6 C) (Oral)  Resp 18  Ht  (1.88 m)  Wt 211 lb (95.709 kg)  BMI 27.08 kg/m2  SpO2 99% Physical Exam  Constitutional: He is oriented to person, place, and time. He appears well-developed and well-nourished.  HENT:  Head: Normocephalic and atraumatic.  Eyes: Conjunctivae and EOM are normal. Pupils are equal, round, and reactive to light.  Neck: Normal range of motion. Neck supple.  Cardiovascular: Normal rate and regular rhythm.   Pulmonary/Chest: Effort normal and breath sounds normal. He has no wheezes.  Abdominal: Soft. Bowel sounds are normal.  Musculoskeletal: Normal range of motion.  Neurological: He is alert and oriented to person, place, and time.   Skin: Skin is warm and dry.  Psychiatric: He has a normal mood and affect. His behavior is normal.  Nursing note and vitals reviewed.   ED Course  Procedures (including critical care time)  DIAGNOSTIC STUDIES: Oxygen Saturation is 97% on RA, normal by my interpretation.    COORDINATION OF CARE:  10:23 AM Discussed normal CXR with patient.  Will order nebulizer treatment and prescribe inhaler, prednisone, and antibiotic.  Patient counselled on importance of smoking cessation.  He should f/u with PCP if he observes no improvement.  Patient acknowledges and agrees with plan.    Labs Review Labs Reviewed - No data to display  Imaging Review Dg Chest 2 View  12/04/2014   CLINICAL DATA:  Cough, hemoptysis  EXAM: CHEST  2 VIEW  COMPARISON:  12/03/2010  FINDINGS: Cardiomediastinal silhouette is stable. Mild hyperinflation. No acute infiltrate or pleural effusion. No pulmonary edema. Mild thoracic dextroscoliosis.  IMPRESSION: No active disease. Mild hyperinflation. Mild thoracic dextroscoliosis.   Electronically Signed   By: Natasha Mead M.D.   On: 12/04/2014 10:15   I have personally reviewed and evaluated these images and lab results as part of my medical decision-making.   EKG Interpretation None      MDM   Final diagnoses:  Pleurisy    CT scan abdomen and pelvis reviewed from September 2014 which showed a 4 mm unknown mass in right lung. This was not seen on today's x-rays. These findings were discussed in great detail with the patient and his wife. They understand if symptoms persist, that he will need a CT scan of his chest. No hemoptysis in the emergency department. Discharge medications Zithromax, prednisone, albuterol inhaler, tramadol.  I personally performed the services described in this documentation, which was scribed in my presence. The recorded information has been reviewed and is accurate.     Donnetta Hutching, MD 12/04/14 [phone]

## 2014-12-04 NOTE — ED Notes (Signed)
RT notified

## 2014-12-30 ENCOUNTER — Emergency Department (HOSPITAL_COMMUNITY)
Admission: EM | Admit: 2014-12-30 | Discharge: 2014-12-30 | Disposition: A | Discharge status: Home / Self Care | Payer: 59 | Attending: Emergency Medicine | Admitting: Emergency Medicine

## 2014-12-30 ENCOUNTER — Emergency Department (HOSPITAL_COMMUNITY): Payer: 59

## 2014-12-30 ENCOUNTER — Encounter (HOSPITAL_COMMUNITY): Payer: Self-pay

## 2014-12-30 DIAGNOSIS — I10 Essential (primary) hypertension: Secondary | ICD-10-CM | POA: Insufficient documentation

## 2014-12-30 DIAGNOSIS — R1084 Generalized abdominal pain: Secondary | ICD-10-CM | POA: Insufficient documentation

## 2014-12-30 DIAGNOSIS — Z79899 Other long term (current) drug therapy: Secondary | ICD-10-CM | POA: Insufficient documentation

## 2014-12-30 DIAGNOSIS — R42 Dizziness and giddiness: Secondary | ICD-10-CM | POA: Insufficient documentation

## 2014-12-30 DIAGNOSIS — J45909 Unspecified asthma, uncomplicated: Secondary | ICD-10-CM | POA: Insufficient documentation

## 2014-12-30 DIAGNOSIS — G8929 Other chronic pain: Secondary | ICD-10-CM | POA: Insufficient documentation

## 2014-12-30 DIAGNOSIS — Z720 Tobacco use: Secondary | ICD-10-CM | POA: Insufficient documentation

## 2014-12-30 DIAGNOSIS — R109 Unspecified abdominal pain: Secondary | ICD-10-CM

## 2014-12-30 LAB — CBC WITH DIFFERENTIAL/PLATELET
Basophils Absolute: 0 10*3/uL (ref 0.0–0.1)
Basophils Relative: 0 % (ref 0–1)
Eosinophils Absolute: 0.2 10*3/uL (ref 0.0–0.7)
Eosinophils Relative: 3 % (ref 0–5)
HCT: 44.3 % (ref 39.0–52.0)
Hemoglobin: 14.8 g/dL (ref 13.0–17.0)
Lymphocytes Relative: 26 % (ref 12–46)
Lymphs Abs: 1.7 10*3/uL (ref 0.7–4.0)
MCH: 29.2 pg (ref 26.0–34.0)
MCHC: 33.4 g/dL (ref 30.0–36.0)
MCV: 87.5 fL (ref 78.0–100.0)
Monocytes Absolute: 0.5 10*3/uL (ref 0.1–1.0)
Monocytes Relative: 7 % (ref 3–12)
Neutro Abs: 4.1 10*3/uL (ref 1.7–7.7)
Neutrophils Relative %: 64 % (ref 43–77)
Platelets: 254 10*3/uL (ref 150–400)
RBC: 5.06 MIL/uL (ref 4.22–5.81)
RDW: 13 % (ref 11.5–15.5)
WBC: 6.5 10*3/uL (ref 4.0–10.5)

## 2014-12-30 LAB — BASIC METABOLIC PANEL
Anion gap: 6 (ref 5–15)
BUN: 17 mg/dL (ref 6–20)
CO2: 28 mmol/L (ref 22–32)
Calcium: 9.4 mg/dL (ref 8.9–10.3)
Chloride: 105 mmol/L (ref 101–111)
Creatinine, Ser: 1.02 mg/dL (ref 0.61–1.24)
GFR calc Af Amer: >60 mL/min (ref >60)
GFR calc non Af Amer: >60 mL/min (ref >60)
Glucose, Bld: 107 mg/dL — ABNORMAL HIGH (ref 65–99)
Potassium: 3.9 mmol/L (ref 3.5–5.1)
Sodium: 139 mmol/L (ref 135–145)

## 2014-12-30 LAB — URINALYSIS, ROUTINE W REFLEX MICROSCOPIC
Bilirubin Urine: NEGATIVE
Glucose, UA: NEGATIVE mg/dL
Hgb urine dipstick: NEGATIVE
Ketones, ur: NEGATIVE mg/dL
Leukocytes, UA: NEGATIVE
Nitrite: NEGATIVE
Protein, ur: NEGATIVE mg/dL
Specific Gravity, Urine: 1.025 (ref 1.005–1.030)
Urobilinogen, UA: 0.2 mg/dL (ref 0.0–1.0)
pH: 6 (ref 5.0–8.0)

## 2014-12-30 LAB — HEPATIC FUNCTION PANEL
ALT: 12 U/L — ABNORMAL LOW (ref 17–63)
AST: 21 U/L (ref 15–41)
Albumin: 4.5 g/dL (ref 3.5–5.0)
Alkaline Phosphatase: 83 U/L (ref 38–126)
Bilirubin, Direct: 0.1 mg/dL (ref 0.1–0.5)
Indirect Bilirubin: 0.8 mg/dL (ref 0.3–0.9)
Total Bilirubin: 0.9 mg/dL (ref 0.3–1.2)
Total Protein: 7.2 g/dL (ref 6.5–8.1)

## 2014-12-30 LAB — LIPASE, BLOOD: Lipase: 24 U/L (ref 22–51)

## 2014-12-30 MED ORDER — ACETAMINOPHEN 500 MG PO TABS: 500.0000 mg | ORAL_TABLET | Freq: Four times a day (QID) | ORAL | Status: DC | PRN

## 2014-12-30 MED ORDER — SODIUM CHLORIDE 0.9 % IV SOLN
1000.0000 mL | Freq: Once | INTRAVENOUS | Status: AC
  Administered 2014-12-30: 1000 mL via INTRAVENOUS

## 2014-12-30 MED ORDER — HYDROMORPHONE HCL 1 MG/ML IJ SOLN
1.0000 mg | Freq: Once | INTRAMUSCULAR | Status: AC
  Administered 2014-12-30: 1 mg via INTRAVENOUS
  Filled 2014-12-30: qty 1

## 2014-12-30 MED ORDER — MECLIZINE HCL 25 MG PO TABS: 25.0000 mg | ORAL_TABLET | Freq: Three times a day (TID) | ORAL | Status: DC | PRN

## 2014-12-30 MED ORDER — DICYCLOMINE HCL 20 MG PO TABS: 20.0000 mg | ORAL_TABLET | Freq: Two times a day (BID) | ORAL | Status: DC

## 2014-12-30 MED ORDER — ONDANSETRON HCL 4 MG/2ML IJ SOLN
4.0000 mg | Freq: Four times a day (QID) | INTRAMUSCULAR | Status: DC | PRN
  Administered 2014-12-30: 4 mg via INTRAVENOUS
  Filled 2014-12-30: qty 2

## 2014-12-30 MED ORDER — IOHEXOL 300 MG/ML  SOLN
100.0000 mL | Freq: Once | INTRAMUSCULAR | Status: AC | PRN
  Administered 2014-12-30: 100 mL via INTRAVENOUS

## 2014-12-30 MED ORDER — IOHEXOL 300 MG/ML  SOLN: 25.0000 mL | Freq: Once | INTRAMUSCULAR | Status: DC | PRN

## 2014-12-30 MED ORDER — LORAZEPAM 2 MG/ML IJ SOLN
1.0000 mg | Freq: Once | INTRAMUSCULAR | Status: AC
  Administered 2014-12-30: 1 mg via INTRAVENOUS
  Filled 2014-12-30: qty 1

## 2014-12-30 MED ORDER — SODIUM CHLORIDE 0.9 % IV SOLN
1000.0000 mL | INTRAVENOUS | Status: DC
Start: 2014-12-30 — End: 2014-12-30

## 2014-12-30 MED ORDER — IOHEXOL 300 MG/ML  SOLN
25.0000 mL | Freq: Once | INTRAMUSCULAR | Status: AC | PRN
  Administered 2014-12-30: 25 mL via ORAL

## 2014-12-30 MED ORDER — IBUPROFEN 600 MG PO TABS: 600.0000 mg | ORAL_TABLET | Freq: Three times a day (TID) | ORAL | Status: DC | PRN

## 2014-12-30 NOTE — ED Notes (Signed)
Pt c/o dizziness and r sided abd pain x 3 days.  Denies n/v/d.  LBM was today and was normal per pt.

## 2014-12-30 NOTE — ED Notes (Signed)
Patient ambulated to room with steady gait. No distress.

## 2014-12-30 NOTE — ED Provider Notes (Signed)
CSN: [id number]     Arrival date & time 12/30/14  1191 History   This chart was scribed for Azalia Bilis, MD by Murriel Hopper, ED Scribe. This patient was seen in room APA10/APA10 and the patient's care was started at 10:25 AM.  Chief Complaint  Patient presents with  . Abdominal Pain      The history is provided by the patient. No language interpreter was used.   HPI Comments: Eric Lowery is a 29 y.o. male who presents to the Emergency Department complaining of constant, worsening right-sided abdominal pain with associated dizziness, diaphoresis, and fever that has been present for 3 days. Pt states he was awoken from his sleep ;ast night around 0300 because of his abdominal pain. Pt states he has never had abdominal problems before. Pt states his last bowel movement was today and was normal. Pt denies nausea, vomiting, diarrhea, back pain. Pt denies hx of gallstones.    Past Medical History  Diagnosis Date  . Hypertension   . Chronic back pain   . Asthma    Past Surgical History  Procedure Laterality Date  . Hernia repair    . Thumb surgery     Family History  Problem Relation Age of Onset  . Hypertension Mother   . Seizures Mother   . Cancer Brother    Social History  Substance Use Topics  . Smoking status: Current Every Day Smoker -- 1.00 packs/day    Types: Cigarettes  . Smokeless tobacco: Current User    Types: Snuff  . Alcohol Use: Yes     Comment: occ    Review of Systems  A complete 10 system review of systems was obtained and all systems are negative except as noted in the HPI and PMH.    Allergies  Peanuts; Penicillins; Shellfish allergy; and Flexeril  Home Medications   Prior to Admission medications   Medication Sig Start Date End Date Taking? Authorizing Provider  albuterol (PROVENTIL HFA;VENTOLIN HFA) 108 (90 BASE) MCG/ACT inhaler Inhale 1-2 puffs into the lungs every 6 (six) hours as needed for wheezing or shortness of breath. 12/04/14  Yes  Donnetta Hutching, MD  Ascorbic Acid (VITAMIN C) 1000 MG tablet Take 1,000 mg by mouth daily.   Yes Historical Provider, MD  ibuprofen (ADVIL,MOTRIN) 200 MG tablet Take 800 mg by mouth every 6 (six) hours as needed for pain.   Yes Historical Provider, MD  azithromycin (ZITHROMAX Z-PAK) 250 MG tablet Take 2 tabs by mouth on day 1 then 1 tab by mouth daily on days 2-5 Patient not taking: Reported on 12/30/2014 12/04/14   Donnetta Hutching, MD  HYDROcodone-acetaminophen (NORCO/VICODIN) 5-325 MG per tablet Take 1-2 tablets by mouth every 4 (four) hours as needed. Patient not taking: Reported on 12/04/2014 08/11/14   [name], MD  levofloxacin (LEVAQUIN) 250 MG tablet Take 1 tablet (250 mg total) by mouth daily. Patient not taking: Reported on 12/04/2014 08/11/14   [name], MD  predniSONE (DELTASONE) 50 MG tablet 1 tablet for 6 days, one half tablet for 6 days Patient not taking: Reported on 12/30/2014 12/04/14   Donnetta Hutching, MD  propranolol (INDERAL) 80 MG tablet Take 80 mg by mouth daily.     Historical Provider, MD  traMADol (ULTRAM) 50 MG tablet Take 1 tablet (50 mg total) by mouth every 6 (six) hours as needed. Patient not taking: Reported on 12/30/2014 12/04/14   Donnetta Hutching, MD   BP 149/88 mmHg  Pulse 80  Temp(Src) 98.3 F (  36.8 C) (Oral)  Resp 18  Ht 6\' 2"  (1.88 m)  Wt 215 lb (97.523 kg)  BMI 27.59 kg/m2  SpO2 100% Physical Exam  Constitutional: He is oriented to person, place, and time. He appears well-developed and well-nourished.  HENT:  Head: Normocephalic and atraumatic.  Eyes: EOM are normal.  Neck: Normal range of motion.  Cardiovascular: Normal rate, regular rhythm, normal heart sounds and intact distal pulses.   Pulmonary/Chest: Effort normal and breath sounds normal. No respiratory distress.  Abdominal: Soft. He exhibits no distension. There is tenderness.  Diffuse Tenderness throughout right abdomen   Musculoskeletal: Normal range of motion.  Neurological: He is alert and  oriented to person, place, and time.  Skin: Skin is warm and dry.  Psychiatric: He has a normal mood and affect. Judgment normal.  Nursing note and vitals reviewed.   ED Course  Procedures (including critical care time)  DIAGNOSTIC STUDIES: Oxygen Saturation is 100% on room air, normal by my interpretation.    COORDINATION OF CARE: 10:30 AM Discussed treatment plan with pt at bedside and pt agreed to plan.   Labs Review Labs Reviewed  BASIC METABOLIC PANEL - Abnormal; Notable for the following:    Glucose, Bld 107 (*)    All other components within normal limits  HEPATIC FUNCTION PANEL - Abnormal; Notable for the following:    ALT 12 (*)    All other components within normal limits  CBC WITH DIFFERENTIAL/PLATELET  URINALYSIS, ROUTINE W REFLEX MICROSCOPIC (NOT AT [facility])  LIPASE, BLOOD    Imaging Review Ct Abdomen Pelvis W Contrast  12/30/2014   CLINICAL DATA:  Right lower quadrant pain, dizziness for 3 days  EXAM: CT ABDOMEN AND PELVIS WITH CONTRAST  TECHNIQUE: Multidetector CT imaging of the abdomen and pelvis was performed using the standard protocol following bolus administration of intravenous contrast.  CONTRAST:  25mL OMNIPAQUE IOHEXOL 300 MG/ML SOLN, OMNIPAQUE IOHEXOL 300 MG/ML SOLN  COMPARISON:  12/23/2012  FINDINGS: Sagittal images of the spine are unremarkable. Lung bases are unremarkable. No calcified gallstones are noted within gallbladder. Enhanced liver, spleen, pancreas and adrenal glands are unremarkable. Abdominal aorta is unremarkable.  No small bowel obstruction. No ascites or free air. No adenopathy. No thickened or dilated small bowel loops. There is no pericecal inflammation. Normal appendix is noted in axial image 58. Terminal ileum is unremarkable. Some stool noted in rectosigmoid colon. Prostate gland and seminal vesicles are unremarkable. No distal colonic obstruction. The urinary bladder is unremarkable. No inguinal adenopathy. No destructive bony lesions  are noted within pelvis.  There is no hydronephrosis or hydroureter.  No focal renal mass.  IMPRESSION: 1. No acute inflammatory process within abdomen or pelvis. 2. Normal appendix.  No pericecal inflammation. 3. No small bowel obstruction.  No hydronephrosis or hydroureter.   Electronically Signed   By: Natasha Mead M.D.   On: 12/30/2014 11:37   I have personally reviewed and evaluated these images and lab results as part of my medical decision-making.    MDM   Final diagnoses:  Abdominal pain, unspecified abdominal location  Dizziness    Patient is overall well-appearing.  CT scan with some right-sided sided abdominal tenderness however CT scan [place] is no acute pathology.  Labs and urine are without abnormality.  Patient is ambulatory in the emergency department.  He is feeling better.  He also did complain of some vertiginous-like symptoms.  He seems to be better after Ativan.  Patient will be given meclizine for home.  I've asked the patient return to the emergency department or follow-up with his primary care physician in 36 hours for recheck.  He understands return to ER for new or worsening symptoms.  I personally performed the services described in this documentation, which was scribed in my presence. The recorded information has been reviewed and is accurate.      Azalia Bilis, MD 12/30/14 [phone]

## 2014-12-30 NOTE — Discharge Instructions (Signed)

## 2014-12-30 NOTE — ED Notes (Signed)
Ambulated Pt around ed pt states he feels increased dizziness when he is up moving around.

## 2015-01-17 ENCOUNTER — Encounter (HOSPITAL_COMMUNITY): Payer: Self-pay | Admitting: Emergency Medicine

## 2015-01-17 ENCOUNTER — Emergency Department (HOSPITAL_COMMUNITY): Payer: Self-pay

## 2015-01-17 ENCOUNTER — Emergency Department (HOSPITAL_COMMUNITY)
Admission: EM | Admit: 2015-01-17 | Discharge: 2015-01-17 | Disposition: A | Discharge status: Home / Self Care | Payer: Self-pay | Attending: Emergency Medicine | Admitting: Emergency Medicine

## 2015-01-17 DIAGNOSIS — Z880 Allergy status to penicillin: Secondary | ICD-10-CM | POA: Insufficient documentation

## 2015-01-17 DIAGNOSIS — Z720 Tobacco use: Secondary | ICD-10-CM | POA: Insufficient documentation

## 2015-01-17 DIAGNOSIS — G8929 Other chronic pain: Secondary | ICD-10-CM | POA: Insufficient documentation

## 2015-01-17 DIAGNOSIS — R918 Other nonspecific abnormal finding of lung field: Secondary | ICD-10-CM | POA: Insufficient documentation

## 2015-01-17 DIAGNOSIS — Z9889 Other specified postprocedural states: Secondary | ICD-10-CM | POA: Insufficient documentation

## 2015-01-17 DIAGNOSIS — I10 Essential (primary) hypertension: Secondary | ICD-10-CM | POA: Insufficient documentation

## 2015-01-17 DIAGNOSIS — J45909 Unspecified asthma, uncomplicated: Secondary | ICD-10-CM | POA: Insufficient documentation

## 2015-01-17 DIAGNOSIS — R1030 Lower abdominal pain, unspecified: Secondary | ICD-10-CM | POA: Insufficient documentation

## 2015-01-17 DIAGNOSIS — Z79899 Other long term (current) drug therapy: Secondary | ICD-10-CM | POA: Insufficient documentation

## 2015-01-17 DIAGNOSIS — K921 Melena: Secondary | ICD-10-CM | POA: Insufficient documentation

## 2015-01-17 LAB — COMPREHENSIVE METABOLIC PANEL
ALT: 12 U/L — ABNORMAL LOW (ref 17–63)
AST: 25 U/L (ref 15–41)
Albumin: 4.3 g/dL (ref 3.5–5.0)
Alkaline Phosphatase: 83 U/L (ref 38–126)
Anion gap: 6 (ref 5–15)
BUN: 15 mg/dL (ref 6–20)
CO2: 26 mmol/L (ref 22–32)
Calcium: 9.2 mg/dL (ref 8.9–10.3)
Chloride: 108 mmol/L (ref 101–111)
Creatinine, Ser: 0.98 mg/dL (ref 0.61–1.24)
GFR calc Af Amer: >60 mL/min (ref >60)
GFR calc non Af Amer: >60 mL/min (ref >60)
Glucose, Bld: 135 mg/dL — ABNORMAL HIGH (ref 65–99)
Potassium: 3.7 mmol/L (ref 3.5–5.1)
Sodium: 140 mmol/L (ref 135–145)
Total Bilirubin: 0.6 mg/dL (ref 0.3–1.2)
Total Protein: 6.9 g/dL (ref 6.5–8.1)

## 2015-01-17 LAB — CBC
HCT: 42.5 % (ref 39.0–52.0)
Hemoglobin: 14.8 g/dL (ref 13.0–17.0)
MCH: 30.1 pg (ref 26.0–34.0)
MCHC: 34.8 g/dL (ref 30.0–36.0)
MCV: 86.6 fL (ref 78.0–100.0)
Platelets: 269 10*3/uL (ref 150–400)
RBC: 4.91 MIL/uL (ref 4.22–5.81)
RDW: 12.8 % (ref 11.5–15.5)
WBC: 9.1 10*3/uL (ref 4.0–10.5)

## 2015-01-17 MED ORDER — MORPHINE SULFATE (PF) 4 MG/ML IV SOLN
4.0000 mg | Freq: Once | INTRAVENOUS | Status: AC
  Administered 2015-01-17: 4 mg via INTRAVENOUS
  Filled 2015-01-17: qty 1

## 2015-01-17 MED ORDER — ONDANSETRON HCL 4 MG/2ML IJ SOLN
4.0000 mg | Freq: Once | INTRAMUSCULAR | Status: AC
  Administered 2015-01-17: 4 mg via INTRAVENOUS
  Filled 2015-01-17: qty 2

## 2015-01-17 MED ORDER — ONDANSETRON HCL 8 MG PO TABS: 8.0000 mg | ORAL_TABLET | ORAL | Status: DC | PRN

## 2015-01-17 MED ORDER — HYDROMORPHONE HCL 1 MG/ML IJ SOLN
1.0000 mg | Freq: Once | INTRAMUSCULAR | Status: AC
  Administered 2015-01-17: 1 mg via INTRAVENOUS

## 2015-01-17 MED ORDER — IOHEXOL 300 MG/ML  SOLN
25.0000 mL | Freq: Once | INTRAMUSCULAR | Status: AC | PRN
  Administered 2015-01-17: 25 mL via ORAL

## 2015-01-17 MED ORDER — IOHEXOL 300 MG/ML  SOLN
100.0000 mL | Freq: Once | INTRAMUSCULAR | Status: AC | PRN
  Administered 2015-01-17: 100 mL via INTRAVENOUS

## 2015-01-17 MED ORDER — HYDROMORPHONE HCL 1 MG/ML IJ SOLN
INTRAMUSCULAR | Status: AC
  Filled 2015-01-17: qty 1

## 2015-01-17 MED ORDER — HYDROMORPHONE HCL 1 MG/ML IJ SOLN
1.0000 mg | Freq: Once | INTRAMUSCULAR | Status: AC
  Administered 2015-01-17: 1 mg via INTRAVENOUS
  Filled 2015-01-17: qty 1

## 2015-01-17 MED ORDER — OXYCODONE-ACETAMINOPHEN 5-325 MG PO TABS: 1.0000 | ORAL_TABLET | ORAL | Status: DC | PRN

## 2015-01-17 MED ORDER — SODIUM CHLORIDE 0.9 % IV BOLUS (SEPSIS)
1000.0000 mL | Freq: Once | INTRAVENOUS | Status: AC
  Administered 2015-01-17: 1000 mL via INTRAVENOUS

## 2015-01-17 NOTE — ED Notes (Signed)
Pt here a couple weeks ago for abd pain.  Pt stated had bm today and had toilet full of bright red blood. Patient stated he became dizzy and still at this time.  Pt c/o sharp pains to lower abd. lnbm today prior to blood in stool.  Denies n/v. MM wet.

## 2015-01-17 NOTE — Discharge Instructions (Signed)
CT scan showed no acute abdominal findings. You have a 4 mm mass in your right middle lobe of the lung. You will need a repeat CT scan of your lung in approximately 1 year. Medication for pain and nausea. Clear liquids for the next 12 hours. Follow-up your primary care doctor. If symptoms persist, you will need an colonoscopy to further assess your intestine

## 2015-01-17 NOTE — ED Provider Notes (Signed)
CSN: [id number]     Arrival date & time 01/17/15  1823 History   First MD Initiated Contact with Patient 01/17/15 1829     Chief Complaint  Patient presents with  . Abdominal Pain  . Blood In Stools     (Consider location/radiation/quality/duration/timing/severity/associated sxs/prior Treatment) HPI....Marland Kitchenlower abdominal cramping approximately 1 hour prior to arrival with associated bloody diarrhea. Wife quantifies amount as approximately 1/2 cup. No mucus in stool. This has never happened before. No previous history of colitis. Past surgical history includes right inguinal hernia repair.  No fever, chills, vomiting,chest pain, dyspnea.  Past Medical History  Diagnosis Date  . Hypertension   . Chronic back pain   . Asthma    Past Surgical History  Procedure Laterality Date  . Hernia repair    . Thumb surgery     Family History  Problem Relation Age of Onset  . Hypertension Mother   . Seizures Mother   . Cancer Brother    Social History  Substance Use Topics  . Smoking status: Current Every Day Smoker -- 1.00 packs/day    Types: Cigarettes  . Smokeless tobacco: Current User    Types: Snuff  . Alcohol Use: Yes     Comment: occ    Review of Systems  All other systems reviewed and are negative.     Allergies  Peanuts; Penicillins; Shellfish allergy; and Flexeril  Home Medications   Prior to Admission medications   Medication Sig Start Date End Date Taking? Authorizing Provider  albuterol (PROVENTIL HFA;VENTOLIN HFA) 108 (90 BASE) MCG/ACT inhaler Inhale 1-2 puffs into the lungs every 6 (six) hours as needed for wheezing or shortness of breath. 12/04/14  Yes Donnetta Hutching, MD  propranolol (INDERAL) 80 MG tablet Take 80 mg by mouth daily.    Yes Historical Provider, MD  acetaminophen (TYLENOL) 500 MG tablet Take 1 tablet (500 mg total) by mouth every 6 (six) hours as needed. 12/30/14   Azalia Bilis, MD  dicyclomine (BENTYL) 20 MG tablet Take 1 tablet (20 mg total) by mouth 2  (two) times daily. 12/30/14   Azalia Bilis, MD  ibuprofen (ADVIL,MOTRIN) 600 MG tablet Take 1 tablet (600 mg total) by mouth every 8 (eight) hours as needed. 12/30/14   Azalia Bilis, MD  meclizine (ANTIVERT) 25 MG tablet Take 1 tablet (25 mg total) by mouth 3 (three) times daily as needed for dizziness. 12/30/14   Azalia Bilis, MD  ondansetron (ZOFRAN) 8 MG tablet Take 1 tablet (8 mg total) by mouth every 4 (four) hours as needed. 01/17/15   Donnetta Hutching, MD  oxyCODONE-acetaminophen (PERCOCET) 5-325 MG tablet Take 1-2 tablets by mouth every 4 (four) hours as needed. 01/17/15   Donnetta Hutching, MD   BP 129/75 mmHg  Pulse 67  Temp(Src) 97.9 F (36.6 C) (Oral)  Resp 20  Ht  (1.88 m)  Wt 200 lb (90.719 kg)  BMI 25.67 kg/m2  SpO2 99% Physical Exam  Constitutional: He is oriented to person, place, and time. He appears well-developed and well-nourished.  HENT:  Head: Normocephalic and atraumatic.  Eyes: Conjunctivae and EOM are normal. Pupils are equal, round, and reactive to light.  Neck: Normal range of motion. Neck supple.  Cardiovascular: Normal rate and regular rhythm.   Pulmonary/Chest: Effort normal and breath sounds normal.  Abdominal: Soft. Bowel sounds are normal.  Minimal lower abdominal tenderness.  Musculoskeletal: Normal range of motion.  Neurological: He is alert and oriented to person, place, and time.  Skin: Skin is  warm and dry.  Psychiatric: He has a normal mood and affect. His behavior is normal.  Nursing note and vitals reviewed.   ED Course  Procedures (including critical care time) Labs Review Labs Reviewed  COMPREHENSIVE METABOLIC PANEL - Abnormal; Notable for the following:    Glucose, Bld 135 (*)    ALT 12 (*)    All other components within normal limits  CBC  POC OCCULT BLOOD, ED    Imaging Review Ct Abdomen Pelvis W Contrast  01/17/2015   CLINICAL DATA:  Left lower abdominal pain and bloody diarrhea.  EXAM: CT ABDOMEN AND PELVIS WITH CONTRAST  TECHNIQUE:  Multidetector CT imaging of the abdomen and pelvis was performed using the standard protocol following bolus administration of intravenous contrast.  CONTRAST:  25mL OMNIPAQUE IOHEXOL 300 MG/ML SOLN, OMNIPAQUE IOHEXOL 300 MG/ML SOLN  COMPARISON:  12/30/2014 CT abdomen/pelvis .  FINDINGS: Lower chest: Right middle lobe 4 mm solid pulmonary nodule (series 6/image 4), stable since 12/30/2014, cannot compare to the 12/23/2012 CT as this portion of the lung was not included on the study. Right lower lobe 4 mm solid pulmonary nodule (6/5), stable since 12/23/2012 and benign. Left lower lobe subpleural solid 4 mm pulmonary nodule (6/10), stable since 12/23/2012 and benign. Left lower lobe 4 mm solid pulmonary nodule (6/1), stable since 12/23/2012 and benign.  Hepatobiliary: Normal liver with no liver mass. Normal gallbladder with no radiopaque cholelithiasis. No biliary ductal dilatation.  Pancreas: Normal, with no mass or duct dilation.  Spleen: Normal size. No mass.  Adrenals/Urinary Tract: Normal adrenals. Normal kidneys with no hydronephrosis and no renal mass. Normal bladder.  Stomach/Bowel: Grossly normal stomach. Normal caliber small bowel with no small bowel wall thickening. Normal appendix. The colon is relatively collapsed. No appreciable large bowel wall thickening or pericolonic fat stranding. No colonic diverticulosis.  Vascular/Lymphatic: Normal caliber abdominal aorta. Patent portal, splenic and renal veins. No pathologically enlarged lymph nodes in the abdomen or pelvis.  Reproductive: Normal prostate.  Other: No pneumoperitoneum, ascites or focal fluid collection.  Musculoskeletal: No aggressive appearing focal osseous lesions.  IMPRESSION: 1. No evidence of bowel obstruction or acute bowel inflammation. Normal appendix. 2. Right middle lobe 4 mm pulmonary nodule. If the patient is at high risk for bronchogenic carcinoma, follow-up chest CT at 1 year is recommended. If the patient is at low risk, no  follow-up is needed. This recommendation follows the consensus statement: Guidelines for Management of Small Pulmonary Nodules Detected on CT Scans: A Statement from the Fleischner Society as published in Radiology 2005; 237:395-400.   Electronically Signed   By: Delbert Phenix M.D.   On: 01/17/2015 21:22   I have personally reviewed and evaluated these images and lab results as part of my medical decision-making.   EKG Interpretation None      MDM   Final diagnoses:  Abdominal pain, suprapubic, unspecified laterality  Lung mass    No acute abdomen. Patient feels better after IV fluids and pain management. CT scan shows no acute bowel pathology. There is a 4 mm nodule in the right middle lobe. This was discussed with the patient and his wife. Discharge medications Percocet and Zofran 8 mg. He is encouraged to follow-up with his primary care doctor and seek a colonoscopy if symptoms persist.    Donnetta Hutching, MD 01/17/15 2153

## 2015-02-10 ENCOUNTER — Emergency Department (HOSPITAL_COMMUNITY): Payer: Self-pay

## 2015-02-10 ENCOUNTER — Encounter (HOSPITAL_COMMUNITY): Payer: Self-pay

## 2015-02-10 ENCOUNTER — Emergency Department (HOSPITAL_COMMUNITY)
Admission: EM | Admit: 2015-02-10 | Discharge: 2015-02-10 | Disposition: A | Discharge status: Home / Self Care | Payer: Self-pay | Attending: Emergency Medicine | Admitting: Emergency Medicine

## 2015-02-10 DIAGNOSIS — I10 Essential (primary) hypertension: Secondary | ICD-10-CM | POA: Insufficient documentation

## 2015-02-10 DIAGNOSIS — Y92009 Unspecified place in unspecified non-institutional (private) residence as the place of occurrence of the external cause: Secondary | ICD-10-CM | POA: Insufficient documentation

## 2015-02-10 DIAGNOSIS — Z79899 Other long term (current) drug therapy: Secondary | ICD-10-CM | POA: Insufficient documentation

## 2015-02-10 DIAGNOSIS — Z720 Tobacco use: Secondary | ICD-10-CM | POA: Insufficient documentation

## 2015-02-10 DIAGNOSIS — G8929 Other chronic pain: Secondary | ICD-10-CM | POA: Insufficient documentation

## 2015-02-10 DIAGNOSIS — W132XXA Fall from, out of or through roof, initial encounter: Secondary | ICD-10-CM | POA: Insufficient documentation

## 2015-02-10 DIAGNOSIS — Y998 Other external cause status: Secondary | ICD-10-CM | POA: Insufficient documentation

## 2015-02-10 DIAGNOSIS — S8981XA Other specified injuries of right lower leg, initial encounter: Secondary | ICD-10-CM | POA: Insufficient documentation

## 2015-02-10 DIAGNOSIS — S79911A Unspecified injury of right hip, initial encounter: Secondary | ICD-10-CM | POA: Insufficient documentation

## 2015-02-10 DIAGNOSIS — J45909 Unspecified asthma, uncomplicated: Secondary | ICD-10-CM | POA: Insufficient documentation

## 2015-02-10 DIAGNOSIS — Y93E5 Activity, floor mopping and cleaning: Secondary | ICD-10-CM | POA: Insufficient documentation

## 2015-02-10 DIAGNOSIS — W19XXXA Unspecified fall, initial encounter: Secondary | ICD-10-CM

## 2015-02-10 DIAGNOSIS — Z880 Allergy status to penicillin: Secondary | ICD-10-CM | POA: Insufficient documentation

## 2015-02-10 MED ORDER — IBUPROFEN 800 MG PO TABS: 800.0000 mg | ORAL_TABLET | Freq: Three times a day (TID) | ORAL | Status: DC

## 2015-02-10 MED ORDER — IBUPROFEN 800 MG PO TABS
800.0000 mg | ORAL_TABLET | Freq: Once | ORAL | Status: AC
  Administered 2015-02-10: 800 mg via ORAL
  Filled 2015-02-10: qty 1

## 2015-02-10 MED ORDER — OXYCODONE-ACETAMINOPHEN 5-325 MG PO TABS
1.0000 | ORAL_TABLET | Freq: Once | ORAL | Status: AC
  Administered 2015-02-10: 1 via ORAL
  Filled 2015-02-10: qty 1

## 2015-02-10 MED ORDER — HYDROMORPHONE HCL 1 MG/ML IJ SOLN
1.0000 mg | Freq: Once | INTRAMUSCULAR | Status: AC
  Administered 2015-02-10: 1 mg via INTRAMUSCULAR
  Filled 2015-02-10: qty 1

## 2015-02-10 MED ORDER — HYDROCODONE-ACETAMINOPHEN 5-325 MG PO TABS
1.0000 | ORAL_TABLET | Freq: Once | ORAL | Status: AC
  Administered 2015-02-10: 1 via ORAL
  Filled 2015-02-10: qty 1

## 2015-02-10 MED ORDER — OXYCODONE-ACETAMINOPHEN 5-325 MG PO TABS: 1.0000 | ORAL_TABLET | ORAL | Status: DC | PRN

## 2015-02-10 MED ORDER — ONDANSETRON 8 MG PO TBDP
8.0000 mg | ORAL_TABLET | Freq: Once | ORAL | Status: AC
  Administered 2015-02-10: 8 mg via ORAL
  Filled 2015-02-10: qty 1

## 2015-02-10 NOTE — ED Notes (Signed)
Pt reports was cleaning the gutters on his mothers building and fell off approx 15 feet.  Pt says landed on feet and c/o pain to r knee.

## 2015-02-10 NOTE — Discharge Instructions (Signed)
As discussed,  I suspect you may have a new meniscal injury, although we cannot rule out a possible ACL injury as well, given the nature of your injury and location of pain.  Where the knee  immobilizer all times, use crutches to avoid weightbearing.  Ice and elevate your knee is much as possible for the next several days.  Use the medications prescribed, do not drive with infarcts of taking oxycodone.  Call Dr. Ranell PatrickNorris for an office visit for recheck of your injury this week.

## 2015-02-10 NOTE — ED Provider Notes (Signed)
CSN: [id number]     Arrival date & time 02/10/15  1451 History   First MD Initiated Contact with Patient 02/10/15 1614     Chief Complaint  Patient presents with  . Fall     (Consider location/radiation/quality/duration/timing/severity/associated sxs/prior Treatment) The history is provided by the patient.   Eric Lowery is a 29 y.o. male presenting with predominantly right knee pain, but also hurts in his foot and hip of this right extremity since falling off of a roof just prior to arrival.  He was cleaning the gutters at his mother's home, slipped and fell approximately 15 feet landing directly on his right foot and describes a hyperextension injury of the right knee when he landed.  He landed on grass, denies head injury, back pain.  He has been unable to bear weight secondary to his knee pain.  He does report milder pain in his right foot, denies heel pain.  He denies numbness in his foot or leg.  He has had no treatments for his injury prior to arrival.  He endorses having a meniscal injury in the same knee 4 years ago at which time he was treated by Dr. Ranell PatrickNorris with nonsurgical intervention.     Past Medical History  Diagnosis Date  . Hypertension   . Chronic back pain   . Asthma    Past Surgical History  Procedure Laterality Date  . Hernia repair    . Thumb surgery     Family History  Problem Relation Age of Onset  . Hypertension Mother   . Seizures Mother   . Cancer Brother    Social History  Substance Use Topics  . Smoking status: Current Every Day Smoker -- 1.00 packs/day    Types: Cigarettes  . Smokeless tobacco: Current User    Types: Snuff  . Alcohol Use: Yes     Comment: occ    Review of Systems  Constitutional: Negative for fever.  Musculoskeletal: Positive for arthralgias. Negative for myalgias and joint swelling.  Neurological: Negative for weakness and numbness.      Allergies  Peanuts; Penicillins; Shellfish allergy; and Flexeril  Home  Medications   Prior to Admission medications   Medication Sig Start Date End Date Taking? Authorizing Provider  albuterol (PROVENTIL HFA;VENTOLIN HFA) 108 (90 BASE) MCG/ACT inhaler Inhale 1-2 puffs into the lungs every 6 (six) hours as needed for wheezing or shortness of breath. 12/04/14  Yes Donnetta HutchingBrian Cook, MD  meclizine (ANTIVERT) 25 MG tablet Take 1 tablet (25 mg total) by mouth 3 (three) times daily as needed for dizziness. 12/30/14  Yes Azalia BilisKevin Campos, MD  propranolol (INDERAL) 80 MG tablet Take 10 mg by mouth every 4 (four) hours.    Yes Historical Provider, MD  acetaminophen (TYLENOL) 500 MG tablet Take 1 tablet (500 mg total) by mouth every 6 (six) hours as needed. Patient not taking: Reported on 02/10/2015 12/30/14   Azalia BilisKevin Campos, MD  dicyclomine (BENTYL) 20 MG tablet Take 1 tablet (20 mg total) by mouth 2 (two) times daily. Patient not taking: Reported on 02/10/2015 12/30/14   Azalia BilisKevin Campos, MD  ibuprofen (ADVIL,MOTRIN) 800 MG tablet Take 1 tablet (800 mg total) by mouth 3 (three) times daily. 02/10/15   Burgess AmorJulie Shaheed Schmuck, PA-C  ondansetron (ZOFRAN) 8 MG tablet Take 1 tablet (8 mg total) by mouth every 4 (four) hours as needed. Patient not taking: Reported on 02/10/2015 01/17/15   Donnetta HutchingBrian Cook, MD  oxyCODONE-acetaminophen (PERCOCET/ROXICET) 5-325 MG tablet Take 1 tablet by mouth every  4 (four) hours as needed. 02/10/15   Burgess Amor, PA-C   BP 109/74 mmHg  Pulse 92  Temp(Src) 98.1 F (36.7 C) (Oral)  Resp 18  Ht  (1.88 m)  Wt 205 lb (92.987 kg)  BMI 26.31 kg/m2  SpO2 100% Physical Exam  Constitutional: He appears well-developed and well-nourished.  HENT:  Head: Atraumatic.  Neck: Normal range of motion.  Cardiovascular:  Pulses equal bilaterally  Musculoskeletal: He exhibits tenderness. He exhibits no edema.       Right hip: He exhibits tenderness. He exhibits no bony tenderness.       Right knee: He exhibits decreased range of motion and bony tenderness. He exhibits no swelling, no  effusion, no ecchymosis, no deformity, normal alignment, no LCL laxity and no MCL laxity. Tenderness found. Lateral joint line tenderness noted. No patellar tendon tenderness noted.       Right ankle: Normal.       Right foot: There is no tenderness and no deformity.  Mild tender to palpation right lateral hip.  No lumbar tenderness, step-offs or palpable deformity.  Positive posterior drawer test, exquisite pain, subtle movement more than comparable left knee.  No effusion appreciated.  Neurological: He is alert. He has normal strength. He displays normal reflexes. No sensory deficit.  Skin: Skin is warm and dry.  Psychiatric: He has a normal mood and affect.    ED Course  Procedures (including critical care time) Labs Review Labs Reviewed - No data to display  Imaging Review Dg Tibia/fibula Right  02/10/2015  CLINICAL DATA:  Patient status post fall from roof. Initial encounter. EXAM: RIGHT TIBIA AND FIBULA - 2 VIEW COMPARISON:  None. FINDINGS: Normal anatomic alignment. No evidence for acute fracture or dislocation. Regional soft tissues unremarkable. IMPRESSION: No acute osseous abnormality. Electronically Signed   By: Annia Belt M.D.   On: 02/10/2015 17:34   Dg Knee Complete 4 Views Right  02/10/2015  CLINICAL DATA:  Fall off ladder today, right foot pain EXAM: RIGHT KNEE - COMPLETE 4+ VIEW COMPARISON:  01/12/2011 FINDINGS: Four views of the right knee submitted. No acute fracture or subluxation. No radiopaque foreign body. No joint effusion. IMPRESSION: Negative. Electronically Signed   By: Natasha Mead M.D.   On: 02/10/2015 15:51   Dg Foot Complete Right  02/10/2015  CLINICAL DATA:  Fall from roof today, pain in right hip to right foot. EXAM: RIGHT FOOT COMPLETE - 3+ VIEW COMPARISON:  None. FINDINGS: There is no evidence of fracture or dislocation. There is no evidence of arthropathy or other focal bone abnormality. Soft tissues are unremarkable. IMPRESSION: Negative.  No fracture or  dislocation. Electronically Signed   By: Bary Richard M.D.   On: 02/10/2015 17:33   Dg Hip Unilat With Pelvis 2-3 Views Right  02/10/2015  CLINICAL DATA:  Fall from roof today. Complaining of right hip pain. EXAM: DG HIP (WITH OR WITHOUT PELVIS) 2-3V RIGHT COMPARISON:  None. FINDINGS: There is no evidence of hip fracture or dislocation. There is no evidence of arthropathy or other focal bone abnormality. IMPRESSION: Negative. Electronically Signed   By: Amie Portland M.D.   On: 02/10/2015 17:33   I have personally reviewed and evaluated these images and lab results as part of my medical decision-making.   EKG Interpretation None      MDM   Final diagnoses:  Fall  Knee hyperextension injury, right, initial encounter     Radiological studies were viewed, interpreted and considered during the medical  decision making and disposition process. I agree with radiologists reading.  Results were also discussed with patient.  Patient was placed in a knee immobilizer, crutches given.  Advised rest ice elevation.  Prescribed oxycodone and ibuprofen.  Patient is to call Dr. Ranell Patrick in the morning for follow-up this week for further evaluation and management of his injury.      Burgess Amor, PA-C 02/10/15 1826  Zadie Rhine, MD 02/10/15 [phone]

## 2015-02-12 ENCOUNTER — Telehealth: Payer: Self-pay | Admitting: Orthopedic Surgery

## 2015-02-12 NOTE — Telephone Encounter (Signed)
Call received from patient today, 02/12/15, to inquire about appointment [facility] for knee injury, 02/10/15.  Relayed, as chart notes indicate, that patient was referred by emergency room provider [facility]  Patient states has been treated by Dr Ranell PatrickNorris in past for same knee; however, has outstanding balance there.  He said he was given appointment there for today and made aware of a self-pay amount.  Relayed Dr Romeo AppleHarrison would agree to follow up there since an established patient with Dr Ranell PatrickNorris, as our next available is approximately 2 weeks out.  Patient will call back to Dr Ranell PatrickNorris and try to work it out to keep that appointment as scheduled.

## 2015-03-30 ENCOUNTER — Emergency Department (HOSPITAL_COMMUNITY)
Admission: EM | Admit: 2015-03-30 | Discharge: 2015-03-31 | Disposition: A | Discharge status: Home / Self Care | Payer: Self-pay | Attending: Emergency Medicine | Admitting: Emergency Medicine

## 2015-03-30 ENCOUNTER — Emergency Department (HOSPITAL_COMMUNITY): Payer: Self-pay

## 2015-03-30 ENCOUNTER — Encounter (HOSPITAL_COMMUNITY): Payer: Self-pay | Admitting: *Deleted

## 2015-03-30 DIAGNOSIS — F17210 Nicotine dependence, cigarettes, uncomplicated: Secondary | ICD-10-CM | POA: Insufficient documentation

## 2015-03-30 DIAGNOSIS — I10 Essential (primary) hypertension: Secondary | ICD-10-CM | POA: Insufficient documentation

## 2015-03-30 DIAGNOSIS — K2100 Gastro-esophageal reflux disease with esophagitis, without bleeding: Secondary | ICD-10-CM

## 2015-03-30 DIAGNOSIS — F419 Anxiety disorder, unspecified: Secondary | ICD-10-CM | POA: Insufficient documentation

## 2015-03-30 DIAGNOSIS — J45901 Unspecified asthma with (acute) exacerbation: Secondary | ICD-10-CM | POA: Insufficient documentation

## 2015-03-30 DIAGNOSIS — G8929 Other chronic pain: Secondary | ICD-10-CM | POA: Insufficient documentation

## 2015-03-30 DIAGNOSIS — R079 Chest pain, unspecified: Secondary | ICD-10-CM | POA: Insufficient documentation

## 2015-03-30 DIAGNOSIS — Z79899 Other long term (current) drug therapy: Secondary | ICD-10-CM | POA: Insufficient documentation

## 2015-03-30 DIAGNOSIS — Z880 Allergy status to penicillin: Secondary | ICD-10-CM | POA: Insufficient documentation

## 2015-03-30 LAB — BASIC METABOLIC PANEL
Anion gap: 7 (ref 5–15)
BUN: 20 mg/dL (ref 6–20)
CO2: 26 mmol/L (ref 22–32)
Calcium: 9.2 mg/dL (ref 8.9–10.3)
Chloride: 101 mmol/L (ref 101–111)
Creatinine, Ser: 1.02 mg/dL (ref 0.61–1.24)
GFR calc Af Amer: >60 mL/min (ref >60)
GFR calc non Af Amer: >60 mL/min (ref >60)
Glucose, Bld: 104 mg/dL — ABNORMAL HIGH (ref 65–99)
Potassium: 4 mmol/L (ref 3.5–5.1)
Sodium: 134 mmol/L — ABNORMAL LOW (ref 135–145)

## 2015-03-30 LAB — CBC WITH DIFFERENTIAL/PLATELET
Basophils Absolute: 0 10*3/uL (ref 0.0–0.1)
Basophils Relative: 0 %
Eosinophils Absolute: 0.2 10*3/uL (ref 0.0–0.7)
Eosinophils Relative: 2 %
HCT: 43.1 % (ref 39.0–52.0)
Hemoglobin: 15 g/dL (ref 13.0–17.0)
Lymphocytes Relative: 23 %
Lymphs Abs: 2.4 10*3/uL (ref 0.7–4.0)
MCH: 30.3 pg (ref 26.0–34.0)
MCHC: 34.8 g/dL (ref 30.0–36.0)
MCV: 87.1 fL (ref 78.0–100.0)
Monocytes Absolute: 0.7 10*3/uL (ref 0.1–1.0)
Monocytes Relative: 7 %
Neutro Abs: 7.2 10*3/uL (ref 1.7–7.7)
Neutrophils Relative %: 68 %
Platelets: 291 10*3/uL (ref 150–400)
RBC: 4.95 MIL/uL (ref 4.22–5.81)
RDW: 12.6 % (ref 11.5–15.5)
WBC: 10.5 10*3/uL (ref 4.0–10.5)

## 2015-03-30 LAB — TROPONIN I
Troponin I: <0.03 ng/mL (ref <0.031)
Troponin I: <0.03 ng/mL (ref <0.031)

## 2015-03-30 MED ORDER — HYDROCODONE-ACETAMINOPHEN 5-325 MG PO TABS: 2.0000 | ORAL_TABLET | ORAL | Status: DC | PRN

## 2015-03-30 MED ORDER — OMEPRAZOLE 20 MG PO CPDR: 20.0000 mg | DELAYED_RELEASE_CAPSULE | Freq: Two times a day (BID) | ORAL | Status: DC

## 2015-03-30 MED ORDER — HYDROCODONE-ACETAMINOPHEN 5-325 MG PO TABS
2.0000 | ORAL_TABLET | Freq: Once | ORAL | Status: AC
  Administered 2015-03-30: 2 via ORAL
  Filled 2015-03-30: qty 2

## 2015-03-30 MED ORDER — PANTOPRAZOLE SODIUM 40 MG PO TBEC
40.0000 mg | DELAYED_RELEASE_TABLET | Freq: Once | ORAL | Status: AC
  Administered 2015-03-30: 40 mg via ORAL
  Filled 2015-03-30: qty 1

## 2015-03-30 MED ORDER — ONDANSETRON 4 MG PO TBDP
4.0000 mg | ORAL_TABLET | Freq: Once | ORAL | Status: AC
  Administered 2015-03-30: 4 mg via ORAL
  Filled 2015-03-30: qty 1

## 2015-03-30 NOTE — ED Notes (Signed)
Pt states that he has been eating tums for the past three days, started having left side chest tightness that radiates to left arm yesterday, pain has been intermittent and associated with sob,

## 2015-03-30 NOTE — ED Provider Notes (Signed)
CSN: [id number]     Arrival date & time 03/30/15  1854 History   First MD Initiated Contact with Patient 03/30/15 2110     Chief Complaint  Patient presents with  . Chest Pain      HPI  She presents for evaluation of chest pain and anxiety and nausea. He describes a great illness specific areas of stress in his life in dealing with his job and his boss over the last 3 days. He is a tightness in his chest for that and present since yesterday, last night, and all day today. He had a verbal altercation with his boss work and symptoms got worse. He felt short of breath. He presents here.  Past Medical History  Diagnosis Date  . Hypertension   . Chronic back pain   . Asthma    Past Surgical History  Procedure Laterality Date  . Hernia repair    . Thumb surgery     Family History  Problem Relation Age of Onset  . Hypertension Mother   . Seizures Mother   . Cancer Brother    Social History  Substance Use Topics  . Smoking status: Current Every Day Smoker -- 1.00 packs/day    Types: Cigarettes  . Smokeless tobacco: Current User    Types: Snuff  . Alcohol Use: Yes     Comment: occ    Review of Systems  Constitutional: Negative for fever, chills, diaphoresis, appetite change and fatigue.  HENT: Negative for mouth sores, sore throat and trouble swallowing.   Eyes: Negative for visual disturbance.  Respiratory: Positive for shortness of breath. Negative for cough, chest tightness and wheezing.   Cardiovascular: Positive for chest pain.  Gastrointestinal: Negative for nausea, vomiting, abdominal pain, diarrhea and abdominal distention.  Endocrine: Negative for polydipsia, polyphagia and polyuria.  Genitourinary: Negative for dysuria, frequency and hematuria.  Musculoskeletal: Negative for gait problem.  Skin: Negative for color change, pallor and rash.  Neurological: Negative for dizziness, syncope, light-headedness and headaches.  Hematological: Does not bruise/bleed easily.   Psychiatric/Behavioral: Negative for behavioral problems and confusion. The patient is nervous/anxious.       Allergies  Peanuts; Penicillins; Shellfish allergy; and Flexeril  Home Medications   Prior to Admission medications   Medication Sig Start Date End Date Taking? Authorizing Provider  albuterol (PROVENTIL HFA;VENTOLIN HFA) 108 (90 BASE) MCG/ACT inhaler Inhale 1-2 puffs into the lungs every 6 (six) hours as needed for wheezing or shortness of breath. 12/04/14  Yes Donnetta HutchingBrian Cook, MD  propranolol (INDERAL) 80 MG tablet Take 10 mg by mouth every 4 (four) hours.    Yes Historical Provider, MD  dicyclomine (BENTYL) 20 MG tablet Take 1 tablet (20 mg total) by mouth 2 (two) times daily. Patient not taking: Reported on 02/10/2015 12/30/14   Azalia BilisKevin Campos, MD  HYDROcodone-acetaminophen (NORCO/VICODIN) 5-325 MG tablet Take 2 tablets by mouth every 4 (four) hours as needed. 03/30/15   Rolland PorterMark Icesis Renn, MD  ibuprofen (ADVIL,MOTRIN) 800 MG tablet Take 1 tablet (800 mg total) by mouth 3 (three) times daily. Patient not taking: Reported on 03/30/2015 02/10/15   Burgess AmorJulie Idol, PA-C  meclizine (ANTIVERT) 25 MG tablet Take 1 tablet (25 mg total) by mouth 3 (three) times daily as needed for dizziness. Patient not taking: Reported on 03/30/2015 12/30/14   Azalia BilisKevin Campos, MD  omeprazole (PRILOSEC) 20 MG capsule Take 1 capsule (20 mg total) by mouth 2 (two) times daily. 03/30/15   Rolland PorterMark Gussie Murton, MD  ondansetron (ZOFRAN) 8 MG tablet Take 1  tablet (8 mg total) by mouth every 4 (four) hours as needed. Patient not taking: Reported on 02/10/2015 01/17/15   Donnetta Hutching, MD  oxyCODONE-acetaminophen (PERCOCET/ROXICET) 5-325 MG tablet Take 1 tablet by mouth every 4 (four) hours as needed. Patient not taking: Reported on 03/30/2015 02/10/15   Burgess Amor, PA-C   BP 124/90 mmHg  Pulse 73  Temp(Src) 98 F (36.7 C) (Oral)  Resp 12  Ht  (1.88 m)  Wt 212 lb (96.163 kg)  BMI 27.21 kg/m2  SpO2 99% Physical Exam  Constitutional:  He is oriented to person, place, and time. He appears well-developed and well-nourished. No distress.  HENT:  Head: Normocephalic.  Eyes: Conjunctivae are normal. Pupils are equal, round, and reactive to light. No scleral icterus.  Neck: Normal range of motion. Neck supple. No thyromegaly present.  Cardiovascular: Normal rate and regular rhythm.  Exam reveals no gallop and no friction rub.   No murmur heard. Pulmonary/Chest: Effort normal and breath sounds normal. No respiratory distress. He has no wheezes. He has no rales.  Abdominal: Soft. Bowel sounds are normal. He exhibits no distension. There is no tenderness. There is no rebound.  Musculoskeletal: Normal range of motion.  Neurological: He is alert and oriented to person, place, and time.  Skin: Skin is warm and dry. No rash noted.  Psychiatric: His behavior is normal. His mood appears anxious. His speech is rapid and/or pressured.    ED Course  Procedures (including critical care time) Labs Review Labs Reviewed  BASIC METABOLIC PANEL - Abnormal; Notable for the following:    Sodium 134 (*)    Glucose, Bld 104 (*)    All other components within normal limits  TROPONIN I  CBC WITH DIFFERENTIAL/PLATELET  TROPONIN I    Imaging Review Dg Chest 2 View  03/30/2015  CLINICAL DATA:  29 year old male with left-sided chest pain radiating into the left arm. EXAM: CHEST  2 VIEW COMPARISON:  Prior chest x-ray 12/04/2014 FINDINGS: The lungs are clear and negative for focal airspace consolidation, pulmonary edema or suspicious pulmonary nodule. No pleural effusion or pneumothorax. Cardiac and mediastinal contours are within normal limits. No acute fracture or lytic or blastic osseous lesions. Stable mild dextro convex scoliosis of the mid thoracic spine. The visualized upper abdominal bowel gas pattern is unremarkable. IMPRESSION: Negative chest x-ray. Electronically Signed   By: Malachy Moan M.D.   On: 03/30/2015 19:21   I have  personally reviewed and evaluated these images and lab results as part of my medical decision-making.   EKG Interpretation   Date/Time:  Monday March 30 2015 19:00:27 EST Ventricular Rate:  100 PR Interval:  144 QRS Duration: 92 QT Interval:  344 QTC Calculation: 443 R Axis:   50 Text Interpretation:  Normal sinus rhythm Normal ECG Confirmed by Fayrene Fearing   MD, Maika Kaczmarek ([id number]) on 03/30/2015 9:10:45 PM      MDM   Final diagnoses:  Chest pain, unspecified chest pain type  Anxiety  Gastroesophageal reflux disease with esophagitis    No acute changes on EKG. Repeat shows J-point elevation but no ST changes. Enzymes normal upon arrival and a repeat. He has several episodes of anxiety that reproduces symptoms. Is having some reflux symptoms and nausea as well. Plan is Prilosec, Vicodin, cardiology outpatient follow-up. Primary care regarding anxiety.    Rolland Porter, MD 03/30/15 [phone]

## 2015-03-30 NOTE — ED Notes (Signed)
Pt's family member called out to desk stating pt was having some chest pain and pain to left arm and his throat felt like it was closing up; Dr. Fayrene FearingJames notified and EKG performed and given to Dr. Fayrene FearingJames

## 2015-03-30 NOTE — Discharge Instructions (Signed)
Chest Pain Observation °It is often hard to give a specific diagnosis for the cause of chest pain. Among other possibilities your symptoms might be caused by inadequate oxygen delivery to your heart (angina). Angina that is not treated or evaluated can lead to a heart attack (myocardial infarction) or death. °Blood tests, electrocardiograms, and X-rays may have been done to help determine a possible cause of your chest pain. After evaluation and observation, your health care provider has determined that it is unlikely your pain was caused by an unstable condition that requires hospitalization. However, a full evaluation of your pain may need to be completed, with additional diagnostic testing as directed. It is very important to keep your follow-up appointments. Not keeping your follow-up appointments could result in permanent heart damage, disability, or death. If there is any problem keeping your follow-up appointments, you must call your health care provider. °HOME CARE INSTRUCTIONS  °Due to the slight chance that your pain could be angina, it is important to follow your health care provider's treatment plan and also maintain a healthy lifestyle: °· Maintain or work toward achieving a healthy weight. °· Stay physically active and exercise regularly. °· Decrease your salt intake. °· Eat a balanced, healthy diet. Talk to a dietitian to learn about heart-healthy foods. °· Increase your fiber intake by including whole grains, vegetables, fruits, and nuts in your diet. °· Avoid situations that cause stress, anger, or depression. °· Take medicines as advised by your health care provider. Report any side effects to your health care provider. Do not stop medicines or adjust the dosages on your own. °· Quit smoking. Do not use nicotine patches or gum until you check with your health care provider. °· Keep your blood pressure, blood sugar, and cholesterol levels within normal limits. °· Limit alcohol intake to no more than  1 drink per day for women who are not pregnant and 2 drinks per day for men. °· Do not abuse drugs. °SEEK IMMEDIATE MEDICAL CARE IF: °You have severe chest pain or pressure which may include symptoms such as: °· You feel pain or pressure in your arms, neck, jaw, or back. °· You have severe back or abdominal pain, feel sick to your stomach (nauseous), or throw up (vomit). °· You are sweating profusely. °· You are having a fast or irregular heartbeat. °· You feel short of breath while at rest. °· You notice increasing shortness of breath during rest, sleep, or with activity. °· You have chest pain that does not get better after rest or after taking your usual medicine. °· You wake from sleep with chest pain. °· You are unable to sleep because you cannot breathe. °· You develop a frequent cough or you are coughing up blood. °· You feel dizzy, faint, or experience extreme fatigue. °· You develop severe weakness, dizziness, fainting, or chills. °Any of these symptoms may represent a serious problem that is an emergency. Do not wait to see if the symptoms will go away. Call your local emergency services (911 in the [place] Do not drive yourself to the hospital. °MAKE SURE YOU: °· Understand these instructions. °· Will watch your condition. °· Will get help right away if you are not doing well or get worse. °  °This information is not intended to replace advice given to you by your health care provider. Make sure you discuss any questions you have with your health care provider. °  °Document Released: 05/07/2010 Document Revised: 04/09/2013 Document Reviewed: 10/04/2012 °Elsevier Interactive Patient   Education 2016 [place].  Gastroesophageal Reflux Disease, Adult Normally, food travels down the esophagus and stays in the stomach to be digested. However, when a person has gastroesophageal reflux disease (GERD), food and stomach acid move back up into the esophagus. When this happens, the esophagus becomes sore and  inflamed. Over time, GERD can create small holes (ulcers) in the lining of the esophagus.  CAUSES This condition is caused by a problem with the muscle between the esophagus and the stomach (lower esophageal sphincter, or LES). Normally, the LES muscle closes after food passes through the esophagus to the stomach. When the LES is weakened or abnormal, it does not close properly, and that allows food and stomach acid to go back up into the esophagus. The LES can be weakened by certain dietary substances, medicines, and medical conditions, including:  Tobacco use.  Pregnancy.  Having a hiatal hernia.  Heavy alcohol use.  Certain foods and beverages, such as coffee, chocolate, onions, and peppermint. RISK FACTORS This condition is more likely to develop in:  People who have an increased body weight.  People who have connective tissue disorders.  People who use NSAID medicines. SYMPTOMS Symptoms of this condition include:  Heartburn.  Difficult or painful swallowing.  The feeling of having a lump in the throat.  Abitter taste in the mouth.  Bad breath.  Having a large amount of saliva.  Having an upset or bloated stomach.  Belching.  Chest pain.  Shortness of breath or wheezing.  Ongoing (chronic) cough or a night-time cough.  Wearing away of tooth enamel.  Weight loss. Different conditions can cause chest pain. Make sure to see your health care provider if you experience chest pain. DIAGNOSIS Your health care provider will take a medical history and perform a physical exam. To determine if you have mild or severe GERD, your health care provider may also monitor how you respond to treatment. You may also have other tests, including:  An endoscopy toexamine your stomach and esophagus with a small camera.  A test thatmeasures the acidity level in your esophagus.  A test thatmeasures how much pressure is on your esophagus.  A barium swallow or modified barium  swallow to show the shape, size, and functioning of your esophagus. TREATMENT The goal of treatment is to help relieve your symptoms and to prevent complications. Treatment for this condition may vary depending on how severe your symptoms are. Your health care provider may recommend:  Changes to your diet.  Medicine.  Surgery. HOME CARE INSTRUCTIONS Diet  Follow a diet as recommended by your health care provider. This may involve avoiding foods and drinks such as:  Coffee and tea (with or without caffeine).  Drinks that containalcohol.  Energy drinks and sports drinks.  Carbonated drinks or sodas.  Chocolate and cocoa.  Peppermint and mint flavorings.  Garlic and onions.  Horseradish.  Spicy and acidic foods, including peppers, chili powder, curry powder, vinegar, hot sauces, and barbecue sauce.  Citrus fruit juices and citrus fruits, such as oranges, lemons, and limes.  Tomato-based foods, such as red sauce, chili, salsa, and pizza with red sauce.  Fried and fatty foods, such as donuts, french fries, potato chips, and high-fat dressings.  High-fat meats, such as hot dogs and fatty cuts of red and white meats, such as rib eye steak, sausage, ham, and bacon.  High-fat dairy items, such as whole milk, butter, and cream cheese.  Eat small, frequent meals instead of large meals.  Avoid drinking  large amounts of liquid with your meals.  Avoid eating meals during the 2-3 hours before bedtime.  Avoid lying down right after you eat.  Do not exercise right after you eat. General Instructions  Pay attention to any changes in your symptoms.  Take over-the-counter and prescription medicines only as told by your health care provider. Do not take aspirin, ibuprofen, or other NSAIDs unless your health care provider told you to do so.  Do not use any tobacco products, including cigarettes, chewing tobacco, and e-cigarettes. If you need help quitting, ask your health care  provider.  Wear loose-fitting clothing. Do not wear anything tight around your waist that causes pressure on your abdomen.  Raise (elevate) the head of your bed 6 inches (15cm).  Try to reduce your stress, such as with yoga or meditation. If you need help reducing stress, ask your health care provider.  If you are overweight, reduce your weight to an amount that is healthy for you. Ask your health care provider for guidance about a safe weight loss goal.  Keep all follow-up visits as told by your health care provider. This is important. SEEK MEDICAL CARE IF:  You have new symptoms.  You have unexplained weight loss.  You have difficulty swallowing, or it hurts to swallow.  You have wheezing or a persistent cough.  Your symptoms do not improve with treatment.  You have a hoarse voice. SEEK IMMEDIATE MEDICAL CARE IF:  You have pain in your arms, neck, jaw, teeth, or back.  You feel sweaty, dizzy, or light-headed.  You have chest pain or shortness of breath.  You vomit and your vomit looks like blood or coffee grounds.  You faint.  Your stool is bloody or black.  You cannot swallow, drink, or eat.   This information is not intended to replace advice given to you by your health care provider. Make sure you discuss any questions you have with your health care provider.   Document Released: 01/12/2005 Document Revised: 12/24/2014 Document Reviewed: 07/30/2014 Elsevier Interactive Patient Education 2016 [employer]  Panic Attacks Panic attacks are sudden, short feelings of great fear or discomfort. You may have them for no reason when you are relaxed, when you are uneasy (anxious), or when you are sleeping.  HOME CARE  Take all your medicines as told.  Check with your doctor before starting new medicines.  Keep all doctor visits. GET HELP IF:  You are not able to take your medicines as told.  Your symptoms do not get better.  Your symptoms get worse. GET HELP  RIGHT AWAY IF:  Your attacks seem different than your normal attacks.  You have thoughts about hurting yourself or others.  You take panic attack medicine and you have a side effect. MAKE SURE YOU:  Understand these instructions.  Will watch your condition.  Will get help right away if you are not doing well or get worse.   This information is not intended to replace advice given to you by your health care provider. Make sure you discuss any questions you have with your health care provider.   Document Released: 05/07/2010 Document Revised: 01/23/2013 Document Reviewed: 11/16/2012 Elsevier Interactive Patient Education [employer]

## 2015-04-22 ENCOUNTER — Emergency Department (HOSPITAL_COMMUNITY)
Admission: EM | Admit: 2015-04-22 | Discharge: 2015-04-22 | Disposition: A | Discharge status: Home / Self Care | Payer: Self-pay | Attending: Emergency Medicine | Admitting: Emergency Medicine

## 2015-04-22 ENCOUNTER — Encounter (HOSPITAL_COMMUNITY): Payer: Self-pay | Admitting: Emergency Medicine

## 2015-04-22 ENCOUNTER — Emergency Department (HOSPITAL_COMMUNITY): Payer: Self-pay

## 2015-04-22 DIAGNOSIS — S161XXA Strain of muscle, fascia and tendon at neck level, initial encounter: Secondary | ICD-10-CM | POA: Insufficient documentation

## 2015-04-22 DIAGNOSIS — Y990 Civilian activity done for income or pay: Secondary | ICD-10-CM | POA: Insufficient documentation

## 2015-04-22 DIAGNOSIS — J45909 Unspecified asthma, uncomplicated: Secondary | ICD-10-CM | POA: Insufficient documentation

## 2015-04-22 DIAGNOSIS — Z880 Allergy status to penicillin: Secondary | ICD-10-CM | POA: Insufficient documentation

## 2015-04-22 DIAGNOSIS — W228XXA Striking against or struck by other objects, initial encounter: Secondary | ICD-10-CM | POA: Insufficient documentation

## 2015-04-22 DIAGNOSIS — Z79899 Other long term (current) drug therapy: Secondary | ICD-10-CM | POA: Insufficient documentation

## 2015-04-22 DIAGNOSIS — G8929 Other chronic pain: Secondary | ICD-10-CM | POA: Insufficient documentation

## 2015-04-22 DIAGNOSIS — F17210 Nicotine dependence, cigarettes, uncomplicated: Secondary | ICD-10-CM | POA: Insufficient documentation

## 2015-04-22 DIAGNOSIS — Y9389 Activity, other specified: Secondary | ICD-10-CM | POA: Insufficient documentation

## 2015-04-22 DIAGNOSIS — Y9289 Other specified places as the place of occurrence of the external cause: Secondary | ICD-10-CM | POA: Insufficient documentation

## 2015-04-22 DIAGNOSIS — I10 Essential (primary) hypertension: Secondary | ICD-10-CM | POA: Insufficient documentation

## 2015-04-22 MED ORDER — IBUPROFEN 800 MG PO TABS
800.0000 mg | ORAL_TABLET | Freq: Once | ORAL | Status: AC
  Administered 2015-04-22: 800 mg via ORAL
  Filled 2015-04-22: qty 1

## 2015-04-22 MED ORDER — OXYCODONE-ACETAMINOPHEN 5-325 MG PO TABS
1.0000 | ORAL_TABLET | Freq: Once | ORAL | Status: AC
  Administered 2015-04-22: 1 via ORAL
  Filled 2015-04-22: qty 1

## 2015-04-22 MED ORDER — IBUPROFEN 800 MG PO TABS: 800.0000 mg | ORAL_TABLET | Freq: Three times a day (TID) | ORAL | Status: DC

## 2015-04-22 MED ORDER — OXYCODONE-ACETAMINOPHEN 5-325 MG PO TABS: 1.0000 | ORAL_TABLET | ORAL | Status: DC | PRN

## 2015-04-22 NOTE — Discharge Instructions (Signed)

## 2015-04-22 NOTE — ED Notes (Signed)
Patient states he fell backwards and hit the back of his neck on some pipes at work today. Complaining of pain to neck when attempting to turn his head to the side.

## 2015-04-26 NOTE — ED Provider Notes (Signed)
CSN: [id number]     Arrival date & time 04/22/15  1933 History   First MD Initiated Contact with Patient 04/22/15 1939     Chief Complaint  Patient presents with  . Fall  . Neck Pain     (Consider location/radiation/quality/duration/timing/severity/associated sxs/prior Treatment) HPI   Eric Lowery is a 30 y.o. male who presents to the Emergency Department complaining of neck pain for several hours secondary to a fall.  He states the injury occurred at work when he accidentally fell back striking  The back of his neck on some metal pipes.  Pain is worse with rotation of his neck.  States pain has progressed since onset.  He denies head injury, LOC, headaches, dizziness, vomiting and visual changes.  He has not tried any medications prior to arrival.  Past Medical History  Diagnosis Date  . Hypertension   . Chronic back pain   . Asthma    Past Surgical History  Procedure Laterality Date  . Hernia repair    . Thumb surgery     Family History  Problem Relation Age of Onset  . Hypertension Mother   . Seizures Mother   . Cancer Brother    Social History  Substance Use Topics  . Smoking status: Current Every Day Smoker -- 1.00 packs/day    Types: Cigarettes  . Smokeless tobacco: Current User    Types: Snuff  . Alcohol Use: Yes     Comment: occ    Review of Systems  Constitutional: Negative for fever and chills.  Respiratory: Negative for shortness of breath.   Cardiovascular: Negative for chest pain.  Gastrointestinal: Negative for nausea and vomiting.  Genitourinary: Negative for difficulty urinating.  Musculoskeletal: Positive for neck pain. Negative for joint swelling.  Skin: Negative for color change and wound.  Neurological: Negative for dizziness, syncope, weakness, numbness and headaches.  All other systems reviewed and are negative.     Allergies  Peanuts; Penicillins; Shellfish allergy; and Flexeril  Home Medications   Prior to Admission medications    Medication Sig Start Date End Date Taking? Authorizing Provider  albuterol (PROVENTIL HFA;VENTOLIN HFA) 108 (90 BASE) MCG/ACT inhaler Inhale 1-2 puffs into the lungs every 6 (six) hours as needed for wheezing or shortness of breath. 12/04/14  Yes Donnetta HutchingBrian Cook, MD  propranolol (INDERAL) 80 MG tablet Take 10-40 mg by mouth every 4 (four) hours.    Yes Historical Provider, MD  ibuprofen (ADVIL,MOTRIN) 800 MG tablet Take 1 tablet (800 mg total) by mouth 3 (three) times daily. 04/22/15   Judi Jaffe, PA-C  ondansetron (ZOFRAN) 8 MG tablet Take 1 tablet (8 mg total) by mouth every 4 (four) hours as needed. Patient not taking: Reported on 02/10/2015 01/17/15   Donnetta HutchingBrian Cook, MD  oxyCODONE-acetaminophen (PERCOCET/ROXICET) 5-325 MG tablet Take 1 tablet by mouth every 4 (four) hours as needed. 04/22/15   Nelia Rogoff, PA-C   BP 161/81 mmHg  Pulse 78  Temp(Src) 97.9 F (36.6 C) (Oral)  Resp 20  Ht 6\' 2"  (1.88 m)  Wt 95.255 kg  BMI 26.95 kg/m2  SpO2 100% Physical Exam  Constitutional: He is oriented to person, place, and time. He appears well-developed and well-nourished. No distress.  HENT:  Head: Atraumatic.  Mouth/Throat: Oropharynx is clear and moist.  Eyes: EOM are normal. Pupils are equal, round, and reactive to light.  Cardiovascular: Normal rate, regular rhythm and intact distal pulses.   Pulmonary/Chest: Effort normal and breath sounds normal.  Abdominal: Soft. He exhibits no  distension. There is no tenderness.  Musculoskeletal: Normal range of motion. He exhibits tenderness.       Cervical back: He exhibits tenderness and bony tenderness. He exhibits normal pulse.  Diffuse ttp of the cervical spine and bilateral cervical paraspinal muscles.  No step off deformity, 5/5 strength against resistance of the bilateral UE's.  Sensation intact  Neurological: He is alert and oriented to person, place, and time. He has normal strength. Coordination and gait normal.  Reflex Scores:      Tricep reflexes  are 2+ on the right side and 2+ on the left side.      Bicep reflexes are 2+ on the right side and 2+ on the left side. Skin: Skin is warm. No rash noted.  Psychiatric: He has a normal mood and affect.  Nursing note and vitals reviewed.   ED Course  Procedures (including critical care time) Labs Review Labs Reviewed - No data to display  Imaging Review Dg Cervical Spine Complete  04/22/2015  CLINICAL DATA:  Pain following fall from ladder EXAM: CERVICAL SPINE - COMPLETE 4+ VIEW COMPARISON:  Cervical spine CT August 09, 2010 FINDINGS: Frontal, lateral, open-mouth odontoid, and bilateral oblique views were obtained. There is no fracture or spondylolisthesis. Prevertebral soft tissues and predental space regions are normal. Disc spaces appear unremarkable. There is no exit foraminal narrowing on the oblique views. IMPRESSION: No fracture or spondylolisthesis.  No apparent arthropathy. Electronically Signed   By: Bretta Bang III M.D.   On: 04/22/2015 20:15     I have personally reviewed and evaluated these images and lab results as part of my medical decision-making.   EKG Interpretation None      MDM   Final diagnoses:  Cervical strain, acute, initial encounter   Pt is ambulatory, no focal neuro deficits. Likely strain but pt advised to arrange PMD f/u or return here for any worsening sx's.  rx for percocet and ibuprofen.     Pauline Aus, PA-C 04/26/15 0112  Bethann Berkshire, MD 04/27/15 1524

## 2015-05-07 ENCOUNTER — Emergency Department (HOSPITAL_COMMUNITY): Payer: Self-pay

## 2015-05-07 ENCOUNTER — Encounter (HOSPITAL_COMMUNITY): Payer: Self-pay

## 2015-05-07 ENCOUNTER — Emergency Department (HOSPITAL_COMMUNITY)
Admission: EM | Admit: 2015-05-07 | Discharge: 2015-05-07 | Disposition: A | Discharge status: Home / Self Care | Payer: Self-pay | Attending: Emergency Medicine | Admitting: Emergency Medicine

## 2015-05-07 DIAGNOSIS — F17210 Nicotine dependence, cigarettes, uncomplicated: Secondary | ICD-10-CM | POA: Insufficient documentation

## 2015-05-07 DIAGNOSIS — W11XXXA Fall on and from ladder, initial encounter: Secondary | ICD-10-CM | POA: Insufficient documentation

## 2015-05-07 DIAGNOSIS — Z79899 Other long term (current) drug therapy: Secondary | ICD-10-CM | POA: Insufficient documentation

## 2015-05-07 DIAGNOSIS — Y9289 Other specified places as the place of occurrence of the external cause: Secondary | ICD-10-CM | POA: Insufficient documentation

## 2015-05-07 DIAGNOSIS — J45909 Unspecified asthma, uncomplicated: Secondary | ICD-10-CM | POA: Insufficient documentation

## 2015-05-07 DIAGNOSIS — Z760 Encounter for issue of repeat prescription: Secondary | ICD-10-CM | POA: Insufficient documentation

## 2015-05-07 DIAGNOSIS — M545 Low back pain, unspecified: Secondary | ICD-10-CM

## 2015-05-07 DIAGNOSIS — Z880 Allergy status to penicillin: Secondary | ICD-10-CM | POA: Insufficient documentation

## 2015-05-07 DIAGNOSIS — I10 Essential (primary) hypertension: Secondary | ICD-10-CM | POA: Insufficient documentation

## 2015-05-07 DIAGNOSIS — W19XXXA Unspecified fall, initial encounter: Secondary | ICD-10-CM

## 2015-05-07 DIAGNOSIS — Y9389 Activity, other specified: Secondary | ICD-10-CM | POA: Insufficient documentation

## 2015-05-07 DIAGNOSIS — Y990 Civilian activity done for income or pay: Secondary | ICD-10-CM | POA: Insufficient documentation

## 2015-05-07 DIAGNOSIS — S161XXA Strain of muscle, fascia and tendon at neck level, initial encounter: Secondary | ICD-10-CM | POA: Insufficient documentation

## 2015-05-07 DIAGNOSIS — G8929 Other chronic pain: Secondary | ICD-10-CM | POA: Insufficient documentation

## 2015-05-07 DIAGNOSIS — S3992XA Unspecified injury of lower back, initial encounter: Secondary | ICD-10-CM | POA: Insufficient documentation

## 2015-05-07 DIAGNOSIS — Z791 Long term (current) use of non-steroidal anti-inflammatories (NSAID): Secondary | ICD-10-CM | POA: Insufficient documentation

## 2015-05-07 MED ORDER — OXYCODONE-ACETAMINOPHEN 5-325 MG PO TABS: 1.0000 | ORAL_TABLET | ORAL | Status: DC | PRN

## 2015-05-07 MED ORDER — KETOROLAC TROMETHAMINE 60 MG/2ML IM SOLN
60.0000 mg | Freq: Once | INTRAMUSCULAR | Status: AC
  Administered 2015-05-07: 60 mg via INTRAMUSCULAR
  Filled 2015-05-07: qty 2

## 2015-05-07 MED ORDER — PROPRANOLOL HCL 80 MG PO TABS: 80.0000 mg | ORAL_TABLET | Freq: Every day | ORAL | Status: DC

## 2015-05-07 MED ORDER — OXYCODONE-ACETAMINOPHEN 5-325 MG PO TABS
1.0000 | ORAL_TABLET | Freq: Once | ORAL | Status: AC
  Administered 2015-05-07: 1 via ORAL
  Filled 2015-05-07: qty 1

## 2015-05-07 NOTE — ED Notes (Signed)
Patient states he fell 10 feet landing on his back and hitting head. Patient c/o back, neck an head pain with shortness of breath. Patient is A&OX4 at this time and ambulatory into triage.

## 2015-05-07 NOTE — Discharge Instructions (Signed)
Back Pain, Adult °Back pain is very common in adults. The cause of back pain is rarely dangerous and the pain often gets better over time. The cause of your back pain may not be known. Some common causes of back pain include: °· Strain of the muscles or ligaments supporting the spine. °· Wear and tear (degeneration) of the spinal disks. °· Arthritis. °· Direct injury to the back. °For many people, back pain may return. Since back pain is rarely dangerous, most people can learn to manage this condition on their own. °HOME CARE INSTRUCTIONS °Watch your back pain for any changes. The following actions may help to lessen any discomfort you are feeling: °· Remain active. It is stressful on your back to sit or stand in one place for long periods of time. Do not sit, drive, or stand in one place for more than 30 minutes at a time. Take short walks on even surfaces as soon as you are able. Try to increase the length of time you walk each day. °· Exercise regularly as directed by your health care provider. Exercise helps your back heal faster. It also helps avoid future injury by keeping your muscles strong and flexible. °· Do not stay in bed. Resting more than 1-2 days can delay your recovery. °· Pay attention to your body when you bend and lift. The most comfortable positions are those that put less stress on your recovering back. Always use proper lifting techniques, including: °· Bending your knees. °· Keeping the load close to your body. °· Avoiding twisting. °· Find a comfortable position to sleep. Use a firm mattress and lie on your side with your knees slightly bent. If you lie on your back, put a pillow under your knees. °· Avoid feeling anxious or stressed. Stress increases muscle tension and can worsen back pain. It is important to recognize when you are anxious or stressed and learn ways to manage it, such as with exercise. °· Take medicines only as directed by your health care provider. Over-the-counter  medicines to reduce pain and inflammation are often the most helpful. Your health care provider may prescribe muscle relaxant drugs. These medicines help dull your pain so you can more quickly return to your normal activities and healthy exercise. °· Apply ice to the injured area: °· Put ice in a plastic bag. °· Place a towel between your skin and the bag. °· Leave the ice on for 20 minutes, 2-3 times a day for the first 2-3 days. After that, ice and heat may be alternated to reduce pain and spasms. °· Maintain a healthy weight. Excess weight puts extra stress on your back and makes it difficult to maintain good posture. °SEEK MEDICAL CARE IF: °· You have pain that is not relieved with rest or medicine. °· You have increasing pain going down into the legs or buttocks. °· You have pain that does not improve in one week. °· You have night pain. °· You lose weight. °· You have a fever or chills. °SEEK IMMEDIATE MEDICAL CARE IF:  °· You develop new bowel or bladder control problems. °· You have unusual weakness or numbness in your arms or legs. °· You develop nausea or vomiting. °· You develop abdominal pain. °· You feel faint. °  °This information is not intended to replace advice given to you by your health care provider. Make sure you discuss any questions you have with your health care provider. °  °Document Released: 04/04/2005 Document Revised: 04/25/2014 Document Reviewed: 08/06/2013 °Elsevier Interactive Patient Education ©2016 [employer]  Cervical Sprain A cervical sprain is when the tissues (ligaments) that hold the neck bones in place stretch or tear. HOME CARE   Put ice on the injured area.  Put ice in a plastic bag.  Place a towel between your skin and the bag.  Leave the ice on for 15-20 minutes, 3-4 times a day.  You may have been given a collar to wear. This collar keeps your neck from moving while you heal.  Do not take the collar off unless told by your doctor.  If you have long  hair, keep it outside of the collar.  Ask your doctor before changing the position of your collar. You may need to change its position over time to make it more comfortable.  If you are allowed to take off the collar for cleaning or bathing, follow your doctor's instructions on how to do it safely.  Keep your collar clean by wiping it with mild soap and water. Dry it completely. If the collar has removable pads, remove them every 1-2 days to hand wash them with soap and water. Allow them to air dry. They should be dry before you wear them in the collar.  Do not drive while wearing the collar.  Only take medicine as told by your doctor.  Keep all doctor visits as told.  Keep all physical therapy visits as told.  Adjust your work station so that you have good posture while you work.  Avoid positions and activities that make your problems worse.  Warm up and stretch before being active. GET HELP IF:  Your pain is not controlled with medicine.  You cannot take less pain medicine over time as planned.  Your activity level does not improve as expected. GET HELP RIGHT AWAY IF:   You are bleeding.  Your stomach is upset.  You have an allergic reaction to your medicine.  You develop new problems that you cannot explain.  You lose feeling (become numb) or you cannot move any part of your body (paralysis).  You have tingling or weakness in any part of your body.  Your symptoms get worse. Symptoms include:  Pain, soreness, stiffness, puffiness (swelling), or a burning feeling in your neck.  Pain when your neck is touched.  Shoulder or upper back pain.  Limited ability to move your neck.  Headache.  Dizziness.  Your hands or arms feel week, lose feeling, or tingle.  Muscle spasms.  Difficulty swallowing or chewing. MAKE SURE YOU:   Understand these instructions.  Will watch your condition.  Will get help right away if you are not doing well or get worse.   This  information is not intended to replace advice given to you by your health care provider. Make sure you discuss any questions you have with your health care provider.   Document Released: 09/21/2007 Document Revised: 12/05/2012 Document Reviewed: 10/10/2012 Elsevier Interactive Patient Education [employer]

## 2015-05-12 NOTE — ED Provider Notes (Signed)
CSN: [id number]     Arrival date & time 05/07/15  1848 History   First MD Initiated Contact with Patient 05/07/15 2003     Chief Complaint  Patient presents with  . Fall     (Consider location/radiation/quality/duration/timing/severity/associated sxs/prior Treatment) HPI   Eric Lowery is a 30 y.o. male who presents to the Emergency Department complaining of back and neck pain and head injury.  He states that he fell back at work off a ladder, falling approximately 9-10 feet.  He states that he got up on his own, denies LOC, visual changes, dizziness, N/V, he states the injury occurred several hors prior to arrival.  He has not taken any medication for symptom relief.  He also denies other injuries.  Pt was seen here 2 weeks ago for similar injury which he states improved after few days.    Past Medical History  Diagnosis Date  . Hypertension   . Chronic back pain   . Asthma    Past Surgical History  Procedure Laterality Date  . Hernia repair    . Thumb surgery     Family History  Problem Relation Age of Onset  . Hypertension Mother   . Seizures Mother   . Cancer Brother    Social History  Substance Use Topics  . Smoking status: Current Every Day Smoker -- 1.00 packs/day    Types: Cigarettes  . Smokeless tobacco: Current User    Types: Snuff  . Alcohol Use: Yes     Comment: occ    Review of Systems  Constitutional: Negative for fever and chills.  HENT: Negative for trouble swallowing.   Eyes: Negative for visual disturbance.  Respiratory: Negative for cough and shortness of breath.   Cardiovascular: Negative for chest pain.  Gastrointestinal: Negative for nausea, vomiting and abdominal pain.  Genitourinary: Negative for dysuria, hematuria, decreased urine volume and difficulty urinating.  Musculoskeletal: Positive for back pain and neck pain. Negative for joint swelling and arthralgias.  Skin: Negative for color change and wound.  Neurological: Negative for  dizziness, syncope, speech difficulty, weakness and headaches.  All other systems reviewed and are negative.     Allergies  Peanuts; Penicillins; Shellfish allergy; and Flexeril  Home Medications   Prior to Admission medications   Medication Sig Start Date End Date Taking? Authorizing Provider  albuterol (PROVENTIL HFA;VENTOLIN HFA) 108 (90 BASE) MCG/ACT inhaler Inhale 1-2 puffs into the lungs every 6 (six) hours as needed for wheezing or shortness of breath. Patient not taking: Reported on 05/07/2015 12/04/14   Donnetta Hutching, MD  ibuprofen (ADVIL,MOTRIN) 800 MG tablet Take 1 tablet (800 mg total) by mouth 3 (three) times daily. 04/22/15   Keng Jewel, PA-C  ondansetron (ZOFRAN) 8 MG tablet Take 1 tablet (8 mg total) by mouth every 4 (four) hours as needed. Patient not taking: Reported on 02/10/2015 01/17/15   Donnetta Hutching, MD  oxyCODONE-acetaminophen (PERCOCET/ROXICET) 5-325 MG tablet Take 1 tablet by mouth every 4 (four) hours as needed. 05/07/15   Sherelle Castelli, PA-C  propranolol (INDERAL) 80 MG tablet Take 1 tablet (80 mg total) by mouth daily. 05/07/15   Asusena Sigley, PA-C   BP 120/81 mmHg  Pulse 71  Temp(Src) 98.5 F (36.9 C) (Temporal)  Resp 16  Ht  (1.88 m)  Wt 96.616 kg  BMI 27.34 kg/m2  SpO2 99% Physical Exam  Constitutional: He is oriented to person, place, and time. He appears well-developed and well-nourished. No distress.  HENT:  Head: Normocephalic.  Right Ear: Tympanic membrane and ear canal normal. No hemotympanum.  Left Ear: Tympanic membrane and ear canal normal. No hemotympanum.  Mouth/Throat: Uvula is midline, oropharynx is clear and moist and mucous membranes are normal.  Eyes: Conjunctivae and EOM are normal. Pupils are equal, round, and reactive to light.  Neck: Phonation normal. Spinous process tenderness and muscular tenderness present.  Diffuse ttp of the c spine.  No edema, no bony step offs. c collar applied at triage  Cardiovascular: Normal rate,  regular rhythm, normal heart sounds and intact distal pulses.   No murmur heard. Pulmonary/Chest: Effort normal and breath sounds normal. He exhibits no tenderness.  Abdominal: Soft. He exhibits no distension. There is no tenderness. There is no rebound.  Musculoskeletal: He exhibits tenderness. He exhibits no edema.  Diffuse ttp of the entire spine and bilateral paraspinal muscles.  No bony step offs, abrasions or ecchymosis.   5/5 strength against resistance of the bilateral upper and lower extremities.  Neurological: He is alert and oriented to person, place, and time. He has normal strength. No sensory deficit. Coordination and gait normal. GCS eye subscore is 4. GCS verbal subscore is 5. GCS motor subscore is 6.  Reflex Scores:      Tricep reflexes are 2+ on the right side and 2+ on the left side.      Bicep reflexes are 2+ on the right side and 2+ on the left side.      Patellar reflexes are 2+ on the right side and 2+ on the left side.      Achilles reflexes are 2+ on the right side and 2+ on the left side. Skin: Skin is warm and dry.  Psychiatric: He has a normal mood and affect. His behavior is normal. Thought content normal.  Nursing note and vitals reviewed.   ED Course  Procedures (including critical care time) Labs Review Labs Reviewed - No data to display  Imaging Review  Dg Thoracic Spine W/swimmers  05/07/2015  CLINICAL DATA:  30 year old male with acute thoracic spine pain following fall today. Initial encounter. EXAM: THORACIC SPINE - 3 VIEWS COMPARISON:  03/30/2015 prior chest radiographs FINDINGS: A minimal apex right midthoracic scoliosis is unchanged. There is no evidence of acute fracture or subluxation. No focal bony lesions are present. IMPRESSION: No acute abnormalities. Electronically Signed   By: Harmon Pier M.D.   On: 05/07/2015 21:01   Dg Lumbar Spine Complete  05/07/2015  CLINICAL DATA:  Status post fall 10 feet, landing on lower back. Lower back pain.  Initial encounter. EXAM: LUMBAR SPINE - COMPLETE 4+ VIEW COMPARISON:  CT of the abdomen and pelvis from 01/17/2015 FINDINGS: There is no evidence of fracture or subluxation. Vertebral bodies demonstrate normal height and alignment. Intervertebral disc spaces are preserved. The visualized neural foramina are grossly unremarkable in appearance. The visualized bowel gas pattern is unremarkable in appearance; air and stool are noted within the colon. The sacroiliac joints are within normal limits. IMPRESSION: No evidence of fracture or subluxation along the lumbar spine. Electronically Signed   By: Roanna Raider M.D.   On: 05/07/2015 20:56   Ct Head Wo Contrast  05/07/2015  CLINICAL DATA:  Status post fall 10 feet, hitting back of head. Headache and posterior neck pain. Initial encounter. EXAM: CT HEAD WITHOUT CONTRAST CT CERVICAL SPINE WITHOUT CONTRAST TECHNIQUE: Multidetector CT imaging of the head and cervical spine was performed following the standard protocol without intravenous contrast. Multiplanar CT image reconstructions of the cervical spine were  also generated. COMPARISON:  CT of the head and cervical spine performed 08/09/2010, and cervical spine radiographs performed 04/22/2015 FINDINGS: CT HEAD FINDINGS There is no evidence of acute infarction, mass lesion, or intra- or extra-axial hemorrhage on CT. The posterior fossa, including the cerebellum, brainstem and fourth ventricle, is within normal limits. The third and lateral ventricles, and basal ganglia are unremarkable in appearance. The cerebral hemispheres are symmetric in appearance, with normal gray-white differentiation. No mass effect or midline shift is seen. There is no evidence of fracture; visualized osseous structures are unremarkable in appearance. The orbits are within normal limits. There is opacification of the right mastoid air cells. The paranasal sinuses and left mastoid air cells are well-aerated. No significant soft tissue  abnormalities are seen. CT CERVICAL SPINE FINDINGS There is no evidence of fracture or subluxation. Vertebral bodies demonstrate normal height and alignment. Intervertebral disc spaces are preserved. Prevertebral soft tissues are within normal limits. The visualized neural foramina are grossly unremarkable. There is incomplete fusion of the posterior arch of C1. The visualized portions of the thyroid gland are unremarkable in appearance. The visualized lung apices are clear. No significant soft tissue abnormalities are seen. IMPRESSION: 1. No evidence of traumatic intracranial injury or fracture. 2. No evidence of fracture or subluxation along the cervical spine. 3. Opacification of the right mastoid air cells. Electronically Signed   By: Roanna Raider M.D.   On: 05/07/2015 21:01   Ct Cervical Spine Wo Contrast  05/07/2015  CLINICAL DATA:  Status post fall 10 feet, hitting back of head. Headache and posterior neck pain. Initial encounter. EXAM: CT HEAD WITHOUT CONTRAST CT CERVICAL SPINE WITHOUT CONTRAST TECHNIQUE: Multidetector CT imaging of the head and cervical spine was performed following the standard protocol without intravenous contrast. Multiplanar CT image reconstructions of the cervical spine were also generated. COMPARISON:  CT of the head and cervical spine performed 08/09/2010, and cervical spine radiographs performed 04/22/2015 FINDINGS: CT HEAD FINDINGS There is no evidence of acute infarction, mass lesion, or intra- or extra-axial hemorrhage on CT. The posterior fossa, including the cerebellum, brainstem and fourth ventricle, is within normal limits. The third and lateral ventricles, and basal ganglia are unremarkable in appearance. The cerebral hemispheres are symmetric in appearance, with normal gray-white differentiation. No mass effect or midline shift is seen. There is no evidence of fracture; visualized osseous structures are unremarkable in appearance. The orbits are within normal limits.  There is opacification of the right mastoid air cells. The paranasal sinuses and left mastoid air cells are well-aerated. No significant soft tissue abnormalities are seen. CT CERVICAL SPINE FINDINGS There is no evidence of fracture or subluxation. Vertebral bodies demonstrate normal height and alignment. Intervertebral disc spaces are preserved. Prevertebral soft tissues are within normal limits. The visualized neural foramina are grossly unremarkable. There is incomplete fusion of the posterior arch of C1. The visualized portions of the thyroid gland are unremarkable in appearance. The visualized lung apices are clear. No significant soft tissue abnormalities are seen. IMPRESSION: 1. No evidence of traumatic intracranial injury or fracture. 2. No evidence of fracture or subluxation along the cervical spine. 3. Opacification of the right mastoid air cells. Electronically Signed   By: Roanna Raider M.D.   On: 05/07/2015 21:01    I have personally reviewed and evaluated these images and lab results as part of my medical decision-making.   EKG Interpretation None      MDM   Final diagnoses:  Cervical strain, acute, initial encounter  Left-sided  low back pain without sciatica  Fall, initial encounter  Medication refill    Pt is well appearing.  Vitals stable.  Ambulates with a steady gait.  He was seen here recent for similar injury.  Scans and XR's are reassuring.  No fx's. no focal neuro deficits.  Appears stable for d/c.  Pt agrees to close PMD f/u.  Return precautions given.  rx for percocet,  Pt also asks for temporary refill of his BP medication until he can f/u with his PMD.  rx for small amt of inderal prescribed.      Pauline Aus, PA-C 05/12/15 1727  Bethann Berkshire, MD 05/13/15 [phone]

## 2015-07-17 ENCOUNTER — Emergency Department (HOSPITAL_COMMUNITY)
Admission: EM | Admit: 2015-07-17 | Discharge: 2015-07-17 | Disposition: A | Discharge status: Home / Self Care | Payer: Self-pay | Attending: Dermatology | Admitting: Dermatology

## 2015-07-17 ENCOUNTER — Encounter (HOSPITAL_COMMUNITY): Payer: Self-pay | Admitting: Emergency Medicine

## 2015-07-17 DIAGNOSIS — F17210 Nicotine dependence, cigarettes, uncomplicated: Secondary | ICD-10-CM | POA: Insufficient documentation

## 2015-07-17 DIAGNOSIS — R197 Diarrhea, unspecified: Secondary | ICD-10-CM | POA: Insufficient documentation

## 2015-07-17 DIAGNOSIS — J45909 Unspecified asthma, uncomplicated: Secondary | ICD-10-CM | POA: Insufficient documentation

## 2015-07-17 DIAGNOSIS — Z5321 Procedure and treatment not carried out due to patient leaving prior to being seen by health care provider: Secondary | ICD-10-CM | POA: Insufficient documentation

## 2015-07-17 DIAGNOSIS — I10 Essential (primary) hypertension: Secondary | ICD-10-CM | POA: Insufficient documentation

## 2015-07-17 LAB — CBC WITH DIFFERENTIAL/PLATELET
Basophils Absolute: 0 10*3/uL (ref 0.0–0.1)
Basophils Relative: 0 %
Eosinophils Absolute: 0.1 10*3/uL (ref 0.0–0.7)
Eosinophils Relative: 1 %
HCT: 46.6 % (ref 39.0–52.0)
Hemoglobin: 16.2 g/dL (ref 13.0–17.0)
Lymphocytes Relative: 4 %
Lymphs Abs: 0.5 10*3/uL — ABNORMAL LOW (ref 0.7–4.0)
MCH: 29.8 pg (ref 26.0–34.0)
MCHC: 34.8 g/dL (ref 30.0–36.0)
MCV: 85.7 fL (ref 78.0–100.0)
Monocytes Absolute: 0.9 10*3/uL (ref 0.1–1.0)
Monocytes Relative: 7 %
Neutro Abs: 11.5 10*3/uL — ABNORMAL HIGH (ref 1.7–7.7)
Neutrophils Relative %: 88 %
Platelets: 269 10*3/uL (ref 150–400)
RBC: 5.44 MIL/uL (ref 4.22–5.81)
RDW: 12.7 % (ref 11.5–15.5)
WBC: 13.1 10*3/uL — ABNORMAL HIGH (ref 4.0–10.5)

## 2015-07-17 LAB — COMPREHENSIVE METABOLIC PANEL
ALT: 15 U/L — ABNORMAL LOW (ref 17–63)
AST: 25 U/L (ref 15–41)
Albumin: 4.7 g/dL (ref 3.5–5.0)
Alkaline Phosphatase: 88 U/L (ref 38–126)
Anion gap: 8 (ref 5–15)
BUN: 23 mg/dL — ABNORMAL HIGH (ref 6–20)
CO2: 25 mmol/L (ref 22–32)
Calcium: 9 mg/dL (ref 8.9–10.3)
Chloride: 103 mmol/L (ref 101–111)
Creatinine, Ser: 0.97 mg/dL (ref 0.61–1.24)
GFR calc Af Amer: >60 mL/min (ref >60)
GFR calc non Af Amer: >60 mL/min (ref >60)
Glucose, Bld: 89 mg/dL (ref 65–99)
Potassium: 4.2 mmol/L (ref 3.5–5.1)
Sodium: 136 mmol/L (ref 135–145)
Total Bilirubin: 1 mg/dL (ref 0.3–1.2)
Total Protein: 7.7 g/dL (ref 6.5–8.1)

## 2015-07-17 LAB — URINALYSIS, ROUTINE W REFLEX MICROSCOPIC
Bilirubin Urine: NEGATIVE
Glucose, UA: NEGATIVE mg/dL
Hgb urine dipstick: NEGATIVE
Ketones, ur: 15 mg/dL — AB
Leukocytes, UA: NEGATIVE
Nitrite: NEGATIVE
Protein, ur: NEGATIVE mg/dL
Specific Gravity, Urine: 1.03 (ref 1.005–1.030)
pH: 6 (ref 5.0–8.0)

## 2015-07-17 LAB — LIPASE, BLOOD: Lipase: 28 U/L (ref 11–51)

## 2015-07-17 MED ORDER — ONDANSETRON 8 MG PO TBDP
ORAL_TABLET | ORAL | Status: AC
  Filled 2015-07-17: qty 1

## 2015-07-17 MED ORDER — ONDANSETRON 8 MG PO TBDP
8.0000 mg | ORAL_TABLET | Freq: Once | ORAL | Status: AC
  Administered 2015-07-17: 8 mg via ORAL

## 2015-07-17 NOTE — ED Notes (Signed)
Pt no in the waiting room, second call

## 2015-07-17 NOTE — ED Notes (Signed)
PT c/o fever, n/v/d that stated this am. PT denies any urinary symptoms. PT stated vomited x7 and diarrhea x4.

## 2015-08-26 ENCOUNTER — Emergency Department (HOSPITAL_COMMUNITY): Payer: Self-pay

## 2015-08-26 ENCOUNTER — Emergency Department (HOSPITAL_COMMUNITY)
Admission: EM | Admit: 2015-08-26 | Discharge: 2015-08-26 | Disposition: A | Discharge status: Home / Self Care | Payer: Self-pay | Attending: Emergency Medicine | Admitting: Emergency Medicine

## 2015-08-26 ENCOUNTER — Encounter (HOSPITAL_COMMUNITY): Payer: Self-pay | Admitting: Emergency Medicine

## 2015-08-26 DIAGNOSIS — R0789 Other chest pain: Secondary | ICD-10-CM | POA: Insufficient documentation

## 2015-08-26 DIAGNOSIS — J45909 Unspecified asthma, uncomplicated: Secondary | ICD-10-CM | POA: Insufficient documentation

## 2015-08-26 DIAGNOSIS — I10 Essential (primary) hypertension: Secondary | ICD-10-CM | POA: Insufficient documentation

## 2015-08-26 DIAGNOSIS — R200 Anesthesia of skin: Secondary | ICD-10-CM | POA: Insufficient documentation

## 2015-08-26 DIAGNOSIS — F17210 Nicotine dependence, cigarettes, uncomplicated: Secondary | ICD-10-CM | POA: Insufficient documentation

## 2015-08-26 DIAGNOSIS — Z79899 Other long term (current) drug therapy: Secondary | ICD-10-CM | POA: Insufficient documentation

## 2015-08-26 HISTORY — DX: Cervicalgia: M54.2

## 2015-08-26 HISTORY — DX: Gastro-esophageal reflux disease without esophagitis: K21.9

## 2015-08-26 HISTORY — DX: Other chronic pain: G89.29

## 2015-08-26 LAB — CBC
HCT: 44.3 % (ref 39.0–52.0)
Hemoglobin: 15.3 g/dL (ref 13.0–17.0)
MCH: 29.8 pg (ref 26.0–34.0)
MCHC: 34.5 g/dL (ref 30.0–36.0)
MCV: 86.2 fL (ref 78.0–100.0)
Platelets: 270 10*3/uL (ref 150–400)
RBC: 5.14 MIL/uL (ref 4.22–5.81)
RDW: 12.9 % (ref 11.5–15.5)
WBC: 11.8 10*3/uL — ABNORMAL HIGH (ref 4.0–10.5)

## 2015-08-26 LAB — COMPREHENSIVE METABOLIC PANEL
ALT: 18 U/L (ref 17–63)
AST: 28 U/L (ref 15–41)
Albumin: 4.6 g/dL (ref 3.5–5.0)
Alkaline Phosphatase: 76 U/L (ref 38–126)
Anion gap: 8 (ref 5–15)
BUN: 19 mg/dL (ref 6–20)
CO2: 25 mmol/L (ref 22–32)
Calcium: 9.1 mg/dL (ref 8.9–10.3)
Chloride: 105 mmol/L (ref 101–111)
Creatinine, Ser: 1.13 mg/dL (ref 0.61–1.24)
GFR calc Af Amer: >60 mL/min (ref >60)
GFR calc non Af Amer: >60 mL/min (ref >60)
Glucose, Bld: 80 mg/dL (ref 65–99)
Potassium: 4 mmol/L (ref 3.5–5.1)
Sodium: 138 mmol/L (ref 135–145)
Total Bilirubin: 0.5 mg/dL (ref 0.3–1.2)
Total Protein: 7.3 g/dL (ref 6.5–8.1)

## 2015-08-26 LAB — TROPONIN I: Troponin I: <0.03 ng/mL (ref <0.031)

## 2015-08-26 MED ORDER — IOPAMIDOL (ISOVUE-370) INJECTION 76%
100.0000 mL | Freq: Once | INTRAVENOUS | Status: AC | PRN
  Administered 2015-08-26: 100 mL via INTRAVENOUS

## 2015-08-26 MED ORDER — IBUPROFEN 600 MG PO TABS: 600.0000 mg | ORAL_TABLET | Freq: Four times a day (QID) | ORAL | Status: DC | PRN

## 2015-08-26 MED ORDER — HYDROCODONE-ACETAMINOPHEN 5-325 MG PO TABS: 1.0000 | ORAL_TABLET | ORAL | Status: DC | PRN

## 2015-08-26 MED ORDER — LORAZEPAM 2 MG/ML IJ SOLN
1.0000 mg | Freq: Once | INTRAMUSCULAR | Status: AC
  Administered 2015-08-26: 1 mg via INTRAVENOUS
  Filled 2015-08-26: qty 1

## 2015-08-26 MED ORDER — MORPHINE SULFATE (PF) 4 MG/ML IV SOLN
4.0000 mg | Freq: Once | INTRAVENOUS | Status: AC
  Administered 2015-08-26: 4 mg via INTRAVENOUS
  Filled 2015-08-26: qty 1

## 2015-08-26 MED ORDER — KETOROLAC TROMETHAMINE 30 MG/ML IJ SOLN
30.0000 mg | Freq: Once | INTRAMUSCULAR | Status: AC
  Administered 2015-08-26: 30 mg via INTRAVENOUS
  Filled 2015-08-26: qty 1

## 2015-08-26 NOTE — ED Provider Notes (Signed)
CSN: [id number]     Arrival date & time 08/26/15  1939 History   First MD Initiated Contact with Patient 08/26/15 2014     Chief Complaint  Patient presents with  . Chest Pain  PT C/O LEFT SIDED CP THAT STARTED TODAY AROUND 1500.  THE PT SAID THE PAIN RADIATES TO HIS LEFT ARM AND HE HAS NUMBNESS TO THE LEFT SIDE OF HIS FACE.  THE PT HAS NEVER HAD ANYTHING LIKE THIS IN THE PAST.   (Consider location/radiation/quality/duration/timing/severity/associated sxs/prior Treatment) Patient is a 30 y.o. male presenting with chest pain. The history is provided by the patient.  Chest Pain Pain location:  L chest Pain quality: pressure   Pain radiates to:  L jaw, L shoulder and L arm Pain radiates to the back: no   Pain severity:  Moderate Onset quality:  Sudden Timing:  Constant Chronicity:  New Associated symptoms: numbness     Past Medical History  Diagnosis Date  . Hypertension   . Chronic back pain   . Asthma   . GERD (gastroesophageal reflux disease)   . Chronic neck pain    Past Surgical History  Procedure Laterality Date  . Hernia repair    . Thumb surgery     Family History  Problem Relation Age of Onset  . Hypertension Mother   . Seizures Mother   . Cancer Brother    Social History  Substance Use Topics  . Smoking status: Current Every Day Smoker -- 1.00 packs/day    Types: Cigarettes  . Smokeless tobacco: Current User    Types: Snuff  . Alcohol Use: Yes     Comment: occ    Review of Systems  Cardiovascular: Positive for chest pain.  Neurological: Positive for numbness.  All other systems reviewed and are negative.     Allergies  Peanuts; Penicillins; Shellfish allergy; and Flexeril  Home Medications   Prior to Admission medications   Medication Sig Start Date End Date Taking? Authorizing Provider  albuterol (PROVENTIL HFA;VENTOLIN HFA) 108 (90 BASE) MCG/ACT inhaler Inhale 1-2 puffs into the lungs every 6 (six) hours as needed for wheezing or shortness  of breath. 12/04/14  Yes Donnetta Hutching, MD  propranolol (INDERAL) 80 MG tablet Take 1 tablet (80 mg total) by mouth daily. 05/07/15  Yes Tammy Triplett, PA-C  HYDROcodone-acetaminophen (NORCO/VICODIN) 5-325 MG tablet Take 1 tablet by mouth every 4 (four) hours as needed. 08/26/15   Jacalyn Lefevre, MD  ibuprofen (ADVIL,MOTRIN) 600 MG tablet Take 1 tablet (600 mg total) by mouth every 6 (six) hours as needed. 08/26/15   Jacalyn Lefevre, MD   BP 108/63 mmHg  Pulse 67  Temp(Src) 97.9 F (36.6 C) (Oral)  Resp 18  Ht  (1.88 m)  Wt 205 lb (92.987 kg)  BMI 26.31 kg/m2  SpO2 96% Physical Exam  Constitutional: He is oriented to person, place, and time. He appears well-developed and well-nourished.  HENT:  Head: Normocephalic and atraumatic.  Right Ear: External ear normal.  Left Ear: External ear normal.  Nose: Nose normal.  Mouth/Throat: Oropharynx is clear and moist.  Eyes: Conjunctivae and EOM are normal. Pupils are equal, round, and reactive to light.  Neck: Normal range of motion. Neck supple.  Cardiovascular: Normal rate, regular rhythm, normal heart sounds and intact distal pulses.   Pulmonary/Chest: Effort normal and breath sounds normal.  Abdominal: Soft. Bowel sounds are normal.  Musculoskeletal: Normal range of motion.  Neurological: He is alert and oriented to person, place, and time.  Skin: Skin is warm and dry.  Psychiatric: He has a normal mood and affect. His behavior is normal. Judgment and thought content normal.  Nursing note and vitals reviewed.   ED Course  Procedures (including critical care time) Labs Review Labs Reviewed  CBC - Abnormal; Notable for the following:    WBC 11.8 (*)    All other components within normal limits  TROPONIN I  COMPREHENSIVE METABOLIC PANEL    Imaging Review Dg Chest 2 View  08/26/2015  CLINICAL DATA:  Left chest pain and left upper extremity pain beginning at noon today. Initial encounter. EXAM: CHEST  2 VIEW COMPARISON:  PA and  lateral chest 12/03/2010 and 03/30/2015. FINDINGS: Lungs are clear. Heart size is normal. No pneumothorax or pleural effusion. No focal bony abnormality. IMPRESSION: Negative chest. Electronically Signed   By: Drusilla Kannerhomas  Dalessio M.D.   On: 08/26/2015 20:17   Ct Head Wo Contrast  08/26/2015  CLINICAL DATA:  30 year old male with left arm numbness EXAM: CT HEAD WITHOUT CONTRAST TECHNIQUE: Contiguous axial images were obtained from the base of the skull through the vertex without intravenous contrast. COMPARISON:  CT dated 05/07/2015 FINDINGS: The ventricles and the sulci are appropriate in size for the patient's age. There is no intracranial hemorrhage. No midline shift or mass effect identified. The gray-white matter differentiation is preserved. The visualized paranasal sinuses and mastoid air cells are well aerated. The calvarium is intact. IMPRESSION: No acute intracranial pathology. Electronically Signed   By: Elgie CollardArash  Radparvar M.D.   On: 08/26/2015 21:06   Ct Angio Chest Pe W/cm &/or Wo Cm  08/26/2015  CLINICAL DATA:  Chest pain and shortness of breath. EXAM: CT ANGIOGRAPHY CHEST WITH CONTRAST TECHNIQUE: Multidetector CT imaging of the chest was performed using the standard protocol during bolus administration of intravenous contrast. Multiplanar CT image reconstructions and MIPs were obtained to evaluate the vascular anatomy. CONTRAST:  100 mL Isovue 370 COMPARISON:  12/03/2010 FINDINGS: Mediastinum/Lymph Nodes: No pulmonary emboli or thoracic aortic dissection identified. No evidence of thoracic aortic aneurysm. Normal heart size. No masses or pathologically enlarged lymph nodes identified. Lungs/Pleura: No pulmonary mass, infiltrate, or effusion. Several adjacent subpleural nodules in the left lower lobe remain stable, largest measuring 4 mm on image 64. No new or enlarging pulmonary nodules identified. Upper abdomen: No acute findings. Musculoskeletal: No chest wall mass or suspicious bone lesions  identified. Review of the MIP images confirms the above findings. IMPRESSION: No evidence of pulmonary embolism or other active disease within the thorax. Stable left lower lobe pulmonary nodules measuring up to 4 mm, consistent with benign etiology. Electronically Signed   By: Myles RosenthalJohn  Stahl M.D.   On: 08/26/2015 21:51   I have personally reviewed and evaluated these images and lab results as part of my medical decision-making.   EKG Interpretation   Date/Time:  Wednesday Aug 26 2015 19:56:52 EDT Ventricular Rate:  81 PR Interval:  144 QRS Duration: 80 QT Interval:  346 QTC Calculation: 401 R Axis:   56 Text Interpretation:  Normal sinus rhythm Nonspecific ST abnormality  Abnormal ECG Confirmed by Particia NearingHAVILAND MD, Yoskar Murrillo ([id number]) on 08/26/2015 9:07:21  PM      MDM   Final diagnoses:  Atypical chest pain        Jacalyn LefevreJulie Jarrick Fjeld, MD 08/27/15 0015

## 2015-08-26 NOTE — Discharge Instructions (Signed)
Chest Wall Pain °Chest wall pain is pain in or around the bones and muscles of your chest. Sometimes, an injury causes this pain. Sometimes, the cause may not be known. This pain may take several weeks or longer to get better. °HOME CARE °Pay attention to any changes in your symptoms. Take these actions to help with your pain: °· Rest as told by your doctor. °· Avoid activities that cause pain. Try not to use your chest, belly (abdominal), or side muscles to lift heavy things. °· If directed, apply ice to the painful area: °¨ Put ice in a plastic bag. °¨ Place a towel between your skin and the bag. °¨ Leave the ice on for 20 minutes, 2-3 times per day. °· Take over-the-counter and prescription medicines only as told by your doctor. °· Do not use tobacco products, including cigarettes, chewing tobacco, and e-cigarettes. If you need help quitting, ask your doctor. °· Keep all follow-up visits as told by your doctor. This is important. °GET HELP IF: °· You have a fever. °· Your chest pain gets worse. °· You have new symptoms. °GET HELP RIGHT AWAY IF: °· You feel sick to your stomach (nauseous) or you throw up (vomit). °· You feel sweaty or light-headed. °· You have a cough with phlegm (sputum) or you cough up blood. °· You are short of breath. °  °This information is not intended to replace advice given to you by your health care provider. Make sure you discuss any questions you have with your health care provider. °  °Document Released: 09/21/2007 Document Revised: 12/24/2014 Document Reviewed: 06/30/2014 °Elsevier Interactive Patient Education ©2016 [employer] Inc[employer] ° °

## 2015-08-26 NOTE — ED Notes (Signed)
On set today around 3pm chest pain radiating into left arm, numbness to left side of face

## 2015-09-29 ENCOUNTER — Emergency Department (HOSPITAL_COMMUNITY): Payer: Self-pay

## 2015-09-29 ENCOUNTER — Encounter (HOSPITAL_COMMUNITY): Payer: Self-pay | Admitting: Emergency Medicine

## 2015-09-29 ENCOUNTER — Emergency Department (HOSPITAL_COMMUNITY)
Admission: EM | Admit: 2015-09-29 | Discharge: 2015-09-29 | Disposition: A | Discharge status: Home / Self Care | Payer: Self-pay | Attending: Emergency Medicine | Admitting: Emergency Medicine

## 2015-09-29 ENCOUNTER — Telehealth: Payer: Self-pay | Admitting: Orthopedic Surgery

## 2015-09-29 DIAGNOSIS — S4991XA Unspecified injury of right shoulder and upper arm, initial encounter: Secondary | ICD-10-CM

## 2015-09-29 DIAGNOSIS — J45909 Unspecified asthma, uncomplicated: Secondary | ICD-10-CM | POA: Insufficient documentation

## 2015-09-29 DIAGNOSIS — I10 Essential (primary) hypertension: Secondary | ICD-10-CM | POA: Insufficient documentation

## 2015-09-29 DIAGNOSIS — Y9389 Activity, other specified: Secondary | ICD-10-CM | POA: Insufficient documentation

## 2015-09-29 DIAGNOSIS — F17210 Nicotine dependence, cigarettes, uncomplicated: Secondary | ICD-10-CM | POA: Insufficient documentation

## 2015-09-29 DIAGNOSIS — Y929 Unspecified place or not applicable: Secondary | ICD-10-CM | POA: Insufficient documentation

## 2015-09-29 DIAGNOSIS — S40011A Contusion of right shoulder, initial encounter: Secondary | ICD-10-CM | POA: Insufficient documentation

## 2015-09-29 DIAGNOSIS — Z79899 Other long term (current) drug therapy: Secondary | ICD-10-CM | POA: Insufficient documentation

## 2015-09-29 DIAGNOSIS — X58XXXA Exposure to other specified factors, initial encounter: Secondary | ICD-10-CM | POA: Insufficient documentation

## 2015-09-29 DIAGNOSIS — Y999 Unspecified external cause status: Secondary | ICD-10-CM | POA: Insufficient documentation

## 2015-09-29 DIAGNOSIS — Z791 Long term (current) use of non-steroidal anti-inflammatories (NSAID): Secondary | ICD-10-CM | POA: Insufficient documentation

## 2015-09-29 MED ORDER — NAPROXEN 500 MG PO TABS: 500.0000 mg | ORAL_TABLET | Freq: Two times a day (BID) | ORAL | Status: DC

## 2015-09-29 NOTE — Discharge Instructions (Signed)
Take naprosyn for pain and inflammation. Ice several times a day. Rest. Sling for comfort as needed. No lifting more than 1 lb for at least a week. Follow up with orthopedics as referred for further evaluation and treatment.   Shoulder Sprain A shoulder sprain is a partial or complete tear in one of the tough, fiber-like tissues (ligaments) in the shoulder. The ligaments in the shoulder help to hold the shoulder in place. CAUSES This condition may be caused by:  A fall.  A hit to the shoulder.  A twist of the arm. RISK FACTORS This condition is more likely to develop in:  People who play sports.  People who have problems with balance or coordination. SYMPTOMS Symptoms of this condition include:  Pain when moving the shoulder.  Limited ability to move the shoulder.  Swelling and tenderness on top of the shoulder.  Warmth in the shoulder.  A change in the shape of the shoulder.  Redness or bruising on the shoulder. DIAGNOSIS This condition is diagnosed with a physical exam. During the exam, you may be asked to do simple exercises with your shoulder. You may also have imaging tests, such as X-rays, MRI, or a CT scan. These tests can show how severe the sprain is. TREATMENT This condition may be treated with:  Rest.  Pain medicine.  Ice.  A sling or brace. This is used to keep the arm still while the shoulder is healing.  Physical therapy or rehabilitation exercises. These help to improve the range of motion and strength of the shoulder.  Surgery (rare). Surgery may be needed if the sprain caused a joint to become unstable. Surgery may also be needed to reduce pain. Some people may develop ongoing shoulder pain or lose some range of motion in the shoulder. However, most people do not develop long-term problems. HOME CARE INSTRUCTIONS  Rest.  Take over-the-counter and prescription medicines only as told by your health care provider.  If directed, apply ice to the  area:  Put ice in a plastic bag.  Place a towel between your skin and the bag.  Leave the ice on for 20 minutes, 2-3 times per day.  If you were given a shoulder sling or brace:  Wear it as told.  Remove it to shower or bathe.  Move your arm only as much as told by your health care provider, but keep your hand moving to prevent swelling.  If you were shown how to do any exercises, do them as told by your health care provider.  Keep all follow-up visits as told by your health care provider. This is important. SEEK MEDICAL CARE IF:  Your pain gets worse.  Your pain is not relieved with medicines.  You have increased redness or swelling. SEEK IMMEDIATE MEDICAL CARE IF:  You have a fever.  You cannot move your arm or shoulder.  You develop numbness or tingling in your arms, hands, or fingers.   This information is not intended to replace advice given to you by your health care provider. Make sure you discuss any questions you have with your health care provider.   Document Released: 08/21/2008 Document Revised: 12/24/2014 Document Reviewed: 07/28/2014 Elsevier Interactive Patient Education [employer]2016 [employer] Inc.

## 2015-09-29 NOTE — Telephone Encounter (Signed)
Called patient; reached; appointment scheduled with Dr Hilda LiasKeeling tomorrow, 09/30/15, 8:40; aware.

## 2015-09-29 NOTE — ED Notes (Signed)
Patient given discharge instruction, verbalized understand. Patient ambulatory out of the department.  

## 2015-09-29 NOTE — ED Provider Notes (Signed)
CSN: [id number]     Arrival date & time 09/29/15  7829 History   First MD Initiated Contact with Patient 09/29/15 [phone]     Chief Complaint  Patient presents with  . Shoulder Injury     (Consider location/radiation/quality/duration/timing/severity/associated sxs/prior Treatment) HPI Eric Lowery is a 30 y.o. male with hx of chronic neck and back pain, presents to ED with complaint of right shoulder injury. Pt was carrying a cast iron tub on his shoulder. States he lost balance and it started falling backwards. Pt tried to catch it but was unable to and it pulled his arm back at the shoulder joint. Pt reports pain to the anterior and posterior shoulder. Denies any numbness or tingling to the hand. No other injuries. Did not feel a "pop." States since the injury, which occurred today, he is unable to lift anything heavy. No prior shoulder injuries.   Past Medical History  Diagnosis Date  . Hypertension   . Chronic back pain   . Asthma   . GERD (gastroesophageal reflux disease)   . Chronic neck pain    Past Surgical History  Procedure Laterality Date  . Hernia repair    . Thumb surgery     Family History  Problem Relation Age of Onset  . Hypertension Mother   . Seizures Mother   . Cancer Brother    Social History  Substance Use Topics  . Smoking status: Current Every Day Smoker -- 1.00 packs/day    Types: Cigarettes  . Smokeless tobacco: Current User    Types: Snuff  . Alcohol Use: Yes     Comment: occ    Review of Systems  Constitutional: Negative for fever and chills.  Musculoskeletal: Positive for arthralgias.  Neurological: Negative for weakness and numbness.      Allergies  Peanuts; Penicillins; Shellfish allergy; and Flexeril  Home Medications   Prior to Admission medications   Medication Sig Start Date End Date Taking? Authorizing Provider  albuterol (PROVENTIL HFA;VENTOLIN HFA) 108 (90 BASE) MCG/ACT inhaler Inhale 1-2 puffs into the lungs every 6 (six)  hours as needed for wheezing or shortness of breath. 12/04/14  Yes Donnetta Hutching, MD  ibuprofen (ADVIL,MOTRIN) 600 MG tablet Take 1 tablet (600 mg total) by mouth every 6 (six) hours as needed. Patient taking differently: Take 600 mg by mouth every 6 (six) hours as needed for moderate pain.  08/26/15  Yes Jacalyn Lefevre, MD  propranolol (INDERAL) 80 MG tablet Take 1 tablet (80 mg total) by mouth daily. 05/07/15  Yes Tammy Triplett, PA-C  HYDROcodone-acetaminophen (NORCO/VICODIN) 5-325 MG tablet Take 1 tablet by mouth every 4 (four) hours as needed. Patient not taking: Reported on 09/29/2015 08/26/15   Jacalyn Lefevre, MD   BP 137/107 mmHg  Pulse 78  Temp(Src) 97.9 F (36.6 C) (Oral)  Resp 18  Ht  (1.88 m)  Wt 93.895 kg  BMI 26.57 kg/m2  SpO2 100% Physical Exam  Constitutional: He appears well-developed and well-nourished. No distress.  HENT:  Head: Normocephalic.  Neck: Normal range of motion. Neck supple.  Cardiovascular: Normal rate, regular rhythm and normal heart sounds.   Pulmonary/Chest: Effort normal and breath sounds normal. No respiratory distress. He has no wheezes. He has no rales.  Abdominal: Soft. There is no tenderness.  Musculoskeletal:  Diffuse tenderness over right shoulder both anterior and posterior joints. No obvious swelling or deformity. There is a small contusion present to the anterior right shoulder, however it appears to be old, it is  yellowish in discoloration. Pain with any range of motion at the shoulder joint. Patient also has tenderness of the biceps muscle, however biceps strength is intact at this time. Distal radial pulse intact.  Skin: Skin is warm and dry.  Nursing note and vitals reviewed.   ED Course  Procedures (including critical care time) Labs Review Labs Reviewed - No data to display  Imaging Review Dg Shoulder Right  09/29/2015  CLINICAL DATA:  Right shoulder pain starting this morning carrying a cast iron tube EXAM: RIGHT SHOULDER - 2+  VIEW COMPARISON:  None. FINDINGS: Three views of the right shoulder submitted. No acute fracture or subluxation. AC joint and glenohumeral joint are preserved. No radiopaque foreign body. IMPRESSION: Negative. Electronically Signed   By: Natasha MeadLiviu  Pop M.D.   On: 09/29/2015 10:02   I have personally reviewed and evaluated these images and lab results as part of my medical decision-making.   EKG Interpretation None      MDM   Final diagnoses:  Shoulder injury, right, initial encounter   Patient with a right shoulder injury. X-rays negative. I suspect a sprain versus muscular tear. I will place him in a sling. Ice and rest. Will have him follow up with orthopedic specialist. NSAIDs for pain and inflammation.  Filed Vitals:   09/29/15 0933  BP: 137/107  Pulse: 78  Temp: 97.9 F (36.6 C)  TempSrc: Oral  Resp: 18  Height: 6\' 2"  (1.88 m)  Weight: 93.895 kg  SpO2: 100%     Jaynie Crumbleatyana Brittania Sudbeck, PA-C 09/29/15 1211  Blane OharaJoshua Zavitz, MD 09/29/15 1555

## 2015-09-29 NOTE — ED Notes (Signed)
Pt reports injuring RT shoulder this morning while carrying a cast iron tub. Reports limited ROM due to pain. No obvious deformity noted. Radial pulse strong, cap refill brisk.

## 2015-09-29 NOTE — Telephone Encounter (Signed)
Patient called to discuss scheduling a follow up appointment following [facility] today, 09/29/15.  Relays concern about "possible tear" right shoulder.  Offered first available appointment tomorrow, after speaking with Dr Romeo AppleHarrison.  Called back to patient to schedule appointment; no voice mail set up on phone# [phone] (continuing to try calling back)

## 2015-09-29 NOTE — ED Notes (Signed)
PA at the bedside.

## 2015-09-30 ENCOUNTER — Encounter: Payer: Self-pay | Admitting: Orthopaedic Surgery

## 2015-09-30 ENCOUNTER — Ambulatory Visit (INDEPENDENT_AMBULATORY_CARE_PROVIDER_SITE_OTHER): Payer: Self-pay | Admitting: Orthopaedic Surgery

## 2015-09-30 ENCOUNTER — Encounter: Payer: Self-pay | Admitting: Orthopedic Surgery

## 2015-09-30 VITALS — BP 129/86 | HR 73 | Temp 98.1°F | Ht 72.0 in | Wt 190.2 lb

## 2015-09-30 DIAGNOSIS — M25511 Pain in right shoulder: Secondary | ICD-10-CM

## 2015-09-30 MED ORDER — OXYCODONE-ACETAMINOPHEN 5-325 MG PO TABS: 1.0000 | ORAL_TABLET | ORAL | Status: DC | PRN

## 2015-09-30 NOTE — Progress Notes (Signed)
Subjective: I hurt my right shoulder yesterday.  It hurts bad    Patient ID: Eric Lowery, male    DOB: [birth year], 30 y.o.   MRN: [id number]  HPI He was lifting a very heavy cast iron tub at his home alone.  He is doing remodeling work.  He was trying to move it up to the bathroom by himself.  He lost control of it, he grabbed it firmly to hold it and felt a "pop" in the right shoulder.  He has had severe pain in the shoulder since then.  He went directly to the ER.  X-rays were done and were negative.  He has been given Naprosyn which has not helped his pain.  He cannot use his arm hardly at all.  He has biceps tendon pain as well.  He has used ice which helped.  He has no paresthesias, no other injury.  I have reviewed the ER report, the x-rays and the x-ray report.   Review of Systems  HENT: Negative for congestion.   Respiratory: Positive for shortness of breath. Negative for cough.   Cardiovascular: Negative for chest pain and leg swelling.  Endocrine: Negative for cold intolerance.  Musculoskeletal: Positive for myalgias, back pain and arthralgias.  Allergic/Immunologic: Negative for environmental allergies.   Past Medical History  Diagnosis Date  . Hypertension   . Chronic back pain   . Asthma   . GERD (gastroesophageal reflux disease)   . Chronic neck pain     Past Surgical History  Procedure Laterality Date  . Hernia repair    . Thumb surgery      Current Outpatient Prescriptions on File Prior to Visit  Medication Sig Dispense Refill  . albuterol (PROVENTIL HFA;VENTOLIN HFA) 108 (90 BASE) MCG/ACT inhaler Inhale 1-2 puffs into the lungs every 6 (six) hours as needed for wheezing or shortness of breath. 1 Inhaler 1  . propranolol (INDERAL) 80 MG tablet Take 1 tablet (80 mg total) by mouth daily. 15 tablet 0  . HYDROcodone-acetaminophen (NORCO/VICODIN) 5-325 MG tablet Take 1 tablet by mouth every 4 (four) hours as needed. (Patient not taking: Reported on 09/29/2015) 10  tablet 0  . ibuprofen (ADVIL,MOTRIN) 600 MG tablet Take 1 tablet (600 mg total) by mouth every 6 (six) hours as needed. (Patient not taking: Reported on 09/30/2015) 30 tablet 0  . naproxen (NAPROSYN) 500 MG tablet Take 1 tablet (500 mg total) by mouth 2 (two) times daily. (Patient not taking: Reported on 09/30/2015) 30 tablet 0  . [DISCONTINUED] dicyclomine (BENTYL) 20 MG tablet Take 1 tablet (20 mg total) by mouth 2 (two) times daily. (Patient not taking: Reported on 02/10/2015) 20 tablet 0  . [DISCONTINUED] omeprazole (PRILOSEC) 20 MG capsule Take 1 capsule (20 mg total) by mouth 2 (two) times daily. (Patient not taking: Reported on 04/22/2015) 60 capsule 0   No current facility-administered medications on file prior to visit.    Social History   Social History  . Marital Status: Single    Spouse Name: N/A  . Number of Children: N/A  . Years of Education: N/A   Occupational History  . Not on file.   Social History Main Topics  . Smoking status: Current Every Day Smoker -- 1.00 packs/day    Types: Cigarettes  . Smokeless tobacco: Current User    Types: Snuff  . Alcohol Use: Yes     Comment: occ  . Drug Use: No  . Sexual Activity: Yes   Other Topics Concern  .  Not on file   Social History Narrative    BP 129/86 mmHg  Pulse 73  Temp(Src) 98.1 F (36.7 C)  Ht 6' (1.829 m)  Wt 190 lb 3.2 oz (86.274 kg)  BMI 25.79 kg/m2     Objective:   Physical Exam  Constitutional: He is oriented to person, place, and time. He appears well-developed and well-nourished.  HENT:  Head: Normocephalic and atraumatic.  Eyes: Conjunctivae and EOM are normal. Pupils are equal, round, and reactive to light.  Neck: Normal range of motion. Neck supple.  Cardiovascular: Normal rate, regular rhythm and intact distal pulses.   Pulmonary/Chest: Effort normal.  Abdominal: Soft.  Musculoskeletal: He exhibits tenderness (His right shoulder is very painful, there is no redness or ecchymosis.  He has  very limited motion at all.  He is tender over the long head of biceps on right with no defect noted.  Pain level is high.  Neck, left shouldder negative.).  Neurological: He is alert and oriented to person, place, and time. He has normal reflexes. No cranial nerve deficit. He exhibits normal muscle tone. Coordination normal.  Skin: Skin is warm and dry.  Psychiatric: He has a normal mood and affect. His behavior is normal. Judgment and thought content normal.          Assessment & Plan:  I am concerned of biceps long head rupture or partial rupture as well as rotator cuff tear.  I would like to get a MRI of the right shoulder.  He will see about getting [facility].  I have given pain medicine.  I have told him to continue sling and ice.  He is to sleep semi-erect.  Call if any problem.  Precautions discussed.  I will see him after the MRI.  Electronically Signed Darreld Mclean, MD 6/14/20171:39 PM

## 2015-09-30 NOTE — Patient Instructions (Addendum)
[facility] for an [facility].  He will let us know when this has been approved.  Precautions discussed. Continue the Naproxyn he was given in the ED.  Out of work until next apt.

## 2015-10-07 ENCOUNTER — Ambulatory Visit: Payer: Self-pay | Admitting: Orthopaedic Surgery

## 2015-10-21 ENCOUNTER — Telehealth: Payer: Self-pay | Admitting: Orthopaedic Surgery

## 2015-10-21 ENCOUNTER — Other Ambulatory Visit: Payer: Self-pay | Admitting: Radiology

## 2015-10-21 DIAGNOSIS — M25511 Pain in right shoulder: Secondary | ICD-10-CM

## 2015-10-21 MED ORDER — OXYCODONE-ACETAMINOPHEN 5-325 MG PO TABS: 1.0000 | ORAL_TABLET | ORAL | Status: DC | PRN

## 2015-10-21 NOTE — Telephone Encounter (Signed)
Patient called and requested a refill on Oxycodone-Acetaminophen 5-325 mgs.  Qty  60    Sig: Take 1 tablet by mouth every 4 (four) hours as needed for moderate pain or severe pain (Must last 15 days.Do not drive or operate machinery while taking this medicine).     ADDENDUM:  He also stated that he now has the money to do the MRI.  [facility].

## 2015-10-21 NOTE — Telephone Encounter (Signed)
Rx done. 

## 2015-10-21 NOTE — Telephone Encounter (Signed)
Patient called and requested that you set up MRI for his right shoulder.  He now has the money to get this done.  Please call him with appointment for MRI and a follow up appointment with Dr. Hilda LiasKeeling.  Thanks

## 2015-10-22 ENCOUNTER — Other Ambulatory Visit: Payer: Self-pay | Admitting: Radiology

## 2015-10-22 DIAGNOSIS — M25511 Pain in right shoulder: Secondary | ICD-10-CM

## 2015-10-22 NOTE — Telephone Encounter (Signed)
I took care of this. And called the patient.

## 2015-10-27 ENCOUNTER — Ambulatory Visit: Payer: Self-pay | Admitting: Orthopaedic Surgery

## 2015-11-11 ENCOUNTER — Ambulatory Visit: Payer: Self-pay | Admitting: Orthopaedic Surgery

## 2016-06-07 IMAGING — CT CT ABD-PELV W/ CM
2 of 4 series · 15 of 46 positions shown, 17 images · IV contrast (omnipaque)
Comparison: 12/30/2014 CT abdomen/pelvis .

CLINICAL DATA: Left lower abdominal pain and bloody diarrhea.

EXAM:
CT ABDOMEN AND PELVIS WITH CONTRAST
TECHNIQUE: Multidetector CT imaging of the abdomen and pelvis was performed
using the standard protocol following bolus administration of
intravenous contrast.
CONTRAST:  25mL OMNIPAQUE IOHEXOL 300 MG/ML SOLN, 100mL OMNIPAQUE
IOHEXOL 300 MG/ML SOLN

[Series 2: abd_pel_with 5.0 b40f · axial · 0.71mm/px · z∈[-524,-114]mm · 12 of 94 slices shown, 14 images]
[im 8/94  soft-tissue]
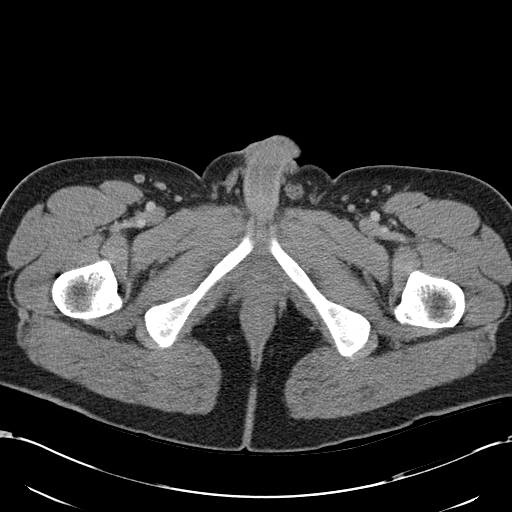
[im 8/94  bone]
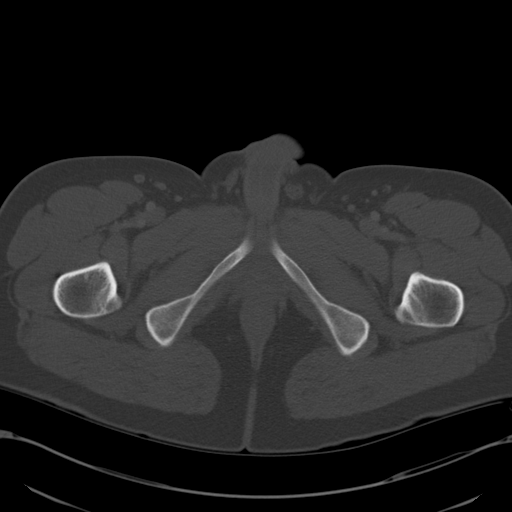
[im 15/94  soft-tissue]
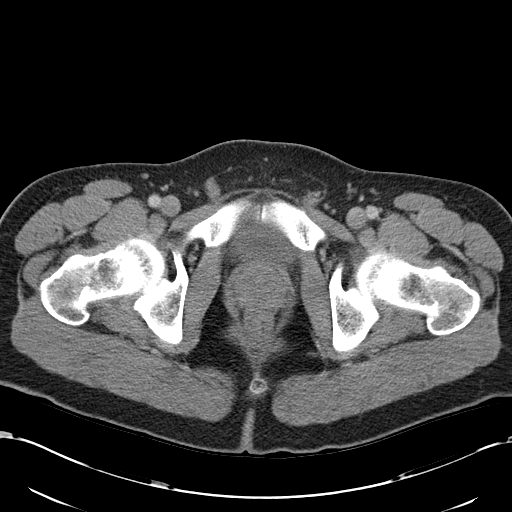
[im 23/94  soft-tissue]
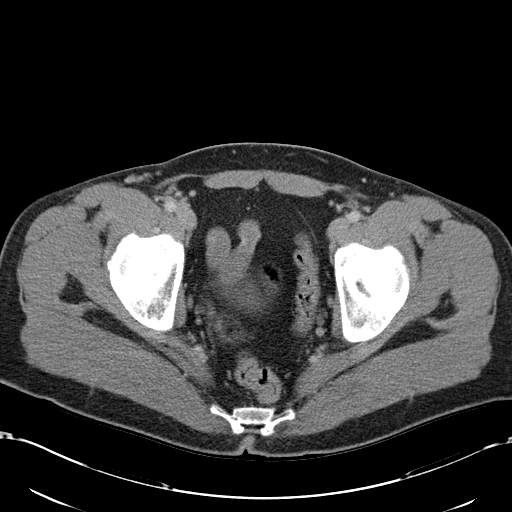
[im 30/94  soft-tissue]
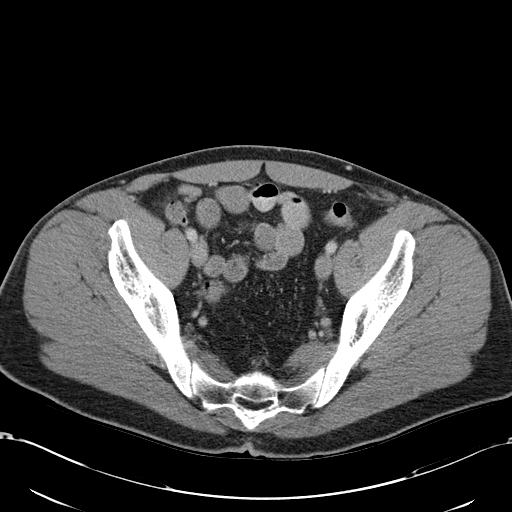
[im 38/94  soft-tissue]
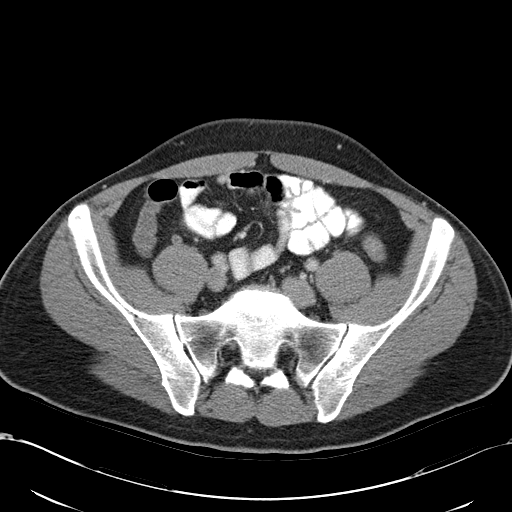
[im 45/94  soft-tissue]
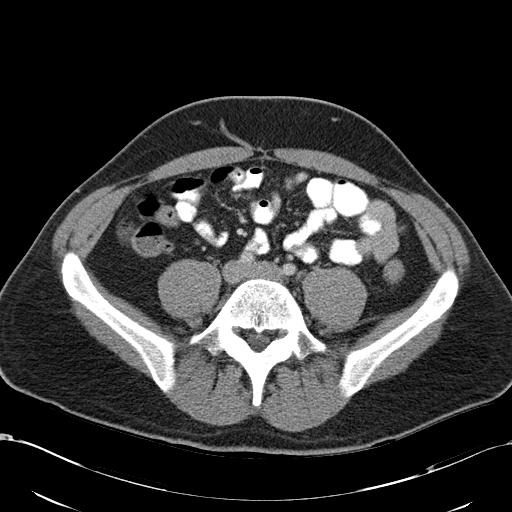
[im 53/94  soft-tissue]
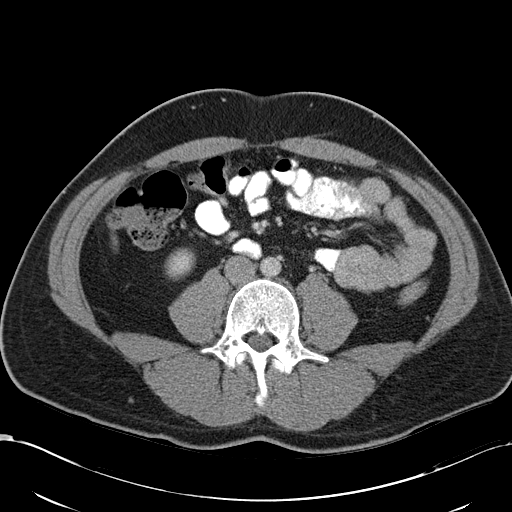
[im 60/94  soft-tissue]
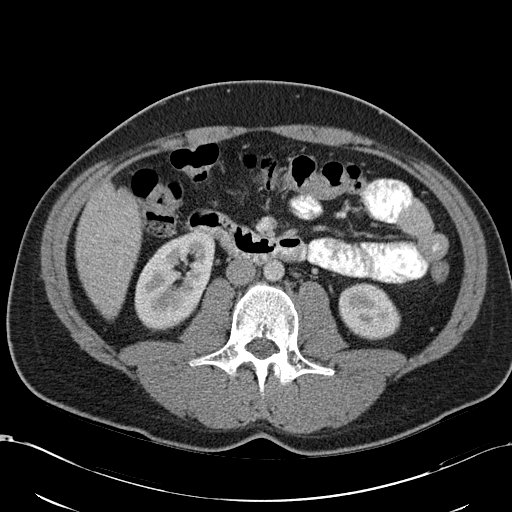
[im 67/94  soft-tissue]
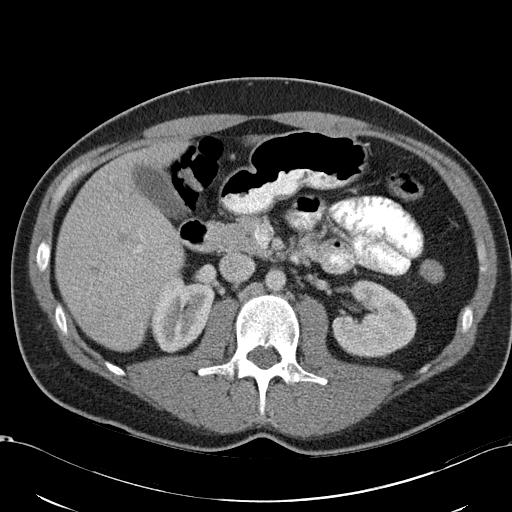
[im 67/94  bone]
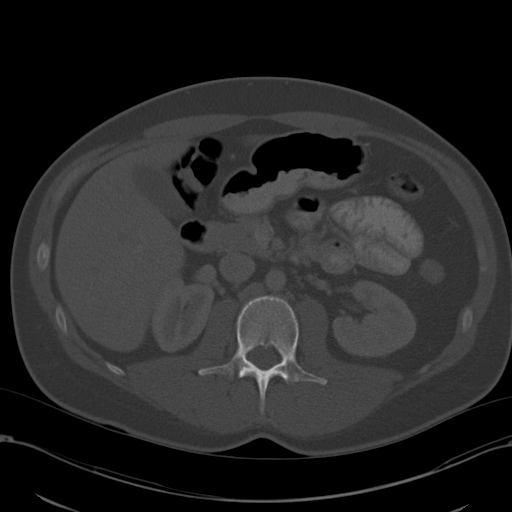
[im 75/94  soft-tissue]
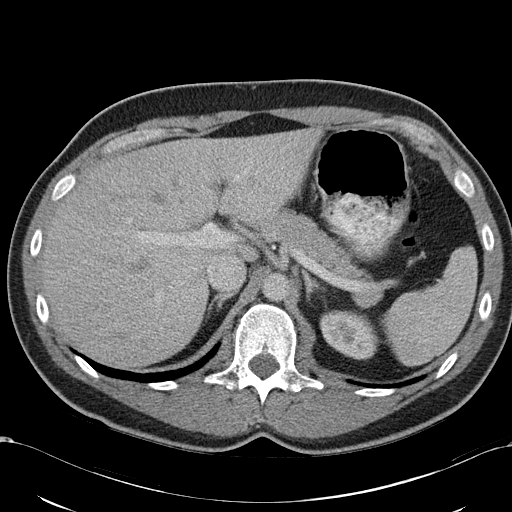
[im 82/94  soft-tissue]
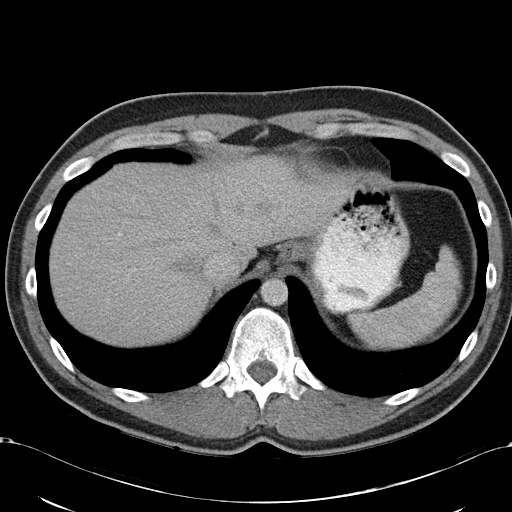
[im 90/94  soft-tissue]
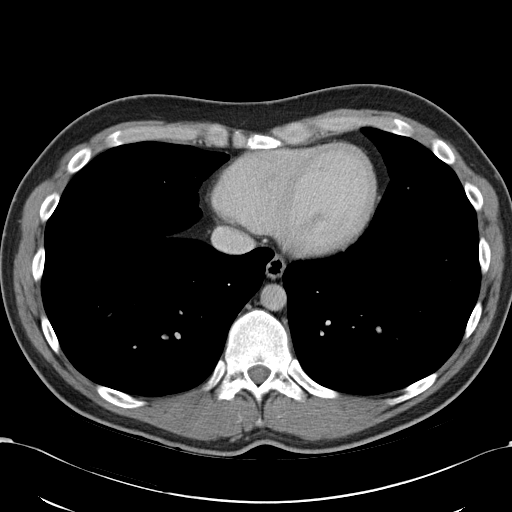

[Series 3: abd_pel_with 3.0 spo cor · coronal · 0.61mm/px · 3 of 86 slices shown]
[im 29/86  soft-tissue]
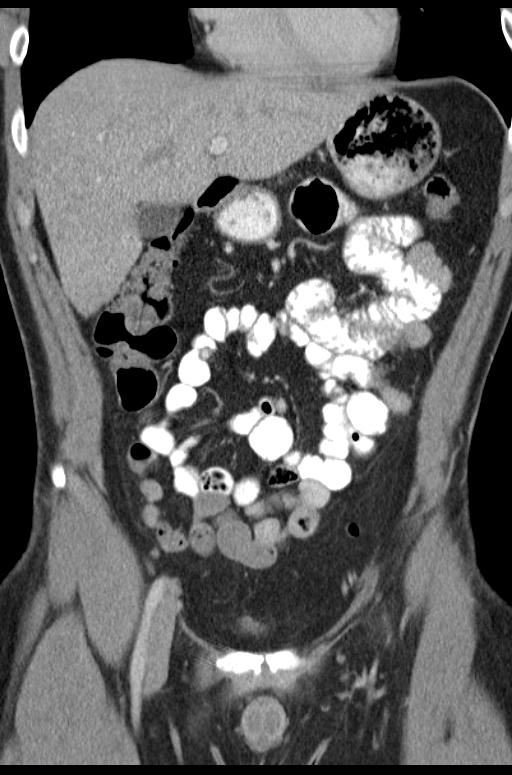
[im 38/86  soft-tissue]
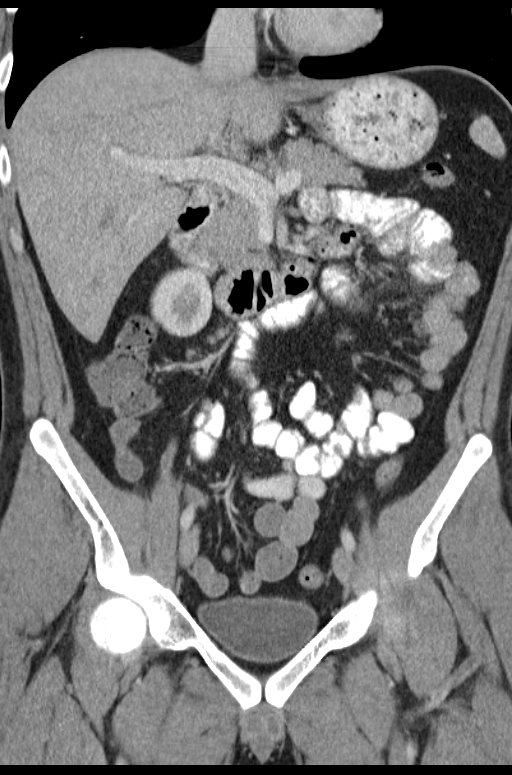
[im 48/86  soft-tissue]
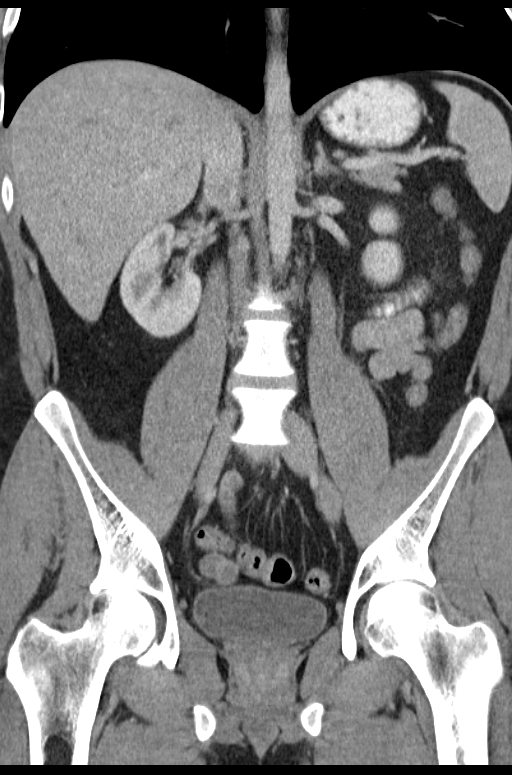

[15 of 46 positions shown; findings below may reference images not displayed]

FINDINGS: Lower chest: Right middle lobe 4 mm solid pulmonary nodule (series
6/image 4), stable since 12/30/2014, cannot compare to the
12/23/2012 CT as this portion of the lung was not included on the
study. Right lower lobe 4 mm solid pulmonary nodule ([DATE]), stable
since 12/23/2012 and benign. Left lower lobe subpleural solid 4 mm
pulmonary nodule ([DATE]), stable since 12/23/2012 and benign. Left
lower lobe 4 mm solid pulmonary nodule ([DATE]), stable since
12/23/2012 and benign.

Hepatobiliary: Normal liver with no liver mass. Normal gallbladder
with no radiopaque cholelithiasis. No biliary ductal dilatation.

Pancreas: Normal, with no mass or duct dilation.

Spleen: Normal size. No mass.

Adrenals/Urinary Tract: Normal adrenals. Normal kidneys with no
hydronephrosis and no renal mass. Normal bladder.

Stomach/Bowel: Grossly normal stomach. Normal caliber small bowel
with no small bowel wall thickening. Normal appendix. The colon is
relatively collapsed. No appreciable large bowel wall thickening or
pericolonic fat stranding. No colonic diverticulosis.

Vascular/Lymphatic: Normal caliber abdominal aorta. Patent portal,
splenic and renal veins. No pathologically enlarged lymph nodes in
the abdomen or pelvis.

Reproductive: Normal prostate.

Other: No pneumoperitoneum, ascites or focal fluid collection.

Musculoskeletal: No aggressive appearing focal osseous lesions.
IMPRESSION: 1. No evidence of bowel obstruction or acute bowel inflammation.
Normal appendix.
2. Right middle lobe 4 mm pulmonary nodule. If the patient is at
high risk for bronchogenic carcinoma, follow-up chest CT at 1 year
is recommended. If the patient is at low risk, no follow-up is
needed. This recommendation follows the consensus statement:
Guidelines for Management of Small Pulmonary Nodules Detected on CT
Scans: A Statement from the [HOSPITAL] as published in

## 2016-07-31 ENCOUNTER — Emergency Department (HOSPITAL_COMMUNITY)
Admission: EM | Admit: 2016-07-31 | Discharge: 2016-08-01 | Disposition: A | Discharge status: Home / Self Care | Payer: Self-pay | Attending: Dermatology | Admitting: Dermatology

## 2016-07-31 ENCOUNTER — Encounter (HOSPITAL_COMMUNITY): Payer: Self-pay

## 2016-07-31 DIAGNOSIS — Z5321 Procedure and treatment not carried out due to patient leaving prior to being seen by health care provider: Secondary | ICD-10-CM | POA: Insufficient documentation

## 2016-07-31 DIAGNOSIS — R109 Unspecified abdominal pain: Secondary | ICD-10-CM | POA: Insufficient documentation

## 2016-07-31 DIAGNOSIS — J45909 Unspecified asthma, uncomplicated: Secondary | ICD-10-CM | POA: Insufficient documentation

## 2016-07-31 DIAGNOSIS — I10 Essential (primary) hypertension: Secondary | ICD-10-CM | POA: Insufficient documentation

## 2016-07-31 DIAGNOSIS — F17210 Nicotine dependence, cigarettes, uncomplicated: Secondary | ICD-10-CM | POA: Insufficient documentation

## 2016-07-31 NOTE — ED Triage Notes (Signed)
Patient states that he took his girlfriend to work over at the [facility] and he used the restroom there, had a pain in his right mid back that was a "10" and he went down in the floor to his knees because the pain was so unbearable.  It was extremely painful when he was urinating.  Did not see blood in his urine.  States that he did not urinate much at that time.

## 2016-08-01 NOTE — ED Notes (Signed)
No answer in waiting room 

## 2016-08-01 NOTE — ED Notes (Signed)
No answer in waiting room X2,  

## 2017-03-14 ENCOUNTER — Emergency Department (HOSPITAL_COMMUNITY): Payer: Self-pay

## 2017-03-14 ENCOUNTER — Emergency Department (HOSPITAL_COMMUNITY)
Admission: EM | Admit: 2017-03-14 | Discharge: 2017-03-14 | Disposition: A | Discharge status: Home / Self Care | Payer: Self-pay | Attending: Emergency Medicine | Admitting: Emergency Medicine

## 2017-03-14 ENCOUNTER — Encounter (HOSPITAL_COMMUNITY): Payer: Self-pay

## 2017-03-14 DIAGNOSIS — I10 Essential (primary) hypertension: Secondary | ICD-10-CM | POA: Insufficient documentation

## 2017-03-14 DIAGNOSIS — F17210 Nicotine dependence, cigarettes, uncomplicated: Secondary | ICD-10-CM | POA: Insufficient documentation

## 2017-03-14 DIAGNOSIS — R1011 Right upper quadrant pain: Secondary | ICD-10-CM | POA: Insufficient documentation

## 2017-03-14 DIAGNOSIS — K2900 Acute gastritis without bleeding: Secondary | ICD-10-CM | POA: Insufficient documentation

## 2017-03-14 DIAGNOSIS — Z79899 Other long term (current) drug therapy: Secondary | ICD-10-CM | POA: Insufficient documentation

## 2017-03-14 DIAGNOSIS — J45909 Unspecified asthma, uncomplicated: Secondary | ICD-10-CM | POA: Insufficient documentation

## 2017-03-14 LAB — CBC WITH DIFFERENTIAL/PLATELET
Basophils Absolute: 0 10*3/uL (ref 0.0–0.1)
Basophils Relative: 0 %
Eosinophils Absolute: 0.1 10*3/uL (ref 0.0–0.7)
Eosinophils Relative: 1 %
HCT: 46.5 % (ref 39.0–52.0)
Hemoglobin: 15.2 g/dL (ref 13.0–17.0)
Lymphocytes Relative: 19 %
Lymphs Abs: 1.5 10*3/uL (ref 0.7–4.0)
MCH: 28.9 pg (ref 26.0–34.0)
MCHC: 32.7 g/dL (ref 30.0–36.0)
MCV: 88.4 fL (ref 78.0–100.0)
Monocytes Absolute: 0.7 10*3/uL (ref 0.1–1.0)
Monocytes Relative: 9 %
Neutro Abs: 5.3 10*3/uL (ref 1.7–7.7)
Neutrophils Relative %: 71 %
Platelets: 263 10*3/uL (ref 150–400)
RBC: 5.26 MIL/uL (ref 4.22–5.81)
RDW: 12.9 % (ref 11.5–15.5)
WBC: 7.6 10*3/uL (ref 4.0–10.5)

## 2017-03-14 LAB — COMPREHENSIVE METABOLIC PANEL
ALT: 13 U/L — ABNORMAL LOW (ref 17–63)
AST: 20 U/L (ref 15–41)
Albumin: 4.3 g/dL (ref 3.5–5.0)
Alkaline Phosphatase: 75 U/L (ref 38–126)
Anion gap: 8 (ref 5–15)
BUN: 20 mg/dL (ref 6–20)
CO2: 25 mmol/L (ref 22–32)
Calcium: 9.3 mg/dL (ref 8.9–10.3)
Chloride: 102 mmol/L (ref 101–111)
Creatinine, Ser: 1 mg/dL (ref 0.61–1.24)
GFR calc Af Amer: >60 mL/min (ref >60)
GFR calc non Af Amer: >60 mL/min (ref >60)
Glucose, Bld: 122 mg/dL — ABNORMAL HIGH (ref 65–99)
Potassium: 4.4 mmol/L (ref 3.5–5.1)
Sodium: 135 mmol/L (ref 135–145)
Total Bilirubin: 0.4 mg/dL (ref 0.3–1.2)
Total Protein: 7.3 g/dL (ref 6.5–8.1)

## 2017-03-14 LAB — URINALYSIS, ROUTINE W REFLEX MICROSCOPIC
Bilirubin Urine: NEGATIVE
Glucose, UA: NEGATIVE mg/dL
Hgb urine dipstick: NEGATIVE
Ketones, ur: NEGATIVE mg/dL
Leukocytes, UA: NEGATIVE
Nitrite: NEGATIVE
Protein, ur: NEGATIVE mg/dL
Specific Gravity, Urine: 1.023 (ref 1.005–1.030)
pH: 5 (ref 5.0–8.0)

## 2017-03-14 LAB — LIPASE, BLOOD: Lipase: 27 U/L (ref 11–51)

## 2017-03-14 MED ORDER — ONDANSETRON HCL 4 MG/2ML IJ SOLN
4.0000 mg | Freq: Once | INTRAMUSCULAR | Status: AC
  Administered 2017-03-14: 4 mg via INTRAVENOUS
  Filled 2017-03-14: qty 2

## 2017-03-14 MED ORDER — PANTOPRAZOLE SODIUM 40 MG IV SOLR
40.0000 mg | Freq: Once | INTRAVENOUS | Status: AC
  Administered 2017-03-14: 40 mg via INTRAVENOUS
  Filled 2017-03-14: qty 40

## 2017-03-14 MED ORDER — IOPAMIDOL (ISOVUE-300) INJECTION 61%
100.0000 mL | Freq: Once | INTRAVENOUS | Status: AC | PRN
  Administered 2017-03-14: 100 mL via INTRAVENOUS

## 2017-03-14 MED ORDER — SODIUM CHLORIDE 0.9 % IV BOLUS (SEPSIS)
1000.0000 mL | Freq: Once | INTRAVENOUS | Status: AC
  Administered 2017-03-14: 1000 mL via INTRAVENOUS

## 2017-03-14 MED ORDER — ONDANSETRON 4 MG PO TBDP: ORAL_TABLET | ORAL | 0 refills | Status: DC

## 2017-03-14 MED ORDER — HYDROMORPHONE HCL 1 MG/ML IJ SOLN
0.5000 mg | Freq: Once | INTRAMUSCULAR | Status: AC
  Administered 2017-03-14: 0.5 mg via INTRAVENOUS
  Filled 2017-03-14: qty 1

## 2017-03-14 MED ORDER — HYDROMORPHONE HCL 1 MG/ML IJ SOLN
0.5000 mg | Freq: Once | INTRAMUSCULAR | Status: AC
  Administered 2017-03-14: 0.5 mg via INTRAVENOUS

## 2017-03-14 MED ORDER — RANITIDINE HCL 150 MG PO TABS: 150.0000 mg | ORAL_TABLET | Freq: Two times a day (BID) | ORAL | 0 refills | Status: DC

## 2017-03-14 MED ORDER — TRAMADOL HCL 50 MG PO TABS: 50.0000 mg | ORAL_TABLET | Freq: Four times a day (QID) | ORAL | 0 refills | Status: DC | PRN

## 2017-03-14 NOTE — ED Notes (Signed)
MD at bedside. 

## 2017-03-14 NOTE — ED Triage Notes (Signed)
Pt reports RUQ pain for the past week then started having diarrhea 2 days ago.  Denies vomiting.  Reports pain gets worse after eating.

## 2017-03-14 NOTE — ED Provider Notes (Signed)
[facility] Provider Note   CSN: [id number] Arrival date & time: 03/14/17  [id number]     History   Chief Complaint Chief Complaint  Patient presents with  . Abdominal Pain    HPI Eric Lowery is a 31 y.o. male.  Patient complains of abdominal pain   The history is provided by the patient. No language interpreter was used.  Abdominal Pain   This is a recurrent problem. The current episode started 2 days ago. The problem occurs constantly. The problem has not changed since onset.The pain is associated with an unknown factor. The pain is located in the epigastric region. The quality of the pain is aching. The pain is at a severity of 6/10. The pain is moderate. Pertinent negatives include diarrhea, frequency, hematuria and headaches.    Past Medical History:  Diagnosis Date  . Asthma   . Chronic back pain   . Chronic neck pain   . GERD (gastroesophageal reflux disease)   . Hypertension     There are no active problems to display for this patient.   Past Surgical History:  Procedure Laterality Date  . HERNIA REPAIR    . thumb surgery         Home Medications    Prior to Admission medications   Medication Sig Start Date End Date Taking? Authorizing Provider  albuterol (PROVENTIL HFA;VENTOLIN HFA) 108 (90 BASE) MCG/ACT inhaler Inhale 1-2 puffs into the lungs every 6 (six) hours as needed for wheezing or shortness of breath. 12/04/14  Yes Donnetta Hutchingook, Brian, MD  HYDROcodone-acetaminophen (NORCO/VICODIN) 5-325 MG tablet Take 1 tablet by mouth every 4 (four) hours as needed. 08/26/15  Yes Jacalyn LefevreHaviland, Julie, MD  propranolol (INDERAL) 80 MG tablet Take 1 tablet (80 mg total) by mouth daily. 05/07/15  Yes Triplett, Tammy, PA-C  ranitidine (ZANTAC) 150 MG tablet Take 1 tablet (150 mg total) by mouth 2 (two) times daily. 03/14/17   Bethann BerkshireZammit, Roniesha Hollingshead, MD    Family History Family History  Problem Relation Age of Onset  . Hypertension Mother   . Seizures Mother   .  Cancer Brother     Social History Social History   Tobacco Use  . Smoking status: Current Every Day Smoker    Packs/day: 1.00    Types: Cigarettes  . Smokeless tobacco: Never Used  Substance Use Topics  . Alcohol use: No    Comment: occ  . Drug use: No     Allergies   Peanuts [nuts]; Penicillins; Shellfish allergy; and Flexeril [cyclobenzaprine hcl]   Review of Systems Review of Systems  Constitutional: Negative for appetite change and fatigue.  HENT: Negative for congestion, ear discharge and sinus pressure.   Eyes: Negative for discharge.  Respiratory: Negative for cough.   Cardiovascular: Negative for chest pain.  Gastrointestinal: Positive for abdominal pain. Negative for diarrhea.  Genitourinary: Negative for frequency and hematuria.  Musculoskeletal: Negative for back pain.  Skin: Negative for rash.  Neurological: Negative for seizures and headaches.  Psychiatric/Behavioral: Negative for hallucinations.     Physical Exam Updated Vital Signs BP 121/76 (BP Location: Left Arm)   Pulse (!) 103   Temp 98.7 F (37.1 C) (Oral)   Resp 13   Ht 6\' 2"  (1.88 m)   Wt 95.3 kg (210 lb)   SpO2 97%   BMI 26.96 kg/m   Physical Exam  Constitutional: He is oriented to person, place, and time. He appears well-developed.  HENT:  Head: Normocephalic.  Eyes: Conjunctivae and EOM  are normal. No scleral icterus.  Neck: Neck supple. No thyromegaly present.  Cardiovascular: Normal rate and regular rhythm. Exam reveals no gallop and no friction rub.  No murmur heard. Pulmonary/Chest: No stridor. He has no wheezes. He has no rales. He exhibits no tenderness.  Abdominal: He exhibits no distension. There is tenderness. There is no rebound.  Musculoskeletal: Normal range of motion. He exhibits no edema.  Lymphadenopathy:    He has no cervical adenopathy.  Neurological: He is oriented to person, place, and time. He exhibits normal muscle tone. Coordination normal.  Skin: No rash  noted. No erythema.  Psychiatric: He has a normal mood and affect. His behavior is normal.     ED Treatments / Results  Labs (all labs ordered are listed, but only abnormal results are displayed) Labs Reviewed  COMPREHENSIVE METABOLIC PANEL - Abnormal; Notable for the following components:      Result Value   Glucose, Bld 122 (*)    ALT 13 (*)    All other components within normal limits  CBC WITH DIFFERENTIAL/PLATELET  URINALYSIS, ROUTINE W REFLEX MICROSCOPIC  LIPASE, BLOOD    EKG  EKG Interpretation None       Radiology US Abdomen Complete  Result Date: 03/14/2017 CLINICAL DATA:  Right quadrant pain. EXAM: ABDOMEN ULTRASOUND COMPLETE COMPARISON:  CT 01/17/2015. FINDINGS: Gallbladder: No gallstones or wall thickening visualized. No sonographic Murphy sign noted by sonographer. Common bile duct: Diameter: 2.4 mm Liver: No focal lesion identified. Within normal limits in parenchymal echogenicity. Portal vein is patent on color Doppler imaging with normal direction of blood flow towards the liver. IVC: No abnormality visualized. Pancreas: Visualized portion unremarkable. Spleen: Size and appearance within normal limits. Right Kidney: Length: 10.2 cm. Echogenicity within normal limits. No mass or hydronephrosis visualized. Left Kidney: Length: 10.7 cm. Echogenicity within normal limits. No mass or hydronephrosis visualized. Abdominal aorta: No aneurysm visualized. Other findings: None. IMPRESSION: No acute or focal abnormality identified. Electronically Signed   By: Maisie Fus  Register   On: 03/14/2017 09:17   Ct Abdomen Pelvis W Contrast  Result Date: 03/14/2017 CLINICAL DATA:  RUQ pain for the past week then started having diarrhea 2 days ago. Denies vomiting. Reports pain gets worse after eating EXAM: CT ABDOMEN AND PELVIS WITH CONTRAST TECHNIQUE: Multidetector CT imaging of the abdomen and pelvis was performed using the standard protocol following bolus administration of intravenous  contrast. CONTRAST:  ISOVUE-300 IOPAMIDOL (ISOVUE-300) INJECTION 61% COMPARISON:  01/17/2015 FINDINGS: Lower chest: No acute abnormality. 4 mm left lower lobe pulmonary nodule. Hepatobiliary: No focal liver abnormality is seen. No gallstones, gallbladder wall thickening, or biliary dilatation. Pancreas: Unremarkable. No pancreatic ductal dilatation or surrounding inflammatory changes. Spleen: Normal in size without focal abnormality. Adrenals/Urinary Tract: Adrenal glands are unremarkable. Kidneys are normal, without renal calculi, focal lesion, or hydronephrosis. Bladder is unremarkable. Stomach/Bowel: Stomach is within normal limits. Appendix appears normal. No evidence of bowel wall thickening, distention, or inflammatory changes. Vascular/Lymphatic: No significant vascular findings are present. No enlarged abdominal or pelvic lymph nodes. Reproductive: Prostate is unremarkable. Other: No abdominal wall hernia or abnormality. No abdominopelvic ascites. Musculoskeletal: No acute or significant osseous findings. IMPRESSION: 1. No acute abdominal or pelvic pathology. 2. 4 mm left lower lobe pulmonary nodule. If the patient is at high risk for bronchogenic carcinoma, follow-up chest CT at 1year is recommended. If the patient is at low risk, no follow-up is needed. This recommendation follows the consensus statement: Guidelines for Management of Small Pulmonary Nodules Detected on  CT Scans: A Statement from the Fleischner Society as published in Radiology 2005; 237:395-400. Electronically Signed   By: Elige KoHetal  Patel   On: 03/14/2017 11:15    Procedures Procedures (including critical care time)  Medications Ordered in ED Medications  sodium chloride 0.9 % bolus 1,000 mL (0 mLs Intravenous Stopped 03/14/17 0946)  pantoprazole (PROTONIX) injection 40 mg (40 mg Intravenous Given 03/14/17 0821)  ondansetron (ZOFRAN) injection 4 mg (4 mg Intravenous Given 03/14/17 0821)  HYDROmorphone (DILAUDID) injection 0.5  mg (0.5 mg Intravenous Given 03/14/17 [id number])  HYDROmorphone (DILAUDID) injection 0.5 mg (0.5 mg Intravenous Given 03/14/17 0938)  iopamidol (ISOVUE-300) 61 % injection 100 mL (100 mLs Intravenous Contrast Given 03/14/17 1057)  HYDROmorphone (DILAUDID) injection 0.5 mg (0.5 mg Intravenous Given 03/14/17 1139)  ondansetron (ZOFRAN) injection 4 mg (4 mg Intravenous Given 03/14/17 1139)     Initial Impression / Assessment and Plan / ED Course  I have reviewed the triage vital signs and the nursing notes.  Pertinent labs & imaging results that were available during my care of the patient were reviewed by me and considered in my medical decision making (see chart for details).     Patient with epigastric abdominal pain.  CT and ultrasound are negative.  Patient will be placed on Zantac and will follow up with his PCP or GI  Final Clinical Impressions(s) / ED Diagnoses   Final diagnoses:  Acute superficial gastritis without hemorrhage    ED Discharge Orders        Ordered    ranitidine (ZANTAC) 150 MG tablet  2 times daily     03/14/17 1331       Bethann BerkshireZammit, Jersie Beel, MD 03/14/17 1340

## 2017-03-14 NOTE — ED Notes (Signed)
Pt back from CT

## 2017-03-14 NOTE — ED Notes (Signed)
Pt taken to US

## 2017-03-14 NOTE — ED Notes (Signed)
Pt to CT

## 2017-03-14 NOTE — Discharge Instructions (Signed)
Follow up with your family md or Dr. Karilyn Cotaehman in 2-3 weeks.   Try to stop drinking caffeine and citrus juices like orange and grapefruit.  Also stop smoking.

## 2017-08-22 ENCOUNTER — Encounter (HOSPITAL_COMMUNITY): Payer: Self-pay | Admitting: Emergency Medicine

## 2017-08-22 ENCOUNTER — Other Ambulatory Visit: Payer: Self-pay

## 2017-08-22 ENCOUNTER — Emergency Department (HOSPITAL_COMMUNITY): Payer: Self-pay

## 2017-08-22 ENCOUNTER — Emergency Department (HOSPITAL_COMMUNITY)
Admission: EM | Admit: 2017-08-22 | Discharge: 2017-08-22 | Disposition: A | Discharge status: Home / Self Care | Payer: Self-pay | Attending: Emergency Medicine | Admitting: Emergency Medicine

## 2017-08-22 DIAGNOSIS — R531 Weakness: Secondary | ICD-10-CM | POA: Insufficient documentation

## 2017-08-22 DIAGNOSIS — R072 Precordial pain: Secondary | ICD-10-CM | POA: Insufficient documentation

## 2017-08-22 DIAGNOSIS — R0602 Shortness of breath: Secondary | ICD-10-CM | POA: Insufficient documentation

## 2017-08-22 DIAGNOSIS — I10 Essential (primary) hypertension: Secondary | ICD-10-CM | POA: Insufficient documentation

## 2017-08-22 DIAGNOSIS — J45909 Unspecified asthma, uncomplicated: Secondary | ICD-10-CM | POA: Insufficient documentation

## 2017-08-22 DIAGNOSIS — F17210 Nicotine dependence, cigarettes, uncomplicated: Secondary | ICD-10-CM | POA: Insufficient documentation

## 2017-08-22 LAB — CBC
HCT: 46.4 % (ref 39.0–52.0)
Hemoglobin: 15.7 g/dL (ref 13.0–17.0)
MCH: 28.9 pg (ref 26.0–34.0)
MCHC: 33.8 g/dL (ref 30.0–36.0)
MCV: 85.5 fL (ref 78.0–100.0)
Platelets: 330 10*3/uL (ref 150–400)
RBC: 5.43 MIL/uL (ref 4.22–5.81)
RDW: 12.6 % (ref 11.5–15.5)
WBC: 9.6 10*3/uL (ref 4.0–10.5)

## 2017-08-22 LAB — BASIC METABOLIC PANEL
Anion gap: 9 (ref 5–15)
BUN: 19 mg/dL (ref 6–20)
CO2: 29 mmol/L (ref 22–32)
Calcium: 9.7 mg/dL (ref 8.9–10.3)
Chloride: 99 mmol/L — ABNORMAL LOW (ref 101–111)
Creatinine, Ser: 1 mg/dL (ref 0.61–1.24)
GFR calc Af Amer: >60 mL/min (ref >60)
GFR calc non Af Amer: >60 mL/min (ref >60)
Glucose, Bld: 83 mg/dL (ref 65–99)
Potassium: 4.2 mmol/L (ref 3.5–5.1)
Sodium: 137 mmol/L (ref 135–145)

## 2017-08-22 LAB — TROPONIN I
Troponin I: <0.03 ng/mL (ref <0.03)
Troponin I: <0.03 ng/mL (ref <0.03)

## 2017-08-22 LAB — D-DIMER, QUANTITATIVE: D-Dimer, Quant: <0.27 ug/mL-FEU (ref 0.00–0.50)

## 2017-08-22 MED ORDER — MORPHINE SULFATE (PF) 4 MG/ML IV SOLN
4.0000 mg | Freq: Once | INTRAVENOUS | Status: AC
Start: 2017-08-22 — End: 2017-08-22
  Administered 2017-08-22: 4 mg via INTRAVENOUS
  Filled 2017-08-22: qty 1

## 2017-08-22 MED ORDER — ASPIRIN 81 MG PO CHEW
324.0000 mg | CHEWABLE_TABLET | Freq: Once | ORAL | Status: AC
  Administered 2017-08-22: 324 mg via ORAL
  Filled 2017-08-22: qty 4

## 2017-08-22 MED ORDER — KETOROLAC TROMETHAMINE 30 MG/ML IJ SOLN
30.0000 mg | Freq: Once | INTRAMUSCULAR | Status: AC
  Administered 2017-08-22: 30 mg via INTRAVENOUS
  Filled 2017-08-22: qty 1

## 2017-08-22 MED ORDER — TRAMADOL HCL 50 MG PO TABS: 50.0000 mg | ORAL_TABLET | Freq: Four times a day (QID) | ORAL | 0 refills | Status: DC | PRN

## 2017-08-22 MED ORDER — IBUPROFEN 800 MG PO TABS: 800.0000 mg | ORAL_TABLET | Freq: Three times a day (TID) | ORAL | 0 refills | Status: DC | PRN

## 2017-08-22 NOTE — ED Triage Notes (Signed)
Pt was working outside this morning, had sudden onset of chest tightness, SOB, nausea, more diaphoretic.  Hx of HTN.

## 2017-08-22 NOTE — Discharge Instructions (Signed)

## 2017-08-22 NOTE — ED Provider Notes (Signed)
Emergency Department Provider Note   I have reviewed the triage vital signs and the nursing notes.   HISTORY  Chief Complaint Chest Pain   HPI Eric Lowery is a 32 y.o. male with PMH of asthma, HTN, GERD, and chronic neck/back pain to the emergency department for evaluation of sudden onset chest tightness with dyspnea, nausea, diaphoresis.  Patient noticed that somewhere around this time he felt subjectively weak in the left arm and leg.  He does not recall an exact time that he was last normal but felt like he was okay when he woke up this morning.  He has never had these chest pain symptoms in the past.  He is afebrile.  He denies any numbness in the arms or legs.  He noticed this morning while working he had to use his right arm primarily to help with lifting.  He is not having any pain in the neck or back at this time.  He denies any vision changes or speech disturbance.  Chest pain is nonradiating. No modifying factors.    Past Medical History:  Diagnosis Date  . Asthma   . Chronic back pain   . Chronic neck pain   . GERD (gastroesophageal reflux disease)   . Hypertension     There are no active problems to display for this patient.   Past Surgical History:  Procedure Laterality Date  . HERNIA REPAIR    . thumb surgery      Current Outpatient Rx  . Order #: [id number] Class: Print  . Order #: [id number] Class: Print  . Order #: [id number] Class: Print  . Order #: [id number] Class: Print  . Order #: [id number] Class: Print    Allergies Peanuts [nuts]; Penicillins; Shellfish allergy; and Flexeril [cyclobenzaprine hcl]  Family History  Problem Relation Age of Onset  . Hypertension Mother   . Seizures Mother   . Cancer Brother     Social History Social History   Tobacco Use  . Smoking status: Current Every Day Smoker    Packs/day: 0.50    Types: Cigarettes  . Smokeless tobacco: Never Used  Substance Use Topics  . Alcohol use: No    Comment: occ  . Drug use:  No    Review of Systems  Constitutional: No fever/chills. Positive diaphoresis.  Eyes: No visual changes. ENT: No sore throat. Cardiovascular: Positive chest pain. Respiratory: Positive shortness of breath. Gastrointestinal: No abdominal pain.  No nausea, no vomiting.  No diarrhea.  No constipation. Genitourinary: Negative for dysuria. Musculoskeletal: Negative for back pain. Skin: Negative for rash. Neurological: Negative for headaches or numbness. Positive left arm/leg weakness with unknown start time.   10-point ROS otherwise negative.  ____________________________________________   PHYSICAL EXAM:  VITAL SIGNS: ED Triage Vitals  Enc Vitals Group     BP 08/22/17 1004 (!) 153/107     Pulse Rate 08/22/17 1005 (!) 109     Resp 08/22/17 1013 13     SpO2 08/22/17 1005 100 %     Weight 08/22/17 1005 204 lb (92.5 kg)     Height 08/22/17 1005  (1.854 m)     Pain Score 08/22/17 1004 8   Constitutional: Alert and oriented. Well appearing and in no acute distress. Eyes: Conjunctivae are normal.  Head: Atraumatic. Nose: No congestion/rhinnorhea. Mouth/Throat: Mucous membranes are moist.  Neck: No stridor. Cardiovascular: Tachycardia. Good peripheral circulation. Grossly normal heart sounds.   Respiratory: Normal respiratory effort.  No retractions. Lungs CTAB. Gastrointestinal: Soft and nontender. No distention.  Musculoskeletal: No lower extremity tenderness nor edema. No gross deformities of extremities. Neurologic:  Normal speech and language. Patient with faint decreased grip strength on the left with otherwise normal strength in biceps/triceps. No pronator drift. Normal strength in the LLE. Normal CN exam 2-12.  Skin:  Skin is warm, dry and intact. No rash noted.  ____________________________________________   LABS (all labs ordered are listed, but only abnormal results are displayed)  Labs Reviewed  BASIC METABOLIC PANEL - Abnormal; Notable for the following  components:      Result Value   Chloride 99 (*)    All other components within normal limits  CBC  TROPONIN I  D-DIMER, QUANTITATIVE (NOT AT [facility])  TROPONIN I   ____________________________________________  EKG   EKG Interpretation  Date/Time:  Tuesday Aug 22 2017 10:11:13 EDT Ventricular Rate:  100 PR Interval:    QRS Duration: 87 QT Interval:  328 QTC Calculation: 423 R Axis:   45 Text Interpretation:  Sinus tachycardia No STEMI.  Confirmed by Alona Bene [phone]) on 08/22/2017 10:21:24 AM       ____________________________________________  RADIOLOGY  Dg Chest 2 View  Result Date: 08/22/2017 CLINICAL DATA:  Onset of upper chest tightness radiating to the left arm this morning while working outside. Current smoker. History of asthma. EXAM: CHEST - 2 VIEW COMPARISON:  CT scan of the chest of Aug 26, 2015 and chest x-ray of Aug 26, 2015. FINDINGS: The lungs are well-expanded. The interstitial markings are coarse though stable. The heart and pulmonary vascularity are normal. The mediastinum is normal in width. There is stable dextrocurvature of the thoracic spine centered at approximately T7. The gas pattern in the upper abdomen is normal. IMPRESSION: Chronic bronchitic-smoking related interstitial changes, stable. No CHF or other acute cardiopulmonary abnormality. Electronically Signed   By: Jahleel  [place] M.D.   On: 08/22/2017 11:46   Ct Head Wo Contrast  Result Date: 08/22/2017 CLINICAL DATA:  Increase shortness of breath and chest tightness following working outside EXAM: CT HEAD WITHOUT CONTRAST TECHNIQUE: Contiguous axial images were obtained from the base of the skull through the vertex without intravenous contrast. COMPARISON:  08/26/2015 FINDINGS: Brain: No evidence of acute infarction, hemorrhage, hydrocephalus, extra-axial collection or mass lesion/mass effect. Vascular: No hyperdense vessel or unexpected calcification. Skull: Normal. Negative for fracture or focal lesion.  Sinuses/Orbits: No acute finding. Other: None. IMPRESSION: Normal head CT Electronically Signed   By: Alcide Clever M.D.   On: 08/22/2017 11:55   Mr Brain Wo Contrast  Result Date: 08/22/2017 CLINICAL DATA:  Focal neuro deficit. Sudden onset chest tightness, shortness of breath, nausea, and diaphoresis while working outside. EXAM: MRI HEAD WITHOUT CONTRAST TECHNIQUE: Multiplanar, multiecho pulse sequences of the brain and surrounding structures were obtained without intravenous contrast. COMPARISON:  Head CT 08/22/2017 and MRI 07/01/2003 FINDINGS: Brain: There is no evidence of acute infarct, intracranial hemorrhage, mass, midline shift, or extra-axial fluid collection. The ventricles and sulci are normal. The brain is normal in signal. Vascular: Major intracranial vascular flow voids are preserved. Skull and upper cervical spine: Unremarkable bone marrow signal. Sinuses/Orbits: Unremarkable orbits. Left maxillary sinus mucous retention cyst. Small bilateral mastoid effusions, also present in 2005. Other: None. IMPRESSION: Unremarkable appearance of the brain. Electronically Signed   By: Sebastian Ache M.D.   On: 08/22/2017 13:19   Mr Cervical Spine Wo Contrast  Result Date: 08/22/2017 CLINICAL DATA:  Cervical spinal stenosis. Sudden onset chest tightness, shortness of breath, nausea, and diaphoresis while working outside. EXAM: MRI  CERVICAL SPINE WITHOUT CONTRAST TECHNIQUE: Multiplanar, multisequence MR imaging of the cervical spine was performed. No intravenous contrast was administered. COMPARISON:  Cervical spine CT 05/07/2015 and MRI 08/26/2009 FINDINGS: The study is mildly motion degraded. Alignment: Normal. Vertebrae: No fracture, suspicious osseous lesion, or significant marrow edema. Cord: Normal signal and morphology. Posterior Fossa, vertebral arteries, paraspinal tissues: Unremarkable. Disc levels: Intervertebral disc space heights are preserved. Minimal disc bulging at C5-6 and C6-7 is similar to the  prior MRI and does not result in spinal canal or neural foraminal stenosis. IMPRESSION: Unchanged minimal cervical spondylosis without evidence of neural impingement. Electronically Signed   By: Sebastian Ache M.D.   On: 08/22/2017 13:23    ____________________________________________   PROCEDURES  Procedure(s) performed:   Procedures  None ____________________________________________   INITIAL IMPRESSION / ASSESSMENT AND PLAN / ED COURSE  Pertinent labs & imaging results that were available during my care of the patient were reviewed by me and considered in my medical decision making (see chart for details).  Patient presents to the emergency department with chest pain, shortness of breath, and diaphoresis.  As a secondary complaint the patient also mentions left arm and leg weakness.  On exam he does have some decreased grip strength in the left upper extremity and decreased strength in the left lower extremity.  No cranial nerve findings.  No concern for spine injury.  He does have  Patient's labs reviewed with no acute findings. MRI of the brain and c-spine with no acute findings. CXR reviewed with no infiltrate, PNX, and normal mediastinum. No findings on exam or by history to strongly suspect vascular etiology of symptoms. Vitals signs have improved over the ED course. Serial troponin is negative. Plan for outpatient Cardiology and Neurology consultation as an outpatient. Discussed ED return precautions in detail.   At this time, I do not feel there is any life-threatening condition present. I have reviewed and discussed all results (EKG, imaging, lab, urine as appropriate), exam findings with patient. I have reviewed nursing notes and appropriate previous records.  I feel the patient is safe to be discharged home without further emergent workup. Discussed usual and customary return precautions. Patient and family (if present) verbalize understanding and are comfortable with this plan.   Patient will follow-up with their primary care provider. If they do not have a primary care provider, information for follow-up has been provided to them. All questions have been answered.  ____________________________________________  FINAL CLINICAL IMPRESSION(S) / ED DIAGNOSES  Final diagnoses:  Precordial chest pain  Shortness of breath  Left-sided weakness     MEDICATIONS GIVEN DURING THIS VISIT:  Medications  ketorolac (TORADOL) 30 MG/ML injection 30 mg (30 mg Intravenous Given 08/22/17 1110)  morphine 4 MG/ML injection 4 mg (4 mg Intravenous Given 08/22/17 1312)  aspirin chewable tablet 324 mg (324 mg Oral Given 08/22/17 1312)     NEW OUTPATIENT MEDICATIONS STARTED DURING THIS VISIT:  New Prescriptions   IBUPROFEN (ADVIL,MOTRIN) 800 MG TABLET    Take 1 tablet (800 mg total) by mouth every 8 (eight) hours as needed.   TRAMADOL (ULTRAM) 50 MG TABLET    Take 1 tablet (50 mg total) by mouth every 6 (six) hours as needed.    Note:  This document was prepared using Dragon voice recognition software and may include unintentional dictation errors.  Alona Bene, MD Emergency Medicine    Omar Gayden, Arlyss Repress, MD 08/22/17 [id number]

## 2017-08-22 NOTE — ED Notes (Signed)
Patient transported to MRI 

## 2017-12-14 ENCOUNTER — Encounter (HOSPITAL_COMMUNITY): Payer: Self-pay | Admitting: *Deleted

## 2017-12-14 ENCOUNTER — Emergency Department (HOSPITAL_COMMUNITY): Payer: Self-pay

## 2017-12-14 ENCOUNTER — Other Ambulatory Visit: Payer: Self-pay

## 2017-12-14 ENCOUNTER — Emergency Department (HOSPITAL_COMMUNITY)
Admission: EM | Admit: 2017-12-14 | Discharge: 2017-12-15 | Disposition: A | Discharge status: Home / Self Care | Payer: Self-pay | Attending: Emergency Medicine | Admitting: Emergency Medicine

## 2017-12-14 DIAGNOSIS — R109 Unspecified abdominal pain: Secondary | ICD-10-CM

## 2017-12-14 DIAGNOSIS — Z91010 Allergy to peanuts: Secondary | ICD-10-CM | POA: Insufficient documentation

## 2017-12-14 DIAGNOSIS — F17210 Nicotine dependence, cigarettes, uncomplicated: Secondary | ICD-10-CM | POA: Insufficient documentation

## 2017-12-14 DIAGNOSIS — I10 Essential (primary) hypertension: Secondary | ICD-10-CM | POA: Insufficient documentation

## 2017-12-14 DIAGNOSIS — L0231 Cutaneous abscess of buttock: Secondary | ICD-10-CM | POA: Insufficient documentation

## 2017-12-14 DIAGNOSIS — R1031 Right lower quadrant pain: Secondary | ICD-10-CM | POA: Insufficient documentation

## 2017-12-14 DIAGNOSIS — L03317 Cellulitis of buttock: Secondary | ICD-10-CM | POA: Insufficient documentation

## 2017-12-14 DIAGNOSIS — R0789 Other chest pain: Secondary | ICD-10-CM | POA: Insufficient documentation

## 2017-12-14 DIAGNOSIS — J45909 Unspecified asthma, uncomplicated: Secondary | ICD-10-CM | POA: Insufficient documentation

## 2017-12-14 LAB — BASIC METABOLIC PANEL
Anion gap: 8 (ref 5–15)
BUN: 15 mg/dL (ref 6–20)
CO2: 26 mmol/L (ref 22–32)
Calcium: 9.3 mg/dL (ref 8.9–10.3)
Chloride: 101 mmol/L (ref 98–111)
Creatinine, Ser: 0.96 mg/dL (ref 0.61–1.24)
GFR calc Af Amer: >60 mL/min (ref >60)
GFR calc non Af Amer: >60 mL/min (ref >60)
Glucose, Bld: 99 mg/dL (ref 70–99)
Potassium: 4.2 mmol/L (ref 3.5–5.1)
Sodium: 135 mmol/L (ref 135–145)

## 2017-12-14 LAB — URINALYSIS, ROUTINE W REFLEX MICROSCOPIC
Bilirubin Urine: NEGATIVE
Glucose, UA: NEGATIVE mg/dL
Hgb urine dipstick: NEGATIVE
Ketones, ur: NEGATIVE mg/dL
Leukocytes, UA: NEGATIVE
Nitrite: NEGATIVE
Protein, ur: NEGATIVE mg/dL
Specific Gravity, Urine: 1.019 (ref 1.005–1.030)
pH: 6 (ref 5.0–8.0)

## 2017-12-14 LAB — CBC
HCT: 41.6 % (ref 39.0–52.0)
Hemoglobin: 14.6 g/dL (ref 13.0–17.0)
MCH: 30.5 pg (ref 26.0–34.0)
MCHC: 35.1 g/dL (ref 30.0–36.0)
MCV: 86.8 fL (ref 78.0–100.0)
Platelets: 282 10*3/uL (ref 150–400)
RBC: 4.79 MIL/uL (ref 4.22–5.81)
RDW: 12.7 % (ref 11.5–15.5)
WBC: 14.6 10*3/uL — ABNORMAL HIGH (ref 4.0–10.5)

## 2017-12-14 LAB — HEPATIC FUNCTION PANEL
ALT: 20 U/L (ref 0–44)
AST: 27 U/L (ref 15–41)
Albumin: 4.3 g/dL (ref 3.5–5.0)
Alkaline Phosphatase: 73 U/L (ref 38–126)
Bilirubin, Direct: 0.1 mg/dL (ref 0.0–0.2)
Indirect Bilirubin: 0.6 mg/dL (ref 0.3–0.9)
Total Bilirubin: 0.7 mg/dL (ref 0.3–1.2)
Total Protein: 7.5 g/dL (ref 6.5–8.1)

## 2017-12-14 LAB — I-STAT TROPONIN, ED: Troponin i, poc: 0 ng/mL (ref 0.00–0.08)

## 2017-12-14 LAB — LIPASE, BLOOD: Lipase: 27 U/L (ref 11–51)

## 2017-12-14 MED ORDER — LIDOCAINE-EPINEPHRINE (PF) 2 %-1:200000 IJ SOLN
10.0000 mL | Freq: Once | INTRAMUSCULAR | Status: AC
  Administered 2017-12-14: 10 mL
  Filled 2017-12-14: qty 20

## 2017-12-14 MED ORDER — SODIUM CHLORIDE 0.9 % IV BOLUS
1000.0000 mL | Freq: Once | INTRAVENOUS | Status: AC
  Administered 2017-12-14: 1000 mL via INTRAVENOUS

## 2017-12-14 MED ORDER — KETOROLAC TROMETHAMINE 30 MG/ML IJ SOLN
30.0000 mg | Freq: Once | INTRAMUSCULAR | Status: AC
  Administered 2017-12-14: 30 mg via INTRAVENOUS
  Filled 2017-12-14: qty 1

## 2017-12-14 MED ORDER — MORPHINE SULFATE (PF) 4 MG/ML IV SOLN
4.0000 mg | Freq: Once | INTRAVENOUS | Status: AC
  Administered 2017-12-14: 4 mg via INTRAVENOUS
  Filled 2017-12-14: qty 1

## 2017-12-14 MED ORDER — ONDANSETRON HCL 4 MG/2ML IJ SOLN
4.0000 mg | Freq: Once | INTRAMUSCULAR | Status: AC
  Administered 2017-12-14: 4 mg via INTRAVENOUS
  Filled 2017-12-14: qty 2

## 2017-12-14 NOTE — ED Triage Notes (Signed)
Pt c/o right side rib pain x 2 days; pt has a cyst to that same side and states he has been trying to pop it; pt states the glands in his groin area have been tender to the touch since this has startee

## 2017-12-14 NOTE — ED Notes (Signed)
Patient transported to CT 

## 2017-12-14 NOTE — ED Provider Notes (Signed)
[facility] EMERGENCY DEPARTMENT Provider Note   CSN: [id number] Arrival date & time: 12/14/17  2117     History   Chief Complaint Chief Complaint  Patient presents with  . Chest Pain    HPI Eric Lowery is a 32 y.o. male.  Eric Lowery is a 32 y.o. Male with hx of hypertension, asthma, GERD, chronic neck and back pain, who presents to the ED for evaluation of pain over the right ribs and flank radiating towards the groin for the past two days, as well as abscess to the right buttock. Pain started two days ago, no inciting injury or trauma, since them has been a constant ache that intermittently becomes more sharp. Pain starts over the ribs and right flank, but no central chest pain or shortness of breath. Pain is not exertional and does not radiate to the arm neck or jaw. No midline back pain. Pt reports few episodes of emesis yesterday, no hematemesis, regular bowel movements, no diarrhea, melena or hematochezia. No dysuria, frequency or hematuria. No fevers or chills. Pt does report for the past several days he has had an abscess or boil on the right buttock that has become increasingly painful, pain worse with palpation or sitting, no pain with bowel movmements. He has been trying to pop it, but unable to get the area to drain. No history of these in the past. No medications to treat symptoms prior to arrival, no other aggravating or alleviating factors.     Past Medical History:  Diagnosis Date  . Asthma   . Chronic back pain   . Chronic neck pain   . GERD (gastroesophageal reflux disease)   . Hypertension     There are no active problems to display for this patient.   Past Surgical History:  Procedure Laterality Date  . HERNIA REPAIR    . thumb surgery          Home Medications    Prior to Admission medications   Medication Sig Start Date End Date Taking? Authorizing Provider  albuterol (PROVENTIL HFA;VENTOLIN HFA) 108 (90 BASE) MCG/ACT inhaler Inhale 1-2  puffs into the lungs every 6 (six) hours as needed for wheezing or shortness of breath. 12/04/14  Yes Donnetta Hutching, MD  propranolol (INDERAL) 80 MG tablet Take 1 tablet (80 mg total) by mouth daily. Patient taking differently: Take 40 mg by mouth daily.  05/07/15  Yes Triplett, Tammy, PA-C  doxycycline (VIBRAMYCIN) 100 MG capsule Take 1 capsule (100 mg total) by mouth 2 (two) times daily. One po bid x 7 days 12/15/17   Dartha Lodge, PA-C    Family History Family History  Problem Relation Age of Onset  . Hypertension Mother   . Seizures Mother   . Cancer Brother     Social History Social History   Tobacco Use  . Smoking status: Current Every Day Smoker    Packs/day: 0.50    Types: Cigarettes  . Smokeless tobacco: Never Used  Substance Use Topics  . Alcohol use: No    Comment: occ  . Drug use: No     Allergies   Peanuts [nuts]; Penicillins; Shellfish allergy; and Flexeril [cyclobenzaprine hcl]   Review of Systems Review of Systems  Constitutional: Negative for chills and fever.  HENT: Negative.   Respiratory: Negative for cough and shortness of breath.   Cardiovascular: Positive for chest pain. Negative for palpitations and leg swelling.  Gastrointestinal: Positive for abdominal pain, nausea and vomiting. Negative for blood  in stool, constipation, diarrhea and rectal pain.  Genitourinary: Positive for flank pain. Negative for discharge, dysuria, frequency, genital sores, hematuria, penile pain, penile swelling, scrotal swelling and testicular pain.  Musculoskeletal: Negative for arthralgias, back pain and myalgias.  Skin: Positive for color change.       Abscess  Neurological: Negative for dizziness, syncope, weakness, light-headedness, numbness and headaches.     Physical Exam Updated Vital Signs BP 133/87 (BP Location: Right Arm)   Pulse (!) 101   Temp 98.4 F (36.9 C) (Oral)   Resp 20   Ht 6\' 2"  (1.88 m)   Wt 95.3 kg   SpO2 96%   BMI 26.96 kg/m   Physical  Exam  Constitutional: He is oriented to person, place, and time. He appears well-developed and well-nourished. He does not appear ill. No distress.  HENT:  Head: Normocephalic and atraumatic.  Mouth/Throat: Oropharynx is clear and moist.  Eyes: Pupils are equal, round, and reactive to light. EOM are normal. Right eye exhibits no discharge. Left eye exhibits no discharge.  Neck: Normal range of motion. Neck supple.  Cardiovascular: Normal rate, regular rhythm, normal heart sounds and intact distal pulses.  Pulses:      Radial pulses are 2+ on the right side, and 2+ on the left side.       Dorsalis pedis pulses are 2+ on the right side, and 2+ on the left side.       Posterior tibial pulses are 2+ on the right side, and 2+ on the left side.  Pulmonary/Chest: Effort normal and breath sounds normal. No respiratory distress. He has no wheezes. He has no rales.  Respirations equal and unlabored, patient able to speak in full sentences, lungs clear to auscultation bilaterally, mild tenderness over right lower ribs without palpable deformity or crepitus, no overlying ecchymosis or erythema  Abdominal: Soft. Bowel sounds are normal. He exhibits no distension and no mass. There is tenderness. There is no guarding.  Abdomen soft, non-distended, no ecchymosis or erythema, bowel sounds present throughout, mild tenderness to palpation over the right flank and abdomen without guarding, neg murphy's sign, no tenderness at McBurney's point.  Genitourinary:  Genitourinary Comments: 2 cm abscess with small amount of palpable fluctuance and necrotic center, 5 cm of surrounding erythema and induration, does not appear to track towards the rectum. No expressible drainage with palpation  Musculoskeletal: Normal range of motion. He exhibits no edema or deformity.       Right lower leg: Normal. He exhibits no tenderness and no edema.       Left lower leg: Normal. He exhibits no tenderness and no edema.  Moving all  extremities, no midline spinal tenderness  Neurological: He is alert and oriented to person, place, and time. Coordination normal.  Speech is clear, able to follow commands CN III-XII intact Normal strength in upper and lower extremities bilaterally including dorsiflexion and plantar flexion, strong and equal grip strength Sensation normal to light and sharp touch Moves extremities without ataxia, coordination intact  Skin: Skin is warm and dry. Capillary refill takes less than 2 seconds. He is not diaphoretic.  Psychiatric: He has a normal mood and affect. His behavior is normal.  Nursing note and vitals reviewed.    ED Treatments / Results  Labs (all labs ordered are listed, but only abnormal results are displayed) Labs Reviewed  CBC - Abnormal; Notable for the following components:      Result Value   WBC 14.6 (*)  All other components within normal limits  BASIC METABOLIC PANEL  HEPATIC FUNCTION PANEL  LIPASE, BLOOD  URINALYSIS, ROUTINE W REFLEX MICROSCOPIC  I-STAT TROPONIN, ED    EKG EKG Interpretation  Date/Time:  Thursday December 14 2017 21:24:44 EDT Ventricular Rate:  98 PR Interval:  146 QRS Duration: 82 QT Interval:  338 QTC Calculation: 431 R Axis:   22 Text Interpretation:  Normal sinus rhythm Normal ECG Confirmed by Raeford Razor [phone]) on 12/14/2017 11:51:25 PM   Radiology Dg Chest 2 View  Result Date: 12/14/2017 CLINICAL DATA:  Chest pain EXAM: CHEST - 2 VIEW COMPARISON:  08/22/2017 FINDINGS: Heart and mediastinal contours are within normal limits. No focal opacities or effusions. No acute bony abnormality. IMPRESSION: No active cardiopulmonary disease. Electronically Signed   By: Charlett Nose M.D.   On: 12/14/2017 21:50   Ct Renal Stone Study  Result Date: 12/14/2017 CLINICAL DATA:  Right flank pain EXAM: CT ABDOMEN AND PELVIS WITHOUT CONTRAST TECHNIQUE: Multidetector CT imaging of the abdomen and pelvis was performed following the standard protocol  without IV contrast. COMPARISON:  03/14/2017 FINDINGS: Lower chest: Lung bases are clear. No effusions. Heart is normal size. Hepatobiliary: No focal hepatic abnormality. Gallbladder unremarkable. Pancreas: No focal abnormality or ductal dilatation. Spleen: No focal abnormality.  Normal size. Adrenals/Urinary Tract: No adrenal abnormality. No focal renal abnormality. No stones or hydronephrosis. Urinary bladder is unremarkable. Stomach/Bowel: Normal appendix. Stomach, large and small bowel grossly unremarkable. Vascular/Lymphatic: No evidence of aneurysm or adenopathy. Reproductive: No visible focal abnormality. Other: No free fluid or free air. There is stranding within the right buttock subcutaneous soft tissues with slight overlying skin thickening. Question cellulitis in this area. No drainable fluid collection. Musculoskeletal: No acute bony abnormality. IMPRESSION: No renal or ureteral stones.  No hydronephrosis. Normal appendix. Subcutaneous stranding within the right buttock soft tissues with overlying skin thickening, question cellulitis. Electronically Signed   By: Charlett Nose M.D.   On: 12/14/2017 23:20    Procedures .Marland KitchenIncision and Drainage Date/Time: 12/16/2017 1:40 AM Performed by: Dartha Lodge, PA-C Authorized by: Dartha Lodge, PA-C   Consent:    Consent obtained:  Verbal   Consent given by:  Patient   Risks discussed:  Bleeding, damage to other organs, incomplete drainage and pain   Alternatives discussed:  No treatment Location:    Type:  Abscess   Size:  2 cm   Location:  Anogenital   Anogenital location: R buttock. Pre-procedure details:    Skin preparation:  Chloraprep Anesthesia (see MAR for exact dosages):    Anesthesia method:  Local infiltration   Local anesthetic:  Lidocaine 2% WITH epi Procedure type:    Complexity:  Simple Procedure details:    Needle aspiration: no     Incision types:  Single straight   Incision depth:  Dermal   Wound management:  Probed  and deloculated and irrigated with saline   Drainage:  Purulent and bloody   Drainage amount:  Moderate   Packing materials:  1/4 in iodoform gauze Post-procedure details:    Patient tolerance of procedure:  Tolerated well, no immediate complications   (including critical care time)  Medications Ordered in ED Medications  sodium chloride 0.9 % bolus 1,000 mL (0 mLs Intravenous Stopped 12/15/17 0049)  ondansetron (ZOFRAN) injection 4 mg (4 mg Intravenous Given 12/14/17 2246)  morphine 4 MG/ML injection 4 mg (4 mg Intravenous Given 12/14/17 2246)  ketorolac (TORADOL) 30 MG/ML injection 30 mg (30 mg Intravenous Given 12/14/17 2246)  lidocaine-EPINEPHrine (XYLOCAINE W/EPI) 2 %-1:200000 (PF) injection 10 mL (10 mLs Infiltration Given 12/14/17 2246)     Initial Impression / Assessment and Plan / ED Course  I have reviewed the triage vital signs and the nursing notes.  Pertinent labs & imaging results that were available during my care of the patient were reviewed by me and considered in my medical decision making (see chart for details).  Pt presents with pain in right flank and lower ribs radiating towards the groin for 2 days, intermittently becomes sharp. Few episodes of emesis, no fevers or stool changes. No central chest pain, pain not worse with exertion, no shortness of breath. Pt initially mildly tachycardic, but vitals otherwise normal and pt well appearing. On exam heart RRR, lungs CTAB, tenderness over right lower ribs and right flank with mild right abdominal tenderness, neg murphys and NTTP at McBurney's. Pt's description of pain concerning for kidney stones, less likely cholelithiasis or appendicitis, could also be musculoskeletal. Very typical for ACS, no SOB, feel PE is extremely unlikely pain seems to be more abdominal than chest, although CP workup ordered from triage.  EKG shows NSR without ischemic changes, neg trop and CXR w/o active cardiopulmonary disease. Pt does have  leukocytosis of 14.6, normal hgb. No electrolyte derangements, normal renal and liver function. Normal lipase. UA without hematuria or signs of infection. Will get CT renal stone study though given presentation. IV fluids, toradol, morphine, and zofran for symptomatic management. Will plan for I&D of buttock abscess.  CT shows no evidence of renal stone or obstructive uropathy, normal appendix and no evidence of inflammation of gallbladder or dilation of CBD. Cellulitis of right buttock noted. No obvious explanation for patients symptoms, could have recently passed kidney stone. Pain improved with tx in ED, no emesis. Discussed follow up and return precautions with patient.  Patient with skin abscess amenable to incision and drainage. CT shows surrounding cellulitis in the right buttock.  Abscess in a location where it would not remain open on its own so small amount of packing placed, wife is a nurse and will remove packing in 2 days. Encouraged home warm soaks and flushing.  Given cellulitis will treat with doxycycline. Ibuprofen for pain.   At this time there does not appear to be any evidence of an acute emergency medical condition and the patient appears stable for discharge with appropriate outpatient follow up.Diagnosis was discussed with patient who verbalizes understanding and is agreeable to discharge. Pt case discussed with Dr. Juleen ChinaKohut who agrees with my plan.    Final Clinical Impressions(s) / ED Diagnoses   Final diagnoses:  Atypical chest pain  Right flank pain  Cellulitis and abscess of buttock    ED Discharge Orders         Ordered    doxycycline (VIBRAMYCIN) 100 MG capsule  2 times daily     12/15/17 0032           Dartha LodgeFord, Amaira Safley N, PA-C 12/16/17 0254    Raeford RazorKohut, Stephen, MD 12/16/17 1120

## 2017-12-14 NOTE — ED Notes (Signed)
ekg handed to Dr. Kohut 

## 2017-12-15 MED ORDER — DOXYCYCLINE HYCLATE 100 MG PO CAPS: 100.0000 mg | ORAL_CAPSULE | Freq: Two times a day (BID) | ORAL | 0 refills | Status: DC

## 2017-12-15 NOTE — Discharge Instructions (Addendum)
Work-up regarding your right flank pain is reassuring, chest x-ray looks good and CT shows normal appendix and no evidence of kidney stone.  Labs overall look good.  You may use ibuprofen and Tylenol as needed for pain.  Abscess on your right buttock was drained, there is packing in place which will need to be removed in 10 days.  Use warm soaks 2-3 times a day to help promote healing and drainage.  Take antibiotics as directed as CT scan showed decent amount of surrounding cellulitis.  Follow-up with your primary care doctor if right flank pain

## 2018-05-26 ENCOUNTER — Encounter (HOSPITAL_COMMUNITY): Payer: Self-pay

## 2018-05-26 ENCOUNTER — Emergency Department (HOSPITAL_COMMUNITY): Payer: 59

## 2018-05-26 ENCOUNTER — Emergency Department (HOSPITAL_COMMUNITY)
Admission: EM | Admit: 2018-05-26 | Discharge: 2018-05-26 | Disposition: A | Discharge status: Home / Self Care | Payer: 59 | Attending: Emergency Medicine | Admitting: Emergency Medicine

## 2018-05-26 ENCOUNTER — Other Ambulatory Visit: Payer: Self-pay

## 2018-05-26 DIAGNOSIS — S161XXA Strain of muscle, fascia and tendon at neck level, initial encounter: Secondary | ICD-10-CM | POA: Insufficient documentation

## 2018-05-26 DIAGNOSIS — R0902 Hypoxemia: Secondary | ICD-10-CM | POA: Diagnosis not present

## 2018-05-26 DIAGNOSIS — S4992XA Unspecified injury of left shoulder and upper arm, initial encounter: Secondary | ICD-10-CM | POA: Diagnosis not present

## 2018-05-26 DIAGNOSIS — S299XXA Unspecified injury of thorax, initial encounter: Secondary | ICD-10-CM | POA: Diagnosis not present

## 2018-05-26 DIAGNOSIS — Z79899 Other long term (current) drug therapy: Secondary | ICD-10-CM | POA: Insufficient documentation

## 2018-05-26 DIAGNOSIS — S40012A Contusion of left shoulder, initial encounter: Secondary | ICD-10-CM | POA: Insufficient documentation

## 2018-05-26 DIAGNOSIS — F17210 Nicotine dependence, cigarettes, uncomplicated: Secondary | ICD-10-CM | POA: Insufficient documentation

## 2018-05-26 DIAGNOSIS — S199XXA Unspecified injury of neck, initial encounter: Secondary | ICD-10-CM | POA: Diagnosis not present

## 2018-05-26 DIAGNOSIS — J45909 Unspecified asthma, uncomplicated: Secondary | ICD-10-CM | POA: Diagnosis not present

## 2018-05-26 DIAGNOSIS — I10 Essential (primary) hypertension: Secondary | ICD-10-CM | POA: Insufficient documentation

## 2018-05-26 DIAGNOSIS — M542 Cervicalgia: Secondary | ICD-10-CM | POA: Diagnosis not present

## 2018-05-26 DIAGNOSIS — Y9389 Activity, other specified: Secondary | ICD-10-CM | POA: Diagnosis not present

## 2018-05-26 DIAGNOSIS — Y998 Other external cause status: Secondary | ICD-10-CM | POA: Diagnosis not present

## 2018-05-26 DIAGNOSIS — R52 Pain, unspecified: Secondary | ICD-10-CM | POA: Diagnosis not present

## 2018-05-26 DIAGNOSIS — Y92410 Unspecified street and highway as the place of occurrence of the external cause: Secondary | ICD-10-CM | POA: Insufficient documentation

## 2018-05-26 DIAGNOSIS — Z91010 Allergy to peanuts: Secondary | ICD-10-CM | POA: Insufficient documentation

## 2018-05-26 DIAGNOSIS — M25512 Pain in left shoulder: Secondary | ICD-10-CM | POA: Diagnosis not present

## 2018-05-26 MED ORDER — IBUPROFEN 800 MG PO TABS: 800.0000 mg | ORAL_TABLET | Freq: Three times a day (TID) | ORAL | 0 refills | Status: DC | PRN

## 2018-05-26 NOTE — Discharge Instructions (Addendum)
Follow-up with your family doctor or Dr. Romeo Apple as needed
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
## 2018-05-28 ENCOUNTER — Telehealth: Payer: Self-pay | Admitting: Family Medicine

## 2018-05-28 ENCOUNTER — Telehealth: Payer: Self-pay | Admitting: Internal Medicine

## 2018-05-28 ENCOUNTER — Telehealth: Payer: Self-pay | Admitting: Orthopedic Surgery

## 2018-05-28 NOTE — Telephone Encounter (Signed)
Please advise. Thank you

## 2018-05-28 NOTE — Telephone Encounter (Signed)
Spoke with the pt and he was transferred up front to set up.

## 2018-05-28 NOTE — Telephone Encounter (Signed)
Put in book as excepting as new patient today.

## 2018-05-28 NOTE — Telephone Encounter (Signed)
Ok, yes
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
## 2018-05-28 NOTE — Telephone Encounter (Signed)
ok 

## 2018-05-28 NOTE — Telephone Encounter (Signed)
error 

## 2018-05-28 NOTE — Telephone Encounter (Signed)
Pt's wife is Draycen Weishaupt [place]) her and daughter are pts of Dr. Brett Canales. She is calling in to see if Dr. Brett Canales would be able to take him on as a pt. They have [employer] ins

## 2018-05-31 ENCOUNTER — Ambulatory Visit: Payer: 59 | Admitting: Family Medicine

## 2018-05-31 ENCOUNTER — Encounter: Payer: Self-pay | Admitting: Family Medicine

## 2018-05-31 VITALS — BP 128/88 | Ht 74.0 in | Wt 189.6 lb

## 2018-05-31 DIAGNOSIS — M25512 Pain in left shoulder: Secondary | ICD-10-CM | POA: Diagnosis not present

## 2018-05-31 DIAGNOSIS — S4992XA Unspecified injury of left shoulder and upper arm, initial encounter: Secondary | ICD-10-CM

## 2018-05-31 MED ORDER — PROPRANOLOL HCL 20 MG PO TABS: 20.0000 mg | ORAL_TABLET | Freq: Two times a day (BID) | ORAL | 5 refills | Status: DC
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
## 2018-06-06 ENCOUNTER — Ambulatory Visit (HOSPITAL_COMMUNITY)
Admission: RE | Admit: 2018-06-06 | Discharge: 2018-06-06 | Disposition: A | Discharge status: Home / Self Care | Payer: 59 | Source: Ambulatory Visit | Attending: Family Medicine | Admitting: Family Medicine

## 2018-06-06 DIAGNOSIS — M25512 Pain in left shoulder: Secondary | ICD-10-CM | POA: Diagnosis not present

## 2018-06-06 DIAGNOSIS — S4992XA Unspecified injury of left shoulder and upper arm, initial encounter: Secondary | ICD-10-CM | POA: Insufficient documentation

## 2018-06-07 ENCOUNTER — Encounter: Payer: Self-pay | Admitting: Family Medicine

## 2018-06-14 ENCOUNTER — Encounter: Payer: Self-pay | Admitting: Orthopaedic Surgery

## 2018-06-14 ENCOUNTER — Ambulatory Visit: Payer: 59 | Admitting: Orthopaedic Surgery

## 2018-06-14 VITALS — BP 124/81 | HR 73 | Ht 72.0 in | Wt 184.0 lb

## 2018-06-14 DIAGNOSIS — M792 Neuralgia and neuritis, unspecified: Secondary | ICD-10-CM

## 2018-06-14 DIAGNOSIS — M25512 Pain in left shoulder: Secondary | ICD-10-CM | POA: Diagnosis not present

## 2018-06-14 DIAGNOSIS — M542 Cervicalgia: Secondary | ICD-10-CM

## 2018-06-14 MED ORDER — HYDROCODONE-ACETAMINOPHEN 7.5-325 MG PO TABS: 1.0000 | ORAL_TABLET | ORAL | 0 refills | Status: AC | PRN

## 2018-06-14 MED ORDER — CYCLOBENZAPRINE HCL 10 MG PO TABS: 10.0000 mg | ORAL_TABLET | Freq: Every day | ORAL | 0 refills | Status: DC

## 2018-06-14 NOTE — Patient Instructions (Signed)
Out of work  Do shoulder exercises as discussed.  Call if any problem.
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
## 2018-06-20 ENCOUNTER — Ambulatory Visit (HOSPITAL_COMMUNITY)
Admission: RE | Admit: 2018-06-20 | Discharge: 2018-06-20 | Disposition: A | Discharge status: Home / Self Care | Payer: 59 | Source: Ambulatory Visit | Attending: Orthopaedic Surgery | Admitting: Orthopaedic Surgery

## 2018-06-20 DIAGNOSIS — M50222 Other cervical disc displacement at C5-C6 level: Secondary | ICD-10-CM | POA: Diagnosis not present

## 2018-06-20 DIAGNOSIS — M792 Neuralgia and neuritis, unspecified: Secondary | ICD-10-CM | POA: Diagnosis not present

## 2018-06-20 DIAGNOSIS — M50223 Other cervical disc displacement at C6-C7 level: Secondary | ICD-10-CM | POA: Diagnosis not present

## 2018-06-21 ENCOUNTER — Encounter: Payer: Self-pay | Admitting: Orthopaedic Surgery

## 2018-06-21 ENCOUNTER — Encounter: Payer: Self-pay | Admitting: Family Medicine

## 2018-06-21 ENCOUNTER — Other Ambulatory Visit: Payer: Self-pay

## 2018-06-21 ENCOUNTER — Ambulatory Visit: Payer: 59 | Admitting: Family Medicine

## 2018-06-21 ENCOUNTER — Ambulatory Visit (INDEPENDENT_AMBULATORY_CARE_PROVIDER_SITE_OTHER): Payer: 59 | Admitting: Orthopaedic Surgery

## 2018-06-21 VITALS — BP 120/82 | Ht 74.0 in | Wt 185.0 lb

## 2018-06-21 VITALS — BP 122/72 | HR 68 | Ht 73.0 in | Wt 183.0 lb

## 2018-06-21 DIAGNOSIS — M25512 Pain in left shoulder: Secondary | ICD-10-CM

## 2018-06-21 DIAGNOSIS — M792 Neuralgia and neuritis, unspecified: Secondary | ICD-10-CM

## 2018-06-21 DIAGNOSIS — M542 Cervicalgia: Secondary | ICD-10-CM | POA: Diagnosis not present

## 2018-06-21 DIAGNOSIS — S161XXD Strain of muscle, fascia and tendon at neck level, subsequent encounter: Secondary | ICD-10-CM

## 2018-06-21 MED ORDER — HYDROCODONE-ACETAMINOPHEN 7.5-325 MG PO TABS: 1.0000 | ORAL_TABLET | ORAL | 0 refills | Status: DC | PRN
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
## 2018-06-21 NOTE — Progress Notes (Signed)
Patient Eric Lowery, male DOB:December 10, 1985, 33 y.o. QTM:[id number]  Chief Complaint  Patient presents with  . Arm Pain    left   . Results    review MRI cervical spine     HPI  Eric Lowery is a 33 y.o. male who has left shoulder pain and weakness.  He had a MRI of the neck and it showed: IMPRESSION: Stable and essentially negative motion degraded cervical MRI. No impingement to explain left arm symptoms.  I have explained the findings to him.  His MRI of the shoulder was negative as well.  He continues to use his sling and has pain and weakness.  He is going to [place] to visit his brother who he has not seen in three years.  He will be back the end of this month.    I have gone over exercises for him to do while out of town.  He can use the sling but try to come out of it more and more.  I will call in pain medicine to last the time he is out of town.  He is to remain out of work.  He had an EMG scheduled and this will have to be changed.    Body mass index is 24.14 kg/m.  ROS  Review of Systems  Constitutional: Positive for activity change.  Musculoskeletal: Positive for arthralgias, myalgias and neck pain.  Neurological: Positive for weakness.  All other systems reviewed and are negative.   All other systems reviewed and are negative.  The following is a summary of the past history medically, past history surgically, known current medicines, social history and family history.  This information is gathered electronically by the computer from prior information and documentation.  I review this each visit and have found including this information at this point in the chart is beneficial and informative.    Past Medical History:  Diagnosis Date  . Asthma   . Chronic back pain   . Chronic neck pain   . GERD (gastroesophageal reflux disease)   . Hypertension     Past Surgical History:  Procedure Laterality Date  . HERNIA REPAIR    . thumb surgery       Family History  Problem Relation Age of Onset  . Hypertension Mother   . Seizures Mother   . Cancer Brother     Social History Social History   Tobacco Use  . Smoking status: Former Smoker    Packs/day: 0.50    Types: Cigarettes    Last attempt to quit: 04/11/2018    Years since quitting: 0.1  . Smokeless tobacco: Never Used  Substance Use Topics  . Alcohol use: No    Comment: occ  . Drug use: No    Allergies  Allergen Reactions  . Peanuts [Nuts] Anaphylaxis  . Penicillins Anaphylaxis    Has patient had a PCN reaction causing immediate rash, facial/tongue/throat swelling, SOB or lightheadedness with hypotension: Yes Has patient had a PCN reaction causing severe rash involving mucus membranes or skin necrosis: No Has patient had a PCN reaction that required hospitalization unknown Has patient had a PCN reaction occurring within the last 10 years: No If all of the above answers are "NO", then may proceed with Cephalosporin use.   . Shellfish Allergy Anaphylaxis  . Flexeril [Cyclobenzaprine Hcl] Other (See Comments)    Irregular heartbeat, rash    Current Outpatient Medications  Medication Sig Dispense Refill  . albuterol (PROVENTIL HFA;VENTOLIN HFA) 108 (90  BASE) MCG/ACT inhaler Inhale 1-2 puffs into the lungs every 6 (six) hours as needed for wheezing or shortness of breath. 1 Inhaler 1  . cyclobenzaprine (FLEXERIL) 10 MG tablet Take 1 tablet (10 mg total) by mouth at bedtime. One tablet every night at bedtime as needed for spasm. 30 tablet 0  . ibuprofen (ADVIL,MOTRIN) 800 MG tablet Take 1 tablet (800 mg total) by mouth every 8 (eight) hours as needed for moderate pain. 21 tablet 0  . propranolol (INDERAL) 20 MG tablet Take 1 tablet (20 mg total) by mouth 2 (two) times daily. 60 tablet 5  . HYDROcodone-acetaminophen (NORCO) 7.5-325 MG tablet Take 1 tablet by mouth every 4 (four) hours as needed for up to 30 days for moderate pain. One every six hours as needed for  pain. 90 tablet 0   No current facility-administered medications for this visit.      Physical Exam  Blood pressure 122/72, pulse 68, height 6\' 1"  (1.854 m), weight 183 lb (83 kg).  Constitutional: overall normal hygiene, normal nutrition, well developed, normal grooming, normal body habitus. Assistive device:sling  Musculoskeletal: gait and station Limp none, muscle tone and strength are normal, no tremors or atrophy is present.  .  Neurological: coordination overall normal.  Deep tendon reflex/nerve stretch intact.  Sensation normal.  Cranial nerves II-XII intact.   Skin:   Normal overall no scars, lesions, ulcers or rashes. No psoriasis.  Psychiatric: Alert and oriented x 3.  Recent memory intact, remote memory unclear.  Normal mood and affect. Well groomed.  Good eye contact.  Cardiovascular: overall no swelling, no varicosities, no edema bilaterally, normal temperatures of the legs and arms, no clubbing, cyanosis and good capillary refill.  Lymphatic: palpation is normal.  Left shoulder is very tender and he prefers not to move it much.  Grip weaker on the left than the right.  All other systems reviewed and are negative   The patient has been educated about the nature of the problem(s) and counseled on treatment options.  The patient appeared to understand what I have discussed and is in agreement with it.  Encounter Diagnoses  Name Primary?  . Radicular pain of left upper extremity Yes  . Neck pain   . Acute pain of left shoulder     PLAN Call if any problems.  Precautions discussed.  Continue current medications.   Return to clinic 1 month   I have reviewed the [facility] Controlled Substance Reporting System web site prior to prescribing narcotic medicine for this patient.   Electronically Signed Darreld Mclean, MD 3/5/202011:14 AM

## 2018-07-03 ENCOUNTER — Ambulatory Visit (INDEPENDENT_AMBULATORY_CARE_PROVIDER_SITE_OTHER): Payer: 59 | Admitting: Physical Medicine and Rehabilitation

## 2018-07-03 ENCOUNTER — Encounter (INDEPENDENT_AMBULATORY_CARE_PROVIDER_SITE_OTHER): Payer: Self-pay | Admitting: Physical Medicine and Rehabilitation

## 2018-07-03 ENCOUNTER — Other Ambulatory Visit: Payer: Self-pay

## 2018-07-03 VITALS — Temp 98.7°F

## 2018-07-03 DIAGNOSIS — R202 Paresthesia of skin: Secondary | ICD-10-CM | POA: Diagnosis not present

## 2018-07-03 NOTE — Progress Notes (Signed)
.   .  Numeric Pain Rating Scale and Functional Assessment Average Pain 9   In the last MONTH (on 0-10 scale) has pain interfered with the following?  1. General activity like being  able to carry out your everyday physical activities such as walking, climbing stairs, carrying groceries, or moving a chair?  Rating(9)     
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
## 2018-07-03 NOTE — Procedures (Signed)
EMG & NCV Findings: Evaluation of the left median motor nerve showed reduced amplitude (4.0 mV).  All remaining nerves (as indicated in the following tables) were within normal limits.    All examined muscles (as indicated in the following table) showed no evidence of electrical instability.    Impression: Essentially NORMAL electrodiagnostic study of the left upper limb.  There is no significant electrodiagnostic evidence of nerve entrapment, brachial plexopathy or cervical radiculopathy.    As you know, purely sensory or demyelinating radiculopathies and chemical radiculitis may not be detected with this particular electrodiagnostic study.  This electrodiagnostic study cannot rule out small fiber polyneuropathy and dysesthesias from central pain sensitization syndromes such as fibromyalgia.  Myotomal referral pain from trigger points is also not excluded.   Recommendations: 1.  Follow-up with referring physician. 2.  Continue current management of symptoms, consider physical therapy evaluation and possible dry needling for myofascial pain syndrome.  ___________________________ Naaman Plummer FAAPMR Board Certified, American Board of Physical Medicine and Rehabilitation    Nerve Conduction Studies Anti Sensory Summary Table   Stim Site NR Peak (ms) Norm Peak (ms) P-T Amp (V) Norm P-T Amp Site1 Site2 Delta-P (ms) Dist (cm) Vel (m/s) Norm Vel (m/s)  Left Median Acr Palm Anti Sensory (2nd Digit)  33.6C  Wrist    3.1 <3.6 29.7 >10 Wrist Palm 1.4 0.0    Palm    1.7 <2.0 34.4         Left Radial Anti Sensory (Base 1st Digit)  33.4C  Wrist    2.0 <3.1 18.4  Wrist Base 1st Digit 2.0 0.0    Left Ulnar Anti Sensory (5th Digit)  34.1C  Wrist    3.2 <3.7 24.1 >15.0 Wrist 5th Digit 3.2 14.0 44 >38   Motor Summary Table   Stim Site NR Onset (ms) Norm Onset (ms) O-P Amp (mV) Norm O-P Amp Site1 Site2 Delta-0 (ms) Dist (cm) Vel (m/s) Norm Vel (m/s)  Left Median Motor (Abd Poll Brev)  32.9C   Wrist    3.3 <4.2 *4.0 >5 Elbow Wrist 4.1 24.0 59 >50  Elbow    7.4  4.0         Left Ulnar Motor (Abd Dig Min)  33C  Wrist    3.1 <4.2 9.5 >3 B Elbow Wrist 3.2 21.0 66 >53  B Elbow    6.3  9.1  A Elbow B Elbow 1.5 10.0 67 >53  A Elbow    7.8  8.8          EMG   Side Muscle Nerve Root Ins Act Fibs Psw Amp Dur Poly Recrt Int Dennie Bible Comment  Left 1stDorInt Ulnar C8-T1 Nml Nml Nml Nml Nml 0 Nml Nml   Left Abd Poll Brev Median C8-T1 Nml Nml Nml Nml Nml 0 Nml Nml   Left ExtDigCom   Nml Nml Nml Nml Nml 0 Nml Nml   Left Triceps Radial C6-7-8 Nml Nml Nml Nml Nml 0 Nml Nml   Left Deltoid Axillary C5-6 Nml Nml Nml Nml Nml 0 Nml Nml     Nerve Conduction Studies Anti Sensory Left/Right Comparison   Stim Site L Lat (ms) R Lat (ms) L-R Lat (ms) L Amp (V) R Amp (V) L-R Amp (%) Site1 Site2 L Vel (m/s) R Vel (m/s) L-R Vel (m/s)  Median Acr Palm Anti Sensory (2nd Digit)  33.6C  Wrist 3.1   29.7   Wrist Palm     Palm 1.7   34.4  Radial Anti Sensory (Base 1st Digit)  33.4C  Wrist 2.0   18.4   Wrist Base 1st Digit     Ulnar Anti Sensory (5th Digit)  34.1C  Wrist 3.2   24.1   Wrist 5th Digit 44     Motor Left/Right Comparison   Stim Site L Lat (ms) R Lat (ms) L-R Lat (ms) L Amp (mV) R Amp (mV) L-R Amp (%) Site1 Site2 L Vel (m/s) R Vel (m/s) L-R Vel (m/s)  Median Motor (Abd Poll Brev)  32.9C  Wrist 3.3   *4.0   Elbow Wrist 59    Elbow 7.4   4.0         Ulnar Motor (Abd Dig Min)  33C  Wrist 3.1   9.5   B Elbow Wrist 66    B Elbow 6.3   9.1   A Elbow B Elbow 67    A Elbow 7.8   8.8            Waveforms:

## 2018-07-19 ENCOUNTER — Encounter: Payer: Self-pay | Admitting: Orthopaedic Surgery

## 2018-07-19 ENCOUNTER — Other Ambulatory Visit: Payer: Self-pay

## 2018-07-19 ENCOUNTER — Ambulatory Visit (INDEPENDENT_AMBULATORY_CARE_PROVIDER_SITE_OTHER): Payer: 59 | Admitting: Orthopaedic Surgery

## 2018-07-19 VITALS — BP 119/76 | HR 80 | Ht 73.0 in | Wt 183.0 lb

## 2018-07-19 DIAGNOSIS — M792 Neuralgia and neuritis, unspecified: Secondary | ICD-10-CM

## 2018-07-19 DIAGNOSIS — M542 Cervicalgia: Secondary | ICD-10-CM

## 2018-07-19 MED ORDER — HYDROCODONE-ACETAMINOPHEN 7.5-325 MG PO TABS: 1.0000 | ORAL_TABLET | ORAL | 0 refills | Status: DC | PRN
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
## 2018-08-16 ENCOUNTER — Ambulatory Visit (INDEPENDENT_AMBULATORY_CARE_PROVIDER_SITE_OTHER): Payer: 59 | Admitting: Orthopaedic Surgery

## 2018-08-16 ENCOUNTER — Other Ambulatory Visit: Payer: Self-pay

## 2018-08-16 ENCOUNTER — Encounter: Payer: Self-pay | Admitting: Orthopaedic Surgery

## 2018-08-16 DIAGNOSIS — M542 Cervicalgia: Secondary | ICD-10-CM | POA: Diagnosis not present

## 2018-08-16 DIAGNOSIS — M792 Neuralgia and neuritis, unspecified: Secondary | ICD-10-CM

## 2018-08-16 MED ORDER — HYDROCODONE-ACETAMINOPHEN 7.5-325 MG PO TABS: 1.0000 | ORAL_TABLET | ORAL | 0 refills | Status: DC | PRN

## 2018-08-16 NOTE — Progress Notes (Signed)
Virtual Visit via Telephone Note  I connected with Eric Lowery on 08/16/18 at  9:30 AM EDT by telephone and verified that I am speaking with the correct person using two identifiers.   I discussed the limitations, risks, security and privacy concerns of performing an evaluation and management service by telephone and the availability of in person appointments. I also discussed with the patient that there may be a patient responsible charge related to this service. The patient expressed understanding and agreed to proceed.   History of Present Illness: He has continued pain of his neck and the left shoulder and upper arm area but it is a little less.  He has been doing the exercises I recommended and he feels they have helped him.  He has no new trauma. He still has pain, worse some days than others.  He is taking his medicine.  He has no new trauma.  He is working.  I have recommended water therapy if the pools open later next month.   Observations/Objective: Per above  Assessment and Plan: Encounter Diagnoses  Name Primary?  . Radicular pain of left upper extremity Yes  . Neck pain      Follow Up Instructions: I have called in refill on his pain medicine.  I have reviewed the [place] Controlled Substance Reporting System web site prior to prescribing narcotic medicine for this patient.   I have told him to continue his exercises.  Call if any problem.  Precautions discussed.      I discussed the assessment and treatment plan with the patient. The patient was provided an opportunity to ask questions and all were answered. The patient agreed with the plan and demonstrated an understanding of the instructions.   The patient was advised to call back or seek an in-person evaluation if the symptoms worsen or if the condition fails to improve as anticipated.  I provided 12 minutes of non-face-to-face time during this encounter.   Darreld Mclean, MD

## 2018-09-13 ENCOUNTER — Encounter: Payer: Self-pay | Admitting: Orthopaedic Surgery

## 2018-09-13 ENCOUNTER — Other Ambulatory Visit: Payer: Self-pay

## 2018-09-13 ENCOUNTER — Ambulatory Visit (INDEPENDENT_AMBULATORY_CARE_PROVIDER_SITE_OTHER): Payer: 59

## 2018-09-13 ENCOUNTER — Telehealth: Payer: Self-pay | Admitting: Orthopaedic Surgery

## 2018-09-13 ENCOUNTER — Ambulatory Visit (INDEPENDENT_AMBULATORY_CARE_PROVIDER_SITE_OTHER): Payer: 59 | Admitting: Orthopaedic Surgery

## 2018-09-13 VITALS — BP 127/85 | HR 78 | Temp 97.4°F | Ht 73.0 in | Wt 195.0 lb

## 2018-09-13 DIAGNOSIS — M25512 Pain in left shoulder: Secondary | ICD-10-CM

## 2018-09-13 MED ORDER — HYDROCODONE-ACETAMINOPHEN 7.5-325 MG PO TABS: 1.0000 | ORAL_TABLET | ORAL | 0 refills | Status: DC | PRN

## 2018-09-13 NOTE — Telephone Encounter (Signed)
No additional. Work note issued
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
## 2018-09-13 NOTE — Patient Instructions (Signed)
OUT OF WORK ?

## 2018-09-14 ENCOUNTER — Encounter (HOSPITAL_COMMUNITY): Payer: Self-pay | Admitting: Occupational Therapy

## 2018-09-14 ENCOUNTER — Ambulatory Visit (HOSPITAL_COMMUNITY): Payer: 59 | Attending: Orthopaedic Surgery | Admitting: Occupational Therapy

## 2018-09-14 DIAGNOSIS — M25512 Pain in left shoulder: Secondary | ICD-10-CM | POA: Diagnosis not present

## 2018-09-14 DIAGNOSIS — M542 Cervicalgia: Secondary | ICD-10-CM | POA: Insufficient documentation

## 2018-09-14 DIAGNOSIS — M25612 Stiffness of left shoulder, not elsewhere classified: Secondary | ICD-10-CM | POA: Insufficient documentation

## 2018-09-14 DIAGNOSIS — R29898 Other symptoms and signs involving the musculoskeletal system: Secondary | ICD-10-CM | POA: Insufficient documentation
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
## 2018-09-14 NOTE — Patient Instructions (Signed)
SHOULDER: Flexion On Table   Place hands on towel placed on table, elbows straight. Lean forward with you upper body, pushing towel away from body.  _15__ reps per set, _2-3__ sets per day  Abduction (Passive)   With arm out to side, resting on towel placed on table, keeping trunk away from table, lean to the side while pushing towel away from body.  Repeat _15___ times. Do _2-3___ sessions per day.  Copyright  VHI. All rights reserved.     Internal Rotation (Assistive)   Seated with elbow bent at right angle and held against side, slide arm on table surface in an inward arc keeping elbow anchored in place. Repeat _15___ times. Do _2-3___ sessions per day. Activity: Use this motion to brush crumbs off the table.  Copyright  VHI. All rights reserved.     1) Seated Row   Sit up straight with elbows by your sides. Pull back with shoulders/elbows, keeping forearms straight, as if pulling back on the reins of a horse. Squeeze shoulder blades together. Repeat _15__times, _2-3___sets/day    2) Shoulder Elevation    Sit up straight with arms by your sides. Slowly bring your shoulders up towards your ears. Repeat_15__times, _2-3___ sets/day    3) Shoulder Extension    Sit up straight with both arms by your side, draw your arms back behind your waist. Keep your elbows straight. Repeat __15__times, __2-3__sets/day.       AROM: Lateral Neck Flexion   Slowly tilt head toward one shoulder, then the other.  Repeat _10___ times per set. Do __1__ sets per session. Do __2-3__ sessions per day.  http://orth.exer.us/296   Copyright  VHI. All rights reserved.  AROM: Neck Extension   Bend head backward.  Repeat __10__ times per set. Do _1___ sets per session. Do __2-3__ sessions per day.  http://orth.exer.us/300   Copyright  VHI. All rights reserved.  AROM: Neck Flexion   Bend head forward.  Repeat __10__ times per set. Do _1___ sets per session. Do __2-3__  sessions per day.  http://orth.exer.us/298   Copyright  VHI. All rights reserved.  AROM: Neck Rotation   Turn head slowly to look over one shoulder, then the other.  Repeat __10__ times per set. Do __1__ sets per session. Do __2-3__ sessions per day.  http://orth.exer.us/294   Copyright  VHI. All rights reserved.

## 2018-09-17 ENCOUNTER — Other Ambulatory Visit: Payer: Self-pay

## 2018-09-17 ENCOUNTER — Encounter (HOSPITAL_COMMUNITY): Payer: Self-pay

## 2018-09-17 ENCOUNTER — Ambulatory Visit (HOSPITAL_COMMUNITY): Payer: 59 | Attending: Orthopaedic Surgery

## 2018-09-17 DIAGNOSIS — M542 Cervicalgia: Secondary | ICD-10-CM | POA: Insufficient documentation

## 2018-09-17 DIAGNOSIS — M25512 Pain in left shoulder: Secondary | ICD-10-CM | POA: Diagnosis not present

## 2018-09-17 DIAGNOSIS — M25612 Stiffness of left shoulder, not elsewhere classified: Secondary | ICD-10-CM | POA: Insufficient documentation

## 2018-09-17 DIAGNOSIS — R29898 Other symptoms and signs involving the musculoskeletal system: Secondary | ICD-10-CM | POA: Diagnosis not present

## 2018-09-17 NOTE — Therapy (Signed)
[place] [facility] [address] [place], [place], [address] Phone: [phone]   Fax:  [phone]  Occupational Therapy Treatment  Patient Details  Name: Eric Lowery MRN: [id number] Date of Birth: 06-14-1985 Referring Provider (OT): Dr. Darreld Mclean   Encounter Date: 09/17/2018  OT End of Session - 09/17/18 1021    Visit Number  2    Number of Visits  12    Date for OT Re-Evaluation  10/26/18    Authorization Type  UMR    OT Start Time  0935    OT Stop Time  1022    OT Time Calculation (min)  47 min    Activity Tolerance  Patient tolerated treatment well    Behavior During Therapy  [facility] for tasks assessed/performed       Past Medical History:  Diagnosis Date  . Asthma   . Chronic back pain   . Chronic neck pain   . GERD (gastroesophageal reflux disease)   . Hypertension     Past Surgical History:  Procedure Laterality Date  . HERNIA REPAIR    . thumb surgery      There were no vitals filed for this visit.  Subjective Assessment - 09/17/18 1016    Subjective   S: It hurts more if I'm using it.     Currently in Pain?  Yes    Pain Score  8     Pain Location  Shoulder    Pain Orientation  Left    Pain Descriptors / Indicators  Aching;Throbbing    Pain Type  Acute pain         [facility] OT Assessment - 09/17/18 1017      Assessment   Medical Diagnosis  left shoulder pain      Precautions   Precautions  None      Observation/Other Assessments   Focus on Therapeutic Outcomes (FOTO)   44/100               OT Treatments/Exercises (OP) - 09/17/18 1017      Exercises   Exercises  Shoulder      Shoulder Exercises: Supine   Protraction  PROM;5 reps    Horizontal ABduction  PROM;5 reps    External Rotation  PROM;5 reps    Internal Rotation  PROM;5 reps    Flexion  PROM;5 reps    ABduction  PROM;5 reps      Shoulder Exercises: Therapy Ball   Flexion  Left;10 reps    ABduction  Left;10 reps      Shoulder  Exercises: Isometric Strengthening   Flexion  Supine;3X5"    Extension  Supine;3X5"    External Rotation  Supine;3X5"    Internal Rotation  Supine;3X5"    ABduction  Supine;3X5"    ADduction  Supine;3X5"      Modalities   Modalities  Electrical Stimulation;Moist Heat      Moist Heat Therapy   Number Minutes Moist Heat  5 Minutes    Moist Heat Location  Shoulder      Electrical Stimulation   Electrical Stimulation Location  left shoulder/upper trapezius    Electrical Stimulation Action  interferential    Electrical Stimulation Parameters  9.0 CV    Electrical Stimulation Goals  Pain      Manual Therapy   Manual Therapy  Soft tissue mobilization    Manual therapy comments  Manual therapy completed prior to exercises.     Soft tissue mobilization  Myofascial  release and manual stretching completed to left upper arm, trapezius, and scapularis region to decrease fascial restrictions and increase joint mobility in a pain free zone.              OT Education - 09/17/18 1020    Education Details  Patient educated on self trigger point release with ball. ES use with handout provided for purchase.     Person(s) Educated  Patient    Methods  Explanation;Demonstration;Handout;Verbal cues    Comprehension  Verbalized understanding;Returned demonstration       OT Short Term Goals - 09/17/18 1022      OT SHORT TERM GOAL #1   Title  Pt will be provided with HEP to improve mobility required for LUE use as non-dominant during ADL completion.     Time  3    Period  Weeks    Status  On-going    Target Date  10/05/18      OT SHORT TERM GOAL #2   Title  Pt will decrease LUE pain to 4/10 or less to improve ability to sleep without using specilized pillows.     Time  3    Period  Weeks    Status  On-going      OT SHORT TERM GOAL #3   Title  Pt will increase LUE P/ROM to WNL to improve ability to perform dressing tasks with minimal to no compensatory strategies.     Time  3     Period  Weeks    Status  On-going      OT SHORT TERM GOAL #4   Title  Pt will increase LUE strength to 4-/5 throughout to improve ability to reach above shoulder level and behind back when performing ADLs.     Time  3    Period  Weeks    Status  On-going      OT SHORT TERM GOAL #5   Title  Pt will improve cervical neck [facility] to improve ability to turn head when driving.     Time  3    Period  Weeks    Status  On-going        OT Long Term Goals - 09/17/18 1022      OT LONG TERM GOAL #1   Title  Pt will return to highest level of functioning using LUE as non-dominant during ADL and work tasks.     Time  6    Period  Weeks    Status  On-going      OT LONG TERM GOAL #2   Title  Pt will decrease LUE fascial restrictions to minimal amounts or less to improve mobility required for functional reaching tasks.     Time  6    Period  Weeks    Status  On-going      OT LONG TERM GOAL #3   Title  Pt will increase LUE A/ROM to WNL to increase ability to perform overhead reaching tasks at home and work.     Time  6    Period  Weeks    Status  On-going      OT LONG TERM GOAL #4   Title  Pt will increase LUE strength to 5/5 to improve ability to perform work tasks-using equipment, Warden/rangerinstalling items, etc.     Time  6    Period  Weeks    Status  On-going      OT LONG TERM GOAL #5  Title  Pt will verbalize understanding of and implement postural education to reduce neck pain and improve mobility of neck and shoulder during ADLs.     Time  6    Period  Weeks    Status  On-going            Plan - 09/17/18 1048    Clinical Impression Statement  A: Initiated myofascial release and manual stretching, therapy ball stretch, and isometric shoulder exercises. Patient was educated on self trigger point release. Trialed ES at end of session and patient reports a decrease in pain (2-3/10).     Body Structure / Function / Physical Skills  ADL;Strength;Pain;Body mechanics;UE functional  use;IADL;ROM;Fascial restriction;Flexibility    Plan  P: Add seated extension and row, and PVC pipe slide if able to tolerate.        Patient will benefit from skilled therapeutic intervention in order to improve the following deficits and impairments:  Body Structure / Function / Physical Skills  Visit Diagnosis: Acute pain of left shoulder  Cervicalgia  Stiffness of left shoulder, not elsewhere classified  Other symptoms and signs involving the musculoskeletal system    Problem List There are no active problems to display for this patient.  Limmie Patricia, OTR/L,CBIS  [phone]  09/17/2018, 10:53 AM  [facility] [address] [place], [place], [address] Phone: [phone]   Fax:  [phone]  Name: Eric Lowery MRN: [id number] Date of Birth: 08/16/1985

## 2018-09-19 ENCOUNTER — Ambulatory Visit (HOSPITAL_COMMUNITY): Payer: 59

## 2018-09-26 ENCOUNTER — Telehealth (HOSPITAL_COMMUNITY): Payer: Self-pay | Admitting: Family Medicine

## 2018-09-26 ENCOUNTER — Ambulatory Visit (HOSPITAL_COMMUNITY): Payer: 59 | Admitting: Occupational Therapy

## 2018-09-26 NOTE — Telephone Encounter (Signed)
09/26/18  pt called to cancel and wanted to reschedule for Friday but there was nothing on the OT schedule... will add to end of his schedul

## 2018-09-27 ENCOUNTER — Other Ambulatory Visit: Payer: Self-pay

## 2018-09-27 ENCOUNTER — Ambulatory Visit: Payer: 59 | Admitting: Orthopaedic Surgery

## 2018-09-27 ENCOUNTER — Encounter (HOSPITAL_COMMUNITY): Payer: Self-pay | Admitting: Occupational Therapy

## 2018-09-27 ENCOUNTER — Ambulatory Visit (HOSPITAL_COMMUNITY): Payer: 59 | Admitting: Occupational Therapy

## 2018-09-27 ENCOUNTER — Encounter: Payer: Self-pay | Admitting: Orthopaedic Surgery

## 2018-09-27 VITALS — BP 132/87 | HR 91 | Temp 97.3°F | Ht 73.0 in | Wt 195.0 lb

## 2018-09-27 DIAGNOSIS — M25612 Stiffness of left shoulder, not elsewhere classified: Secondary | ICD-10-CM

## 2018-09-27 DIAGNOSIS — M25512 Pain in left shoulder: Secondary | ICD-10-CM

## 2018-09-27 DIAGNOSIS — M542 Cervicalgia: Secondary | ICD-10-CM | POA: Diagnosis not present

## 2018-09-27 DIAGNOSIS — M792 Neuralgia and neuritis, unspecified: Secondary | ICD-10-CM | POA: Diagnosis not present

## 2018-09-27 DIAGNOSIS — R29898 Other symptoms and signs involving the musculoskeletal system: Secondary | ICD-10-CM | POA: Diagnosis not present

## 2018-09-27 MED ORDER — HYDROCODONE-ACETAMINOPHEN 7.5-325 MG PO TABS: 1.0000 | ORAL_TABLET | ORAL | 0 refills | Status: AC | PRN

## 2018-09-27 NOTE — Progress Notes (Signed)
Patient Eric Lowery, male DOB:1986-02-10, 33 y.o. IOE:[id number]  Chief Complaint  Patient presents with  . Shoulder Pain    left  . Neck Pain    HPI  Eric Lowery is a 33 y.o. male who has acute and chronic pain of the left shoulder.  He has been to OT and I have reviewed his notes.  He needs to go to [place] to visit his brother who is ill.  He will be gone about a month to six weeks.  He leaves tomorrow.  He is driving.  He has no new trauma.  He has some improved motion of the shoulder.   Body mass index is 25.73 kg/m.  ROS  Review of Systems  Constitutional: Positive for activity change.  Musculoskeletal: Positive for arthralgias, myalgias and neck pain.  Neurological: Positive for weakness.  All other systems reviewed and are negative.   All other systems reviewed and are negative.  The following is a summary of the past history medically, past history surgically, known current medicines, social history and family history.  This information is gathered electronically by the computer from prior information and documentation.  I review this each visit and have found including this information at this point in the chart is beneficial and informative.    Past Medical History:  Diagnosis Date  . Asthma   . Chronic back pain   . Chronic neck pain   . GERD (gastroesophageal reflux disease)   . Hypertension     Past Surgical History:  Procedure Laterality Date  . HERNIA REPAIR    . thumb surgery      Family History  Problem Relation Age of Onset  . Hypertension Mother   . Seizures Mother   . Cancer Brother     Social History Social History   Tobacco Use  . Smoking status: Former Smoker    Packs/day: 0.50    Types: Cigarettes    Quit date: 04/11/2018    Years since quitting: 0.4  . Smokeless tobacco: Never Used  Substance Use Topics  . Alcohol use: No    Comment: occ  . Drug use: No    Allergies  Allergen Reactions  . Peanuts [Nuts] Anaphylaxis   . Penicillins Anaphylaxis    Has patient had a PCN reaction causing immediate rash, facial/tongue/throat swelling, SOB or lightheadedness with hypotension: Yes Has patient had a PCN reaction causing severe rash involving mucus membranes or skin necrosis: No Has patient had a PCN reaction that required hospitalization unknown Has patient had a PCN reaction occurring within the last 10 years: No If all of the above answers are "NO", then may proceed with Cephalosporin use.   . Shellfish Allergy Anaphylaxis  . Flexeril [Cyclobenzaprine Hcl] Other (See Comments)    Irregular heartbeat, rash    Current Outpatient Medications  Medication Sig Dispense Refill  . albuterol (PROVENTIL HFA;VENTOLIN HFA) 108 (90 BASE) MCG/ACT inhaler Inhale 1-2 puffs into the lungs every 6 (six) hours as needed for wheezing or shortness of breath. 1 Inhaler 1  . amoxicillin (AMOXIL) 500 MG capsule     . cyclobenzaprine (FLEXERIL) 10 MG tablet Take 1 tablet (10 mg total) by mouth at bedtime. One tablet every night at bedtime as needed for spasm. 30 tablet 0  . HYDROcodone-acetaminophen (NORCO) 7.5-325 MG tablet Take 1 tablet by mouth every 4 (four) hours as needed for up to 30 days for moderate pain. One every six hours as needed for pain. 120 tablet 0  .  ibuprofen (ADVIL,MOTRIN) 800 MG tablet Take 1 tablet (800 mg total) by mouth every 8 (eight) hours as needed for moderate pain. 21 tablet 0  . propranolol (INDERAL) 20 MG tablet Take 1 tablet (20 mg total) by mouth 2 (two) times daily. 60 tablet 5   No current facility-administered medications for this visit.      Physical Exam  Blood pressure 132/87, pulse 91, temperature (!) 97.3 F (36.3 C), height 6\' 1"  (1.854 m), weight 195 lb (88.5 kg).  Constitutional: overall normal hygiene, normal nutrition, well developed, normal grooming, normal body habitus. Assistive device:none  Musculoskeletal: gait and station Limp none, muscle tone and strength are normal, no  tremors or atrophy is present.  .  Neurological: coordination overall normal.  Deep tendon reflex/nerve stretch intact.  Sensation normal.  Cranial nerves II-XII intact.   Skin:   Normal overall no scars, lesions, ulcers or rashes. No psoriasis.  Psychiatric: Alert and oriented x 3.  Recent memory intact, remote memory unclear.  Normal mood and affect. Well groomed.  Good eye contact.  Cardiovascular: overall no swelling, no varicosities, no edema bilaterally, normal temperatures of the legs and arms, no clubbing, cyanosis and good capillary refill.  Lymphatic: palpation is normal.  Examination of left Upper Extremity is done.  Inspection:   Overall:  Elbow non-tender without crepitus or defects, forearm non-tender without crepitus or defects, wrist non-tender without crepitus or defects, hand non-tender.    Shoulder: with glenohumeral joint tenderness, without effusion.   Upper arm:  without swelling and tenderness   Range of motion:   Overall:  Full range of motion of the elbow, full range of motion of wrist and full range of motion in fingers.   Shoulder:  left  95 degrees forward flexion; 80 degrees abduction; 20 degrees internal rotation, 20 degrees external rotation, 10 degrees extension, 40 degrees adduction.   Stability:   Overall:  Shoulder, elbow and wrist stable   Strength and Tone:   Overall full shoulder muscles strength, full upper arm strength and normal upper arm bulk and tone.  All other systems reviewed and are negative   The patient has been educated about the nature of the problem(s) and counseled on treatment options.  The patient appeared to understand what I have discussed and is in agreement with it.  Encounter Diagnoses  Name Primary?  . Acute pain of left shoulder Yes  . Radicular pain of left upper extremity   . Neck pain     PLAN Call if any problems.  Precautions discussed.  Continue current medications.   Return to clinic when he [place] from  [place].  PROCEDURE NOTE:  The patient request injection, verbal consent was obtained.  The left shoulder was prepped appropriately after time out was performed.   Sterile technique was observed and injection of 1 cc of Depo-Medrol 40 mg with several cc's of plain xylocaine. Anesthesia was provided by ethyl chloride and a 20-gauge needle was used to inject the shoulder area. A posterior approach was used.  The injection was tolerated well.  A band aid dressing was applied.  The patient was advised to apply ice later today and tomorrow to the injection sight as needed.    I have reviewed the [place] Controlled Substance Reporting System web site prior to prescribing narcotic medicine for this patient.   Electronically Signed Darreld McleanWayne Mathew Storck, MD 6/11/202010:34 AM

## 2018-09-27 NOTE — Therapy (Signed)
[facility] [address] [place], [place], [address] Phone: [phone]   Fax:  [phone]  Occupational Therapy Treatment  Patient Details  Name: Eric FeyDavid A Abboud MRN: [id number] Date of Birth: [birth year] Referring Provider (OT): Dr. Darreld McleanWayne Keeling   Encounter Date: 09/27/2018  OT End of Session - 09/27/18 1712    Visit Number  3    Number of Visits  12    Date for OT Re-Evaluation  10/26/18    Authorization Type  [facility]    Activity Tolerance  Patient tolerated treatment well    Behavior During Therapy  [facility] for tasks assessed/performed       Past Medical History:  Diagnosis Date  . Asthma   . Chronic back pain   . Chronic neck pain   . GERD (gastroesophageal reflux disease)   . Hypertension     Past Surgical History:  Procedure Laterality Date  . HERNIA REPAIR    . thumb surgery      There were no vitals filed for this visit.  Subjective Assessment - 09/27/18 1625    Subjective   S: I haven't been working today but my arm is still hurting.    Currently in Pain?  Yes    Pain Score  8     Pain Location  Shoulder    Pain Orientation  Left    Pain Descriptors / Indicators  Aching    Pain Type  Acute pain    Pain Radiating Towards  N/A    Pain Onset  In the past 7 days    Pain Frequency  Constant    Aggravating Factors   movement    Pain Relieving Factors  rest, tens unit    Effect of Pain on Daily Activities  mod effect on ADLs    Multiple Pain Sites  No         [facility] OT Assessment - 09/27/18 1625      Assessment   Medical Diagnosis  left shoulder pain      Precautions   Precautions  None               OT Treatments/Exercises (OP) - 09/27/18 1626      Exercises   Exercises  Shoulder      Shoulder Exercises: Supine   Protraction  PROM;AAROM;5 reps    Horizontal ABduction  PROM;AAROM;5 reps    External Rotation  PROM;AAROM;5 reps    Internal Rotation  PROM;AAROM;5 reps    Flexion  PROM;AAROM;5  reps    ABduction  PROM;AAROM;5 reps      Shoulder Exercises: Seated   Extension  AROM;10 reps    Row  AROM;10 reps      Shoulder Exercises: Isometric Strengthening   Flexion  --   standing 3x10"   Extension  --   standing 3x10"   External Rotation  --   standing 3x10"   Internal Rotation  --   standing 3x10"   ABduction  --   standing 3x10"   ADduction  --   standing 3x10"     Modalities   Modalities  Electrical Stimulation      Electrical Stimulation   Electrical Stimulation Location  left shoulder/upper trapezius    Electrical Stimulation Action  interferential    Electrical Stimulation Parameters  5.6 CV    Electrical Stimulation Goals  Pain      Manual Therapy   Manual Therapy  Soft tissue mobilization    Manual therapy  comments  Manual therapy completed prior to exercises.     Soft tissue mobilization  Myofascial release and manual stretching completed to left upper arm, trapezius, and scapularis region to decrease fascial restrictions and increase joint mobility in a pain free zone.                OT Short Term Goals - 09/17/18 1022      OT SHORT TERM GOAL #1   Title  Pt will be provided with HEP to improve mobility required for LUE use as non-dominant during ADL completion.     Time  3    Period  Weeks    Status  On-going    Target Date  10/05/18      OT SHORT TERM GOAL #2   Title  Pt will decrease LUE pain to 4/10 or less to improve ability to sleep without using specilized pillows.     Time  3    Period  Weeks    Status  On-going      OT SHORT TERM GOAL #3   Title  Pt will increase LUE P/ROM to WNL to improve ability to perform dressing tasks with minimal to no compensatory strategies.     Time  3    Period  Weeks    Status  On-going      OT SHORT TERM GOAL #4   Title  Pt will increase LUE strength to 4-/5 throughout to improve ability to reach above shoulder level and behind back when performing ADLs.     Time  3    Period  Weeks     Status  On-going      OT SHORT TERM GOAL #5   Title  Pt will improve cervical neck A/ROM to [facility] to improve ability to turn head when driving.     Time  3    Period  Weeks    Status  On-going        OT Long Term Goals - 09/17/18 1022      OT LONG TERM GOAL #1   Title  Pt will return to highest level of functioning using LUE as non-dominant during ADL and work tasks.     Time  6    Period  Weeks    Status  On-going      OT LONG TERM GOAL #2   Title  Pt will decrease LUE fascial restrictions to minimal amounts or less to improve mobility required for functional reaching tasks.     Time  6    Period  Weeks    Status  On-going      OT LONG TERM GOAL #3   Title  Pt will increase LUE A/ROM to WNL to increase ability to perform overhead reaching tasks at home and work.     Time  6    Period  Weeks    Status  On-going      OT LONG TERM GOAL #4   Title  Pt will increase LUE strength to 5/5 to improve ability to perform work tasks-using equipment, Warden/rangerinstalling items, etc.     Time  6    Period  Weeks    Status  On-going      OT LONG TERM GOAL #5   Title  Pt will verbalize understanding of and implement postural education to reduce neck pain and improve mobility of neck and shoulder during ADLs.     Time  6    Period  Weeks  Status  On-going            Plan - 09/27/18 1706    Clinical Impression Statement  A: Pt reporting continued high pain level today, had a cortisone shot earlier today. Continued with manual therapy and isometrics, added AA/ROM, scapular A/ROM this session. Pt able to achieve ROM at approximately 60-70% today. Verbal cuing for posture, form, and technique during exercises.    Body Structure / Function / Physical Skills  ADL;UE functional use;Fascial restriction;Pain;Flexibility;ROM;IADL;Strength    Plan  P: Continue manual techniques, Attempt to increase AA/ROM to 10 repetitions if pt able to tolerate, attempt pvc pipe slide       Patient will benefit  from skilled therapeutic intervention in order to improve the following deficits and impairments:  Body Structure / Function / Physical Skills  Visit Diagnosis: Acute pain of left shoulder  Stiffness of left shoulder, not elsewhere classified  Other symptoms and signs involving the musculoskeletal system    Problem List There are no active problems to display for this patient.  Guadelupe Sabin, OTR/L  [phone] 09/27/2018, 5:33 PM  [facility] [address] [place], [place], [address] Phone: [phone]   Fax:  [phone]  Name: SONU KRUCKENBERG MRN: [id number] Date of Birth: [birth year]

## 2018-10-09 ENCOUNTER — Ambulatory Visit (HOSPITAL_COMMUNITY): Payer: 59 | Admitting: Occupational Therapy

## 2018-10-11 ENCOUNTER — Ambulatory Visit (HOSPITAL_COMMUNITY): Payer: 59 | Admitting: Occupational Therapy

## 2018-10-16 ENCOUNTER — Ambulatory Visit (HOSPITAL_COMMUNITY): Payer: 59 | Admitting: Occupational Therapy

## 2018-10-18 ENCOUNTER — Telehealth (HOSPITAL_COMMUNITY): Payer: Self-pay | Admitting: Occupational Therapy

## 2018-10-18 ENCOUNTER — Ambulatory Visit (HOSPITAL_COMMUNITY): Payer: 59 | Attending: Orthopaedic Surgery | Admitting: Occupational Therapy

## 2018-10-18 NOTE — Telephone Encounter (Signed)
Left message for pt regarding 2 missed appointments this week. Reminded of next appt oin 7/7 at 11:15. Also reminded of attendance policy and asked pt to call if unable to keep appointment.    Eric Lowery, OTR/L  [phone] 10/18/2018

## 2018-10-23 ENCOUNTER — Telehealth (HOSPITAL_COMMUNITY): Payer: Self-pay | Admitting: Occupational Therapy

## 2018-10-23 ENCOUNTER — Encounter (HOSPITAL_COMMUNITY): Payer: Self-pay | Admitting: Occupational Therapy

## 2018-10-23 ENCOUNTER — Ambulatory Visit (HOSPITAL_COMMUNITY): Payer: 59 | Admitting: Occupational Therapy

## 2018-10-23 NOTE — Therapy (Signed)
[place], [place], [address] Phone: [phone]   Fax:  [phone]  Patient Details  Name: Eric Lowery MRN: [id number] Date of Birth: [birth year] Referring Provider:  No ref. provider found  Encounter Date: 10/23/2018  OCCUPATIONAL THERAPY DISCHARGE SUMMARY  Visits from Start of Care: 3  Current functional level related to goals / functional outcomes: Unknown. Pt has not returned to therapy since last visit on 09/27/2018. He has missed 4 appointments without cancellation and has not returned messages. Current functional level is unknown. He will need a new referral to return for additional therapy services.    Remaining deficits: Unknown.    Education / Equipment: HEP for table slides, scapular ROM; trigger point release, ES purchasing information Plan: Patient agrees to discharge.  Patient goals were not met. Patient is being discharged due to not returning since the last visit.  ?????       Guadelupe Sabin, OTR/L  [phone] 10/23/2018, 11:40 AM  [facility] [address] [place], [place], [address] Phone: [phone]   Fax:  [phone]

## 2018-10-23 NOTE — Telephone Encounter (Signed)
Called pt regarding no show. Referred to prior message and reminder of attendance policy. Informed pt that he will be discharged due to 4 consecutive no-shows and that he will need a new referral to return for additional therapy services.    Guadelupe Sabin, OTR/L  [phone] 10/23/2018

## 2018-10-23 NOTE — Telephone Encounter (Signed)
Called pt to inform him he was D/c today and needs a new referrral no anwser. NF Pt called back -Pt understands he has been d/c and needs to get a new referral to return to OT, per CW.

## 2018-10-25 ENCOUNTER — Ambulatory Visit (INDEPENDENT_AMBULATORY_CARE_PROVIDER_SITE_OTHER): Payer: 59 | Admitting: Orthopaedic Surgery

## 2018-10-25 ENCOUNTER — Other Ambulatory Visit: Payer: Self-pay

## 2018-10-25 ENCOUNTER — Encounter: Payer: Self-pay | Admitting: Orthopaedic Surgery

## 2018-10-25 ENCOUNTER — Encounter (HOSPITAL_COMMUNITY): Payer: 59 | Admitting: Occupational Therapy

## 2018-10-25 ENCOUNTER — Telehealth: Payer: Self-pay

## 2018-10-25 DIAGNOSIS — G8929 Other chronic pain: Secondary | ICD-10-CM

## 2018-10-25 DIAGNOSIS — M25512 Pain in left shoulder: Secondary | ICD-10-CM | POA: Diagnosis not present

## 2018-10-25 NOTE — Telephone Encounter (Signed)
Relayed message to patient as instructed.

## 2018-10-25 NOTE — Telephone Encounter (Signed)
As discussed with you on telephone, he got 120 pills on 09-27-2018.  He got more than I normally give as he said he was going to [place] to see his brother who had to have surgery.  He told me he might be gone 6 weeks.  I gave enough for 6 weeks.  He told me today the surgery went well and his brother is doing far better than expected.  He came back early.  He is not due to have medicine refill for 6 weeks from 09-27-2018.  If he thought he was due today, he is mistaken.  Please let him know.

## 2018-10-25 NOTE — Progress Notes (Signed)
PROCEDURE NOTE:  The patient request injection, verbal consent was obtained.  The left shoulder was prepped appropriately after time out was performed.   Sterile technique was observed and injection of 1 cc of Depo-Medrol 40 mg with several cc's of plain xylocaine. Anesthesia was provided by ethyl chloride and a 20-gauge needle was used to inject the shoulder area. A posterior approach was used.  The injection was tolerated well.  A band aid dressing was applied.  The patient was advised to apply ice later today and tomorrow to the injection sight as needed.  I have reviewed the [place] web site prior to prescribing narcotic medicine for this patient.   Electronically Signed Sanjuana Kava, MD 7/9/202011:03 AM

## 2018-10-25 NOTE — Telephone Encounter (Signed)
Hydrocodone-Acetaminophen 7.5/325 mg  Qty 120 Tablets ° °PATIENT USES [facility] °

## 2018-10-30 ENCOUNTER — Ambulatory Visit (HOSPITAL_COMMUNITY): Payer: 59 | Admitting: Occupational Therapy

## 2018-11-05 ENCOUNTER — Other Ambulatory Visit: Payer: Self-pay | Admitting: Orthopaedic Surgery

## 2018-11-13 DIAGNOSIS — F1120 Opioid dependence, uncomplicated: Secondary | ICD-10-CM | POA: Diagnosis not present

## 2018-11-15 ENCOUNTER — Telehealth (HOSPITAL_COMMUNITY): Payer: Self-pay | Admitting: Family Medicine

## 2018-11-15 NOTE — Telephone Encounter (Signed)
11/15/18    Left a message to schedule - if he hasn't called back by 8/6 then we will close out the referral

## 2018-11-21 ENCOUNTER — Telehealth: Payer: Self-pay | Admitting: Orthopaedic Surgery

## 2018-11-21 NOTE — Telephone Encounter (Signed)
Patient needs refill on Hydrocodone/Acetaminophen 7.5-325  Mgs.  Qty  120  Sig: Take 1 tablet by mouth every 4 (four) hours as needed for up to 30 days for moderate pain. One every six hours as needed for pain  Patient uses [facility]

## 2018-11-21 NOTE — Telephone Encounter (Signed)
Way too early for this.  Done July 21.  Computer error again?

## 2018-11-22 ENCOUNTER — Encounter: Payer: Self-pay | Admitting: Orthopaedic Surgery

## 2018-11-22 ENCOUNTER — Ambulatory Visit: Payer: 59 | Admitting: Orthopaedic Surgery

## 2018-12-05 ENCOUNTER — Other Ambulatory Visit: Payer: Self-pay | Admitting: Orthopaedic Surgery

## 2018-12-18 DIAGNOSIS — F1120 Opioid dependence, uncomplicated: Secondary | ICD-10-CM | POA: Diagnosis not present

## 2019-01-16 ENCOUNTER — Other Ambulatory Visit: Payer: Self-pay | Admitting: Orthopaedic Surgery

## 2019-03-08 ENCOUNTER — Other Ambulatory Visit (INDEPENDENT_AMBULATORY_CARE_PROVIDER_SITE_OTHER): Payer: 59 | Admitting: *Deleted

## 2019-03-08 ENCOUNTER — Other Ambulatory Visit: Payer: Self-pay

## 2019-03-08 DIAGNOSIS — Z23 Encounter for immunization: Secondary | ICD-10-CM | POA: Diagnosis not present

## 2019-05-20 ENCOUNTER — Encounter: Payer: Self-pay | Admitting: Family Medicine

## 2019-05-22 ENCOUNTER — Telehealth: Payer: Self-pay | Admitting: Orthopaedic Surgery

## 2019-05-22 ENCOUNTER — Other Ambulatory Visit: Payer: Self-pay

## 2019-05-22 ENCOUNTER — Ambulatory Visit (INDEPENDENT_AMBULATORY_CARE_PROVIDER_SITE_OTHER): Payer: 59 | Admitting: Orthopaedic Surgery

## 2019-05-22 ENCOUNTER — Encounter: Payer: Self-pay | Admitting: Orthopaedic Surgery

## 2019-05-22 DIAGNOSIS — G8929 Other chronic pain: Secondary | ICD-10-CM

## 2019-05-22 DIAGNOSIS — M25512 Pain in left shoulder: Secondary | ICD-10-CM | POA: Diagnosis not present

## 2019-05-22 MED ORDER — HYDROCODONE-ACETAMINOPHEN 5-325 MG PO TABS: ORAL_TABLET | ORAL | 0 refills | Status: DC

## 2019-05-22 NOTE — Telephone Encounter (Signed)
Patient called back following visit today at [facility] office; said did not get to ask for refill of medication: HYDROcodone-acetaminophen (NORCO) 7.5-325 MG tablet 75 tablet  -[facility] as in system

## 2019-05-22 NOTE — Progress Notes (Signed)
PROCEDURE NOTE:  The patient request injection, verbal consent was obtained.  The left shoulder was prepped appropriately after time out was performed.   Sterile technique was observed and injection of 1 cc of Depo-Medrol 40 mg with several cc's of plain xylocaine. Anesthesia was provided by ethyl chloride and a 20-gauge needle was used to inject the shoulder area. A posterior approach was used.  The injection was tolerated well.  A band aid dressing was applied.  The patient was advised to apply ice later today and tomorrow to the injection sight as needed.  Out of work today and tomorrow.  Return as needed.  Electronically Signed Darreld Mclean, MD 2/3/202111:06 AM

## 2019-06-27 ENCOUNTER — Ambulatory Visit: Payer: 59 | Attending: Internal Medicine

## 2019-07-11 ENCOUNTER — Encounter: Payer: Self-pay | Admitting: Orthopaedic Surgery

## 2019-07-11 ENCOUNTER — Ambulatory Visit: Payer: 59 | Admitting: Orthopaedic Surgery

## 2019-09-05 ENCOUNTER — Ambulatory Visit: Payer: 59 | Admitting: Orthopaedic Surgery

## 2019-09-05 ENCOUNTER — Encounter: Payer: Self-pay | Admitting: Orthopaedic Surgery

## 2019-09-05 ENCOUNTER — Other Ambulatory Visit: Payer: Self-pay

## 2019-09-05 DIAGNOSIS — G8929 Other chronic pain: Secondary | ICD-10-CM

## 2019-09-05 DIAGNOSIS — M25512 Pain in left shoulder: Secondary | ICD-10-CM | POA: Diagnosis not present

## 2019-09-05 MED ORDER — HYDROCODONE-ACETAMINOPHEN 5-325 MG PO TABS: 1.0000 | ORAL_TABLET | Freq: Four times a day (QID) | ORAL | 0 refills | Status: DC | PRN

## 2019-09-05 NOTE — Progress Notes (Signed)
PROCEDURE NOTE:  The patient request injection, verbal consent was obtained.  The left shoulder was prepped appropriately after time out was performed.   Sterile technique was observed and injection of 1 cc of Depo-Medrol 40 mg with several cc's of plain xylocaine. Anesthesia was provided by ethyl chloride and a 20-gauge needle was used to inject the shoulder area. A posterior approach was used.  The injection was tolerated well.  A band aid dressing was applied.  The patient was advised to apply ice later today and tomorrow to the injection sight as needed.  Return in one month.  I have reviewed the [place] Controlled Substance Reporting System web site prior to prescribing narcotic medicine for this patient.   Electronically Signed Darreld Mclean, MD 5/20/202110:12 AM

## 2019-10-03 ENCOUNTER — Ambulatory Visit (INDEPENDENT_AMBULATORY_CARE_PROVIDER_SITE_OTHER): Payer: 59 | Admitting: Orthopaedic Surgery

## 2019-10-03 ENCOUNTER — Encounter: Payer: Self-pay | Admitting: Orthopaedic Surgery

## 2019-10-03 ENCOUNTER — Other Ambulatory Visit: Payer: Self-pay

## 2019-10-03 DIAGNOSIS — M25512 Pain in left shoulder: Secondary | ICD-10-CM

## 2019-10-03 DIAGNOSIS — G8929 Other chronic pain: Secondary | ICD-10-CM

## 2019-10-03 MED ORDER — HYDROCODONE-ACETAMINOPHEN 5-325 MG PO TABS: 1.0000 | ORAL_TABLET | Freq: Four times a day (QID) | ORAL | 0 refills | Status: DC | PRN

## 2019-10-03 NOTE — Progress Notes (Signed)
PROCEDURE NOTE:  The patient request injection, verbal consent was obtained.  The left shoulder was prepped appropriately after time out was performed.   Sterile technique was observed and injection of 1 cc of Depo-Medrol 40 mg with several cc's of plain xylocaine. Anesthesia was provided by ethyl chloride and a 20-gauge needle was used to inject the shoulder area. A posterior approach was used.  The injection was tolerated well.  A band aid dressing was applied.  The patient was advised to apply ice later today and tomorrow to the injection sight as needed.  I have reviewed the [place] Controlled Substance Reporting System web site prior to prescribing narcotic medicine for this patient.   Return in one month.  Electronically Signed Darreld Mclean, MD 6/17/20218:30 AM

## 2019-10-24 DIAGNOSIS — L739 Follicular disorder, unspecified: Secondary | ICD-10-CM | POA: Diagnosis not present

## 2019-10-24 DIAGNOSIS — R1031 Right lower quadrant pain: Secondary | ICD-10-CM | POA: Diagnosis not present

## 2019-10-31 ENCOUNTER — Encounter: Payer: Self-pay | Admitting: Orthopaedic Surgery

## 2019-10-31 ENCOUNTER — Other Ambulatory Visit: Payer: Self-pay

## 2019-10-31 ENCOUNTER — Ambulatory Visit (INDEPENDENT_AMBULATORY_CARE_PROVIDER_SITE_OTHER): Payer: 59 | Admitting: General Surgery

## 2019-10-31 ENCOUNTER — Encounter: Payer: Self-pay | Admitting: General Surgery

## 2019-10-31 ENCOUNTER — Ambulatory Visit: Payer: 59

## 2019-10-31 ENCOUNTER — Telehealth: Payer: Self-pay | Admitting: Orthopaedic Surgery

## 2019-10-31 ENCOUNTER — Ambulatory Visit (INDEPENDENT_AMBULATORY_CARE_PROVIDER_SITE_OTHER): Payer: 59 | Admitting: Orthopaedic Surgery

## 2019-10-31 VITALS — BP 116/73 | HR 66 | Temp 97.7°F | Resp 16 | Ht 74.0 in | Wt 189.0 lb

## 2019-10-31 VITALS — BP 157/85 | HR 82 | Ht 74.0 in | Wt 189.2 lb

## 2019-10-31 DIAGNOSIS — M25512 Pain in left shoulder: Secondary | ICD-10-CM | POA: Diagnosis not present

## 2019-10-31 DIAGNOSIS — M25511 Pain in right shoulder: Secondary | ICD-10-CM

## 2019-10-31 DIAGNOSIS — G8929 Other chronic pain: Secondary | ICD-10-CM

## 2019-10-31 DIAGNOSIS — K4091 Unilateral inguinal hernia, without obstruction or gangrene, recurrent: Secondary | ICD-10-CM

## 2019-10-31 MED ORDER — HYDROCODONE-ACETAMINOPHEN 5-325 MG PO TABS: 1.0000 | ORAL_TABLET | Freq: Four times a day (QID) | ORAL | 0 refills | Status: DC | PRN

## 2019-10-31 NOTE — Progress Notes (Signed)
Eric Lowery; [id number]; Jan 29, 1986   HPI Patient is a 34 year old white male who was referred to my care by Dr. Wende Crease for evaluation treatment of right groin pain.  Patient was doing significant manual labor approximately 2 weeks ago and developed acute onset of right groin pain.  It is aggravated with movement or straining.  He denies any vomiting.  He has had a right inguinal herniorrhaphy in the remote past.  He has not noticed a lump.  He was concerned that his hernia had recurred. Past Medical History:  Diagnosis Date  . Asthma   . Chronic back pain   . Chronic neck pain   . GERD (gastroesophageal reflux disease)   . Hypertension     Past Surgical History:  Procedure Laterality Date  . HERNIA REPAIR    . thumb surgery      Family History  Problem Relation Age of Onset  . Hypertension Mother   . Seizures Mother   . Cancer Brother     Current Outpatient Medications on File Prior to Visit  Medication Sig Dispense Refill  . albuterol (PROVENTIL HFA;VENTOLIN HFA) 108 (90 BASE) MCG/ACT inhaler Inhale 1-2 puffs into the lungs every 6 (six) hours as needed for wheezing or shortness of breath. 1 Inhaler 1  . HYDROcodone-acetaminophen (NORCO/VICODIN) 5-325 MG tablet Take 1 tablet by mouth every 6 (six) hours as needed for moderate pain (Must last 30 days.). 80 tablet 0  . ibuprofen (ADVIL,MOTRIN) 800 MG tablet Take 1 tablet (800 mg total) by mouth every 8 (eight) hours as needed for moderate pain. 21 tablet 0  . propranolol (INDERAL) 80 MG tablet Take 80 mg by mouth daily.     No current facility-administered medications on file prior to visit.    Allergies  Allergen Reactions  . Peanuts [Nuts] Anaphylaxis  . Penicillins Anaphylaxis    Has patient had a PCN reaction causing immediate rash, facial/tongue/throat swelling, SOB or lightheadedness with hypotension: Yes Has patient had a PCN reaction causing severe rash involving mucus membranes or skin necrosis: No Has patient  had a PCN reaction that required hospitalization unknown Has patient had a PCN reaction occurring within the last 10 years: No If all of the above answers are "NO", then may proceed with Cephalosporin use.   . Shellfish Allergy Anaphylaxis  . Flexeril [Cyclobenzaprine Hcl] Other (See Comments)    Irregular heartbeat, rash    Social History   Substance and Sexual Activity  Alcohol Use No   Comment: occ    Social History   Tobacco Use  Smoking Status Current Every Day Smoker  . Packs/day: 0.01  . Types: Cigarettes  . Last attempt to quit: 04/11/2018  . Years since quitting: 1.5  Smokeless Tobacco Never Used    Review of Systems  Constitutional: Negative.   HENT: Negative.   Eyes: Negative.   Respiratory: Negative.   Cardiovascular: Negative.   Gastrointestinal: Positive for abdominal pain and nausea.  Genitourinary: Negative.   Musculoskeletal: Positive for back pain, joint pain and neck pain.  Skin: Negative.   Neurological: Negative.   Endo/Heme/Allergies: Negative.   Psychiatric/Behavioral: Negative.     Objective   Vitals:   10/31/19 1026  BP: 116/73  Pulse: 66  Resp: 16  Temp: 97.7 F (36.5 C)  SpO2: 96%    Physical Exam Vitals reviewed.  Constitutional:      Appearance: Normal appearance. He is normal weight. He is not ill-appearing.  HENT:     Head: Normocephalic and atraumatic.  Cardiovascular:     Rate and Rhythm: Normal rate and regular rhythm.     Heart sounds: Normal heart sounds. No murmur heard.  No friction rub. No gallop.   Pulmonary:     Effort: Pulmonary effort is normal. No respiratory distress.     Breath sounds: Normal breath sounds. No stridor. No wheezing, rhonchi or rales.  Abdominal:     General: Abdomen is flat. Bowel sounds are normal. There is no distension.     Palpations: Abdomen is soft. There is no mass.     Tenderness: There is no abdominal tenderness. There is no guarding or rebound.     Hernia: A hernia is  present.     Comments: Small recurrent right inguinal hernia noted just lateral to the pubic tubercle.  Surgical scars present.  Skin:    General: Skin is warm and dry.  Neurological:     Mental Status: He is alert and oriented to person, place, and time.     Assessment  Recurrent right inguinal hernia Plan   I told the patient that before we rushed him to surgery, I would like to try ibuprofen for the next month and avoid straining the right groin area.  If we can switch this to and asymptomatic recurrent right inguinal hernia, we could delay any surgical intervention.  He is fine with that.  We will follow-up with me in 1 month.

## 2019-10-31 NOTE — Telephone Encounter (Signed)
Patient called back after today's visit - due to leaving on flight 3:45pm today for brother's [place] of [place], may his prescription be filled today? Pharmacy advises patient to request Dr for approval.

## 2019-10-31 NOTE — Patient Instructions (Signed)

## 2019-10-31 NOTE — Patient Instructions (Signed)

## 2019-10-31 NOTE — Progress Notes (Signed)
Patient IN:Eric Lowery, male DOB:1985/08/19, 34 y.o. MCN:[id number]  Chief Complaint  Patient presents with  . Shoulder Pain    L/wants injection but doing better    HPI  Eric Lowery is a 34 y.o. male who has developed pain in the right shoulder.  He was working on trees and later that evening he had pain in the right shoulder that got worse.  He has limited motion.  This happened about four days ago.  He has no numbness, no swelling.  He has no redness.  The left shoulder still hurts more at night.  He would like an injection for this today.   Body mass index is 24.3 kg/m.  ROS  Review of Systems  Constitutional: Positive for activity change.  Respiratory: Positive for shortness of breath.   Musculoskeletal: Positive for arthralgias, back pain, myalgias and neck pain.  Neurological: Positive for weakness.  All other systems reviewed and are negative.   All other systems reviewed and are negative.  The following is a summary of the past history medically, past history surgically, known current medicines, social history and family history.  This information is gathered electronically by the computer from prior information and documentation.  I review this each visit and have found including this information at this point in the chart is beneficial and informative.    Past Medical History:  Diagnosis Date  . Asthma   . Chronic back pain   . Chronic neck pain   . GERD (gastroesophageal reflux disease)   . Hypertension     Past Surgical History:  Procedure Laterality Date  . HERNIA REPAIR    . thumb surgery      Family History  Problem Relation Age of Onset  . Hypertension Mother   . Seizures Mother   . Cancer Brother     Social History Social History   Tobacco Use  . Smoking status: Current Every Day Smoker    Packs/day: 0.01    Types: Cigarettes    Last attempt to quit: 04/11/2018    Years since quitting: 1.5  . Smokeless tobacco: Never Used   Substance Use Topics  . Alcohol use: No    Comment: occ  . Drug use: No    Allergies  Allergen Reactions  . Peanuts [Nuts] Anaphylaxis  . Penicillins Anaphylaxis    Has patient had a PCN reaction causing immediate rash, facial/tongue/throat swelling, SOB or lightheadedness with hypotension: Yes Has patient had a PCN reaction causing severe rash involving mucus membranes or skin necrosis: No Has patient had a PCN reaction that required hospitalization unknown Has patient had a PCN reaction occurring within the last 10 years: No If all of the above answers are "NO", then may proceed with Cephalosporin use.   . Shellfish Allergy Anaphylaxis  . Flexeril [Cyclobenzaprine Hcl] Other (See Comments)    Irregular heartbeat, rash    Current Outpatient Medications  Medication Sig Dispense Refill  . albuterol (PROVENTIL HFA;VENTOLIN HFA) 108 (90 BASE) MCG/ACT inhaler Inhale 1-2 puffs into the lungs every 6 (six) hours as needed for wheezing or shortness of breath. 1 Inhaler 1  . HYDROcodone-acetaminophen (NORCO/VICODIN) 5-325 MG tablet Take 1 tablet by mouth every 6 (six) hours as needed for moderate pain (Must last 30 days.). 80 tablet 0  . ibuprofen (ADVIL,MOTRIN) 800 MG tablet Take 1 tablet (800 mg total) by mouth every 8 (eight) hours as needed for moderate pain. 21 tablet 0  . propranolol (INDERAL) 80 MG tablet Take 80  mg by mouth daily.     No current facility-administered medications for this visit.     Physical Exam  Blood pressure (!) 157/85, pulse 82, height 6\' 2"  (1.88 m), weight 189 lb 4 oz (85.8 kg).  Constitutional: overall normal hygiene, normal nutrition, well developed, normal grooming, normal body habitus. Assistive device:none  Musculoskeletal: gait and station Limp none, muscle tone and strength are normal, no tremors or atrophy is present.  .  Neurological: coordination overall normal.  Deep tendon reflex/nerve stretch intact.  Sensation normal.  Cranial nerves  II-XII intact.   Skin:   Normal overall no scars, lesions, ulcers or rashes. No psoriasis.  Psychiatric: Alert and oriented x 3.  Recent memory intact, remote memory unclear.  Normal mood and affect. Well groomed.  Good eye contact.  Cardiovascular: overall no swelling, no varicosities, no edema bilaterally, normal temperatures of the legs and arms, no clubbing, cyanosis and good capillary refill.  Lymphatic: palpation is normal.  Left shoulder has tenderness but good ROM.  Pain in the extremes.  Examination of right Upper Extremity is done.  Inspection:   Overall:  Elbow non-tender without crepitus or defects, forearm non-tender without crepitus or defects, wrist non-tender without crepitus or defects, hand non-tender.    Shoulder: with glenohumeral joint tenderness, without effusion.   Upper arm:  without swelling and tenderness   Range of motion:   Overall:  Full range of motion of the elbow, full range of motion of wrist and full range of motion in fingers.   Shoulder:  right  120 degrees forward flexion; 90 degrees abduction; 25 degrees internal rotation, 25 degrees external rotation, 10 degrees extension, 40 degrees adduction.   Stability:   Overall:  Shoulder, elbow and wrist stable   Strength and Tone:   Overall full shoulder muscles strength, full upper arm strength and normal upper arm bulk and tone.  All other systems reviewed and are negative   The patient has been educated about the nature of the problem(s) and counseled on treatment options.  The patient appeared to understand what I have discussed and is in agreement with it.  Encounter Diagnoses  Name Primary?  . Acute pain of right shoulder Yes  . Chronic pain in left shoulder    PROCEDURE NOTE:  The patient request injection, verbal consent was obtained.  The left shoulder was prepped appropriately after time out was performed.   Sterile technique was observed and injection of 1 cc of Depo-Medrol 40 mg  with several cc's of plain xylocaine. Anesthesia was provided by ethyl chloride and a 20-gauge needle was used to inject the shoulder area. A posterior approach was used.  The injection was tolerated well.  A band aid dressing was applied.  The patient was advised to apply ice later today and tomorrow to the injection sight as needed.    PLAN Call if any problems.  Precautions discussed.  Continue current medications.   Return to clinic 2 weeks   Begin PT for the right shoulder.  I will call in pain medicine.  I have reviewed the Controlled Substance Reporting System web site prior to prescribing narcotic medicine for this patient.   Electronically Signed [place], MD 7/15/202112:18 PM

## 2019-11-04 NOTE — Addendum Note (Signed)
Addended by: Jodene Nam A on: 11/04/2019 09:54 AM   Modules accepted: Orders

## 2019-11-19 ENCOUNTER — Encounter: Payer: Self-pay | Admitting: Orthopaedic Surgery

## 2019-11-19 ENCOUNTER — Ambulatory Visit: Payer: 59 | Admitting: Orthopaedic Surgery

## 2019-11-22 ENCOUNTER — Ambulatory Visit (HOSPITAL_COMMUNITY): Payer: 59 | Attending: Orthopaedic Surgery | Admitting: Occupational Therapy

## 2019-11-28 ENCOUNTER — Encounter (HOSPITAL_COMMUNITY): Payer: 59 | Admitting: Physical Therapy

## 2019-11-28 ENCOUNTER — Telehealth: Payer: Self-pay | Admitting: Orthopaedic Surgery

## 2019-11-28 MED ORDER — HYDROCODONE-ACETAMINOPHEN 5-325 MG PO TABS: 1.0000 | ORAL_TABLET | Freq: Four times a day (QID) | ORAL | 0 refills | Status: AC | PRN

## 2019-11-28 NOTE — Telephone Encounter (Signed)
Patient called to request refill - aware of need to reschedule appointment from 11/19/19 - patient rescheduled to date 12/03/19; aware Dr Hilda Lias to review medication: HYDROcodone-acetaminophen (NORCO/VICODIN) 5-325 MG tablet 80 tablet  -[facility]

## 2019-12-03 ENCOUNTER — Ambulatory Visit: Payer: 59 | Admitting: Orthopaedic Surgery

## 2019-12-03 ENCOUNTER — Ambulatory Visit: Payer: 59 | Admitting: General Surgery

## 2019-12-05 ENCOUNTER — Telehealth: Payer: Self-pay | Admitting: Radiology

## 2019-12-05 NOTE — Telephone Encounter (Signed)
To Shayne Alken also. No meds until seen patient has declined to schedule the OT

## 2019-12-05 NOTE — Telephone Encounter (Signed)
Noted.  I cannot give any medicine until seen.

## 2019-12-05 NOTE — Telephone Encounter (Signed)
Patient has not returned calls to schedule appointments for OT/ they have reached out to him x 6 with no response, referral closed, patient refused service  To Dr Charlcie Cradle

## 2019-12-30 DIAGNOSIS — Z23 Encounter for immunization: Secondary | ICD-10-CM | POA: Diagnosis not present

## 2020-02-13 ENCOUNTER — Telehealth: Payer: Self-pay | Admitting: Orthopaedic Surgery

## 2020-02-13 NOTE — Telephone Encounter (Signed)
Called patient in response to voice message left during time office was closed for lunch.  Reached, and scheduled for first available with Dr Hilda Lias Tuesday. Relayed that Dr Hilda Lias is out this afternoon until then. Discussed option of [place] urgent care or primary care due to patient requesting to be seen for new problem, states foot pain making it difficult to walk; said also has fallen a couple of times.  Encouraged to seek medical attention today as discussed.

## 2020-02-18 ENCOUNTER — Encounter: Payer: Self-pay | Admitting: Orthopaedic Surgery

## 2020-02-18 ENCOUNTER — Ambulatory Visit (INDEPENDENT_AMBULATORY_CARE_PROVIDER_SITE_OTHER): Payer: 59 | Admitting: Orthopaedic Surgery

## 2020-02-18 ENCOUNTER — Ambulatory Visit: Payer: 59

## 2020-02-18 ENCOUNTER — Other Ambulatory Visit: Payer: Self-pay

## 2020-02-18 VITALS — BP 141/101 | HR 111 | Ht 73.0 in | Wt 190.0 lb

## 2020-02-18 DIAGNOSIS — W19XXXA Unspecified fall, initial encounter: Secondary | ICD-10-CM

## 2020-02-18 DIAGNOSIS — M79604 Pain in right leg: Secondary | ICD-10-CM | POA: Diagnosis not present

## 2020-02-18 DIAGNOSIS — W11XXXA Fall on and from ladder, initial encounter: Secondary | ICD-10-CM | POA: Diagnosis not present

## 2020-02-18 DIAGNOSIS — M545 Low back pain, unspecified: Secondary | ICD-10-CM | POA: Diagnosis not present

## 2020-02-18 DIAGNOSIS — M79671 Pain in right foot: Secondary | ICD-10-CM

## 2020-02-18 DIAGNOSIS — M25571 Pain in right ankle and joints of right foot: Secondary | ICD-10-CM

## 2020-02-18 MED ORDER — PREDNISONE 5 MG (21) PO TBPK
ORAL_TABLET | ORAL | 0 refills | Status: DC
Start: 2020-02-18 — End: 2020-10-27

## 2020-02-18 MED ORDER — HYDROCODONE-ACETAMINOPHEN 5-325 MG PO TABS: 1.0000 | ORAL_TABLET | Freq: Four times a day (QID) | ORAL | 0 refills | Status: AC | PRN

## 2020-02-18 MED ORDER — CYCLOBENZAPRINE HCL 10 MG PO TABS: 10.0000 mg | ORAL_TABLET | Freq: Every day | ORAL | 0 refills | Status: DC

## 2020-02-18 NOTE — Patient Instructions (Signed)
Out of work 

## 2020-02-18 NOTE — Progress Notes (Signed)
Patient Eric Lowery, male DOB:Dec 21, 1985, 34 y.o. YFV:[id number]  Chief Complaint  Patient presents with   Back Pain    LBP, rt hip pain, fall off 8 ft ladder, hypersextension injury right leg, cannot pull toes up   Shoulder Pain    right shld requests injection again    HPI  Eric Lowery is a 34 y.o. male who was cleaning out his gutters a week ago on a 8 foot ladder.  He fell off the ladder and landed on his porch hurting his lower back, right hip, right foot and right ankle.  He has used ice, elevation, pain medicine, Advil, and rest but he still has pain.  He has pain radiating down to the right hip and knee from the back.  He is limping.  He has not gotten crutches.  I will give order for crutches.  He is not improving.   Body mass index is 25.07 kg/m.  ROS  Review of Systems  Constitutional: Positive for activity change.  Respiratory: Positive for shortness of breath.   Musculoskeletal: Positive for arthralgias, back pain, myalgias and neck pain.  Neurological: Positive for weakness.  All other systems reviewed and are negative.   All other systems reviewed and are negative.  The following is a summary of the past history medically, past history surgically, known current medicines, social history and family history.  This information is gathered electronically by the computer from prior information and documentation.  I review this each visit and have found including this information at this point in the chart is beneficial and informative.    Past Medical History:  Diagnosis Date   Asthma    Chronic back pain    Chronic neck pain    GERD (gastroesophageal reflux disease)    Hypertension     Past Surgical History:  Procedure Laterality Date   HERNIA REPAIR     thumb surgery      Family History  Problem Relation Age of Onset   Hypertension Mother    Seizures Mother    Cancer Brother     Social History Social History   Tobacco Use    Smoking status: Current Some Day Smoker    Packs/day: 0.01    Types: Cigarettes    Last attempt to quit: 04/11/2018    Years since quitting: 1.8   Smokeless tobacco: Never Used  Substance Use Topics   Alcohol use: No    Comment: occ   Drug use: No    Allergies  Allergen Reactions   Peanuts [Nuts] Anaphylaxis   Penicillins Anaphylaxis    Has patient had a PCN reaction causing immediate rash, facial/tongue/throat swelling, SOB or lightheadedness with hypotension: Yes Has patient had a PCN reaction causing severe rash involving mucus membranes or skin necrosis: No Has patient had a PCN reaction that required hospitalization unknown Has patient had a PCN reaction occurring within the last 10 years: No If all of the above answers are "NO", then may proceed with Cephalosporin use.    Shellfish Allergy Anaphylaxis   Flexeril [Cyclobenzaprine Hcl] Other (See Comments)    Irregular heartbeat, rash    Current Outpatient Medications  Medication Sig Dispense Refill   albuterol (PROVENTIL HFA;VENTOLIN HFA) 108 (90 BASE) MCG/ACT inhaler Inhale 1-2 puffs into the lungs every 6 (six) hours as needed for wheezing or shortness of breath. 1 Inhaler 1   cyclobenzaprine (FLEXERIL) 10 MG tablet Take 1 tablet (10 mg total) by mouth at bedtime. One tablet every night  at bedtime as needed for spasm. 30 tablet 0   HYDROcodone-acetaminophen (NORCO/VICODIN) 5-325 MG tablet Take 1 tablet by mouth every 6 (six) hours as needed for moderate pain (Must last 30 days.). 75 tablet 0   ibuprofen (ADVIL,MOTRIN) 800 MG tablet Take 1 tablet (800 mg total) by mouth every 8 (eight) hours as needed for moderate pain. 21 tablet 0   predniSONE (STERAPRED UNI-PAK 21 TAB) 5 MG (21) TBPK tablet Take 6 pills first day; 5 pills second day; 4 pills third day; 3 pills fourth day; 2 pills next day and 1 pill last day. 21 tablet 0   propranolol (INDERAL) 80 MG tablet Take 80 mg by mouth daily.     No current  facility-administered medications for this visit.     Physical Exam  Blood pressure (!) 141/101, pulse (!) 111, height 6\' 1"  (1.854 m), weight 190 lb (86.2 kg).  Constitutional: overall normal hygiene, normal nutrition, well developed, normal grooming, normal body habitus. Assistive device:none  Musculoskeletal: gait and station Limp right, muscle tone and strength are normal, no tremors or atrophy is present.  .  Neurological: coordination overall normal.  Deep tendon reflex/nerve stretch intact.  Sensation normal.  Cranial nerves II-XII intact.   Skin:   Normal overall no scars, lesions, ulcers or rashes. No psoriasis.  Psychiatric: Alert and oriented x 3.  Recent memory intact, remote memory unclear.  Normal mood and affect. Well groomed.  Good eye contact.  Cardiovascular: overall no swelling, no varicosities, no edema bilaterally, normal temperatures of the legs and arms, no clubbing, cyanosis and good capillary refill.  Lymphatic: palpation is normal.  Spine/Pelvis examination:  Inspection:  Overall, sacoiliac joint benign and hips nontender; without crepitus or defects.   Thoracic spine inspection: Alignment normal without kyphosis present   Lumbar spine inspection:  Alignment  with normal lumbar lordosis, without scoliosis apparent.   Thoracic spine palpation:  without tenderness of spinal processes   Lumbar spine palpation: without tenderness of lumbar area; without tightness of lumbar muscles    Range of Motion:   Lumbar flexion, forward flexion is normal without pain or tenderness    Lumbar extension is full without pain or tenderness   Left lateral bend is normal without pain or tenderness   Right lateral bend is normal without pain or tenderness   Straight leg raising is normal  Strength & tone: normal   Stability overall normal stability Right foot and ankle are tender but he has no ecchymosis. ROM of ankle is decreased.  NV intact.  He has no edema.  He has  no redness. All other systems reviewed and are negative   The patient has been educated about the nature of the problem(s) and counseled on treatment options.  The patient appeared to understand what I have discussed and is in agreement with it.  Encounter Diagnoses  Name Primary?   Fall, initial encounter Yes   Lumbar pain with radiation down right leg    Acute right ankle pain    Acute foot pain, right    X-rays were done of the lumbar spine, right foot, right ankle, reported separately.  PLAN Call if any problems.  Precautions discussed.  Continue current medications. I will renew his pain medicine.  I will call in prednisone dose pack and muscle relaxant.  I will schedule PT.  Return to clinic 1 week Stay out of work.  Go to PT.  Electronically Signed , MD 11/2/202110:30 AM

## 2020-02-25 ENCOUNTER — Encounter: Payer: Self-pay | Admitting: Orthopaedic Surgery

## 2020-02-25 ENCOUNTER — Ambulatory Visit: Payer: 59 | Admitting: Orthopaedic Surgery

## 2020-03-10 ENCOUNTER — Other Ambulatory Visit: Payer: Self-pay

## 2020-03-10 ENCOUNTER — Other Ambulatory Visit (INDEPENDENT_AMBULATORY_CARE_PROVIDER_SITE_OTHER): Payer: 59 | Admitting: *Deleted

## 2020-03-10 DIAGNOSIS — Z23 Encounter for immunization: Secondary | ICD-10-CM

## 2020-03-26 ENCOUNTER — Encounter: Payer: Self-pay | Admitting: Orthopaedic Surgery

## 2020-03-26 ENCOUNTER — Other Ambulatory Visit: Payer: Self-pay

## 2020-03-26 ENCOUNTER — Telehealth: Payer: Self-pay | Admitting: Orthopaedic Surgery

## 2020-03-26 ENCOUNTER — Ambulatory Visit (INDEPENDENT_AMBULATORY_CARE_PROVIDER_SITE_OTHER): Payer: 59 | Admitting: Orthopaedic Surgery

## 2020-03-26 VITALS — BP 127/80 | HR 82 | Ht 73.0 in | Wt 187.1 lb

## 2020-03-26 DIAGNOSIS — M25512 Pain in left shoulder: Secondary | ICD-10-CM

## 2020-03-26 DIAGNOSIS — G8929 Other chronic pain: Secondary | ICD-10-CM | POA: Diagnosis not present

## 2020-03-26 DIAGNOSIS — F17210 Nicotine dependence, cigarettes, uncomplicated: Secondary | ICD-10-CM

## 2020-03-26 MED ORDER — HYDROCODONE-ACETAMINOPHEN 5-325 MG PO TABS
1.0000 | ORAL_TABLET | Freq: Four times a day (QID) | ORAL | 0 refills | Status: DC | PRN
Start: 2020-03-26 — End: 2020-04-23

## 2020-03-26 NOTE — Telephone Encounter (Signed)
Done

## 2020-03-26 NOTE — Telephone Encounter (Signed)
Patient requests refill Hydrocodone/Acetaminophen 5-325 mgs.  Qty  75  Sig: Take 1 tablet by mouth every 6 (six) hours as needed for moderate pain (Must last 30 days.).  Patient states he uses [facility]

## 2020-03-26 NOTE — Progress Notes (Signed)
PROCEDURE NOTE:  The patient request injection, verbal consent was obtained.  The left shoulder was prepped appropriately after time out was performed.   Sterile technique was observed and injection of 1 cc of Depo-Medrol 40 mg with several cc's of plain xylocaine. Anesthesia was provided by ethyl chloride and a 20-gauge needle was used to inject the shoulder area. A posterior approach was used.  The injection was tolerated well.  A band aid dressing was applied.  The patient was advised to apply ice later today and tomorrow to the injection sight as needed.  Return in one month.  Consider MRI if not better.  I have reviewed the [place] Controlled Substance Reporting System web site prior to prescribing narcotic medicine for this patient.  Electronically Signed Darreld Mclean, MD 12/9/20218:28 AM

## 2020-03-26 NOTE — Patient Instructions (Signed)

## 2020-04-23 ENCOUNTER — Ambulatory Visit (INDEPENDENT_AMBULATORY_CARE_PROVIDER_SITE_OTHER): Payer: 59 | Admitting: Orthopaedic Surgery

## 2020-04-23 ENCOUNTER — Other Ambulatory Visit: Payer: Self-pay

## 2020-04-23 ENCOUNTER — Encounter: Payer: Self-pay | Admitting: Orthopaedic Surgery

## 2020-04-23 DIAGNOSIS — F17210 Nicotine dependence, cigarettes, uncomplicated: Secondary | ICD-10-CM

## 2020-04-23 DIAGNOSIS — M25512 Pain in left shoulder: Secondary | ICD-10-CM

## 2020-04-23 DIAGNOSIS — G8929 Other chronic pain: Secondary | ICD-10-CM

## 2020-04-23 MED ORDER — HYDROCODONE-ACETAMINOPHEN 5-325 MG PO TABS: 1.0000 | ORAL_TABLET | Freq: Four times a day (QID) | ORAL | 0 refills | Status: DC | PRN

## 2020-04-23 NOTE — Progress Notes (Signed)
PROCEDURE NOTE:  The patient request injection, verbal consent was obtained.  The left shoulder was prepped appropriately after time out was performed.   Sterile technique was observed and injection of 1 cc of Depo-Medrol 40 mg with several cc's of plain xylocaine. Anesthesia was provided by ethyl chloride and a 20-gauge needle was used to inject the shoulder area. A posterior approach was used.  The injection was tolerated well.  A band aid dressing was applied.  The patient was advised to apply ice later today and tomorrow to the injection sight as needed.  I have reviewed the [place] Controlled Substance Reporting System web site prior to prescribing narcotic medicine for this patient.   I will get MRI of the shoulder on the left.  Return in two weeks.  Electronically Signed Darreld Mclean, MD 1/6/20228:16 AM

## 2020-05-07 ENCOUNTER — Encounter: Payer: Self-pay | Admitting: Radiology

## 2020-05-19 ENCOUNTER — Encounter: Payer: Self-pay | Admitting: Orthopaedic Surgery

## 2020-05-19 ENCOUNTER — Other Ambulatory Visit: Payer: Self-pay

## 2020-05-19 ENCOUNTER — Ambulatory Visit (INDEPENDENT_AMBULATORY_CARE_PROVIDER_SITE_OTHER): Payer: 59 | Admitting: Orthopaedic Surgery

## 2020-05-19 VITALS — Ht 73.0 in | Wt 190.0 lb

## 2020-05-19 DIAGNOSIS — G8929 Other chronic pain: Secondary | ICD-10-CM | POA: Diagnosis not present

## 2020-05-19 DIAGNOSIS — M25512 Pain in left shoulder: Secondary | ICD-10-CM

## 2020-05-19 DIAGNOSIS — F17210 Nicotine dependence, cigarettes, uncomplicated: Secondary | ICD-10-CM | POA: Diagnosis not present

## 2020-05-19 MED ORDER — HYDROCODONE-ACETAMINOPHEN 5-325 MG PO TABS: 1.0000 | ORAL_TABLET | Freq: Four times a day (QID) | ORAL | 0 refills | Status: AC | PRN

## 2020-05-19 NOTE — Progress Notes (Signed)
Patient Eric Lowery, male DOB:1985-10-19, 35 y.o. ZJQ:[id number]  Chief Complaint  Patient presents with  . Injections    Lt shoulder    HPI  Eric Lowery is a 35 y.o. male who has increasing pain of the left shoulder.  He was to have MRI of the shoulder prior to today but for some reason or another there was a scheduling problem.  It could have been related to the weather and the changing of schedule.  He has more pain.  I will get MRI.  I will see him next week. I will try to increase pain medicine.  He has no new trauma.   Body mass index is 25.07 kg/m.  ROS  Review of Systems  Constitutional: Positive for activity change.  Respiratory: Positive for shortness of breath.   Musculoskeletal: Positive for arthralgias, back pain, myalgias and neck pain.  Neurological: Positive for weakness.  All other systems reviewed and are negative.   All other systems reviewed and are negative.  The following is a summary of the past history medically, past history surgically, known current medicines, social history and family history.  This information is gathered electronically by the computer from prior information and documentation.  I review this each visit and have found including this information at this point in the chart is beneficial and informative.    Past Medical History:  Diagnosis Date  . Asthma   . Chronic back pain   . Chronic neck pain   . GERD (gastroesophageal reflux disease)   . Hypertension     Past Surgical History:  Procedure Laterality Date  . HERNIA REPAIR    . thumb surgery      Family History  Problem Relation Age of Onset  . Hypertension Mother   . Seizures Mother   . Cancer Brother     Social History Social History   Tobacco Use  . Smoking status: Current Some Day Smoker    Packs/day: 0.01    Types: Cigarettes    Last attempt to quit: 04/11/2018    Years since quitting: 2.1  . Smokeless tobacco: Never Used  Substance Use Topics  .  Alcohol use: No    Comment: occ  . Drug use: No    Allergies  Allergen Reactions  . Peanuts [Nuts] Anaphylaxis  . Penicillins Anaphylaxis    Has patient had a PCN reaction causing immediate rash, facial/tongue/throat swelling, SOB or lightheadedness with hypotension: Yes Has patient had a PCN reaction causing severe rash involving mucus membranes or skin necrosis: No Has patient had a PCN reaction that required hospitalization unknown Has patient had a PCN reaction occurring within the last 10 years: No If all of the above answers are "NO", then may proceed with Cephalosporin use.   . Shellfish Allergy Anaphylaxis  . Flexeril [Cyclobenzaprine Hcl] Other (See Comments)    Irregular heartbeat, rash    Current Outpatient Medications  Medication Sig Dispense Refill  . albuterol (PROVENTIL HFA;VENTOLIN HFA) 108 (90 BASE) MCG/ACT inhaler Inhale 1-2 puffs into the lungs every 6 (six) hours as needed for wheezing or shortness of breath. 1 Inhaler 1  . cyclobenzaprine (FLEXERIL) 10 MG tablet Take 1 tablet (10 mg total) by mouth at bedtime. One tablet every night at bedtime as needed for spasm. 30 tablet 0  . HYDROcodone-acetaminophen (NORCO/VICODIN) 5-325 MG tablet Take 1 tablet by mouth every 6 (six) hours as needed for moderate pain (Must last 30 days.). 90 tablet 0  . ibuprofen (ADVIL,MOTRIN) 800  MG tablet Take 1 tablet (800 mg total) by mouth every 8 (eight) hours as needed for moderate pain. 21 tablet 0  . predniSONE (STERAPRED UNI-PAK 21 TAB) 5 MG (21) TBPK tablet Take 6 pills first day; 5 pills second day; 4 pills third day; 3 pills fourth day; 2 pills next day and 1 pill last day. (Patient not taking: Reported on 05/19/2020) 21 tablet 0  . propranolol (INDERAL) 80 MG tablet Take 80 mg by mouth daily.     No current facility-administered medications for this visit.     Physical Exam  Height 6\' 1"  (1.854 m), weight 190 lb (86.2 kg).  Constitutional: overall normal hygiene, normal  nutrition, well developed, normal grooming, normal body habitus. Assistive device:none  Musculoskeletal: gait and station Limp none, muscle tone and strength are normal, no tremors or atrophy is present.  .  Neurological: coordination overall normal.  Deep tendon reflex/nerve stretch intact.  Sensation normal.  Cranial nerves II-XII intact.   Skin:   Normal overall no scars, lesions, ulcers or rashes. No psoriasis.  Psychiatric: Alert and oriented x 3.  Recent memory intact, remote memory unclear.  Normal mood and affect. Well groomed.  Good eye contact.  Cardiovascular: overall no swelling, no varicosities, no edema bilaterally, normal temperatures of the legs and arms, no clubbing, cyanosis and good capillary refill.  Lymphatic: palpation is normal.  All other systems reviewed and are negative   The patient has been educated about the nature of the problem(s) and counseled on treatment options.  The patient appeared to understand what I have discussed and is in agreement with it.  Encounter Diagnoses  Name Primary?  . Chronic pain in left shoulder Yes  . Nicotine dependence, cigarettes, uncomplicated      PLAN Call if any problems.  Precautions discussed.  Continue current medications.   Return to clinic 10 days.   Get MRI of the left shoulder.  PROCEDURE NOTE:  The patient request injection, verbal consent was obtained.  The left shoulder was prepped appropriately after time out was performed.   Sterile technique was observed and injection of 1 cc of Depo-Medrol 40 mg with several cc's of plain xylocaine. Anesthesia was provided by ethyl chloride and a 20-gauge needle was used to inject the shoulder area. A posterior approach was used.  The injection was tolerated well.  A band aid dressing was applied.  The patient was advised to apply ice later today and tomorrow to the injection sight as needed.  I have reviewed the Controlled Substance Reporting  System web site prior to prescribing narcotic medicine for this patient.   Electronically Signed Eric Virginia, MD 2/1/20229:25 AM

## 2020-05-26 ENCOUNTER — Ambulatory Visit (HOSPITAL_COMMUNITY): Admission: RE | Admit: 2020-05-26 | Payer: 59 | Source: Ambulatory Visit

## 2020-05-28 ENCOUNTER — Ambulatory Visit: Payer: 59 | Admitting: Orthopaedic Surgery

## 2020-10-14 ENCOUNTER — Other Ambulatory Visit: Payer: Self-pay

## 2020-10-14 ENCOUNTER — Emergency Department (HOSPITAL_COMMUNITY): Payer: 59

## 2020-10-14 ENCOUNTER — Emergency Department (HOSPITAL_COMMUNITY)
Admission: EM | Admit: 2020-10-14 | Discharge: 2020-10-14 | Disposition: A | Discharge status: Home / Self Care | Payer: 59 | Attending: Emergency Medicine | Admitting: Emergency Medicine

## 2020-10-14 ENCOUNTER — Encounter (HOSPITAL_COMMUNITY): Payer: Self-pay

## 2020-10-14 DIAGNOSIS — W1789XA Other fall from one level to another, initial encounter: Secondary | ICD-10-CM | POA: Insufficient documentation

## 2020-10-14 DIAGNOSIS — M545 Low back pain, unspecified: Secondary | ICD-10-CM | POA: Diagnosis not present

## 2020-10-14 DIAGNOSIS — M5126 Other intervertebral disc displacement, lumbar region: Secondary | ICD-10-CM | POA: Diagnosis not present

## 2020-10-14 DIAGNOSIS — M5416 Radiculopathy, lumbar region: Secondary | ICD-10-CM | POA: Diagnosis not present

## 2020-10-14 DIAGNOSIS — W132XXA Fall from, out of or through roof, initial encounter: Secondary | ICD-10-CM

## 2020-10-14 DIAGNOSIS — J45909 Unspecified asthma, uncomplicated: Secondary | ICD-10-CM | POA: Diagnosis not present

## 2020-10-14 DIAGNOSIS — F17210 Nicotine dependence, cigarettes, uncomplicated: Secondary | ICD-10-CM | POA: Insufficient documentation

## 2020-10-14 DIAGNOSIS — Z79899 Other long term (current) drug therapy: Secondary | ICD-10-CM | POA: Diagnosis not present

## 2020-10-14 DIAGNOSIS — K458 Other specified abdominal hernia without obstruction or gangrene: Secondary | ICD-10-CM | POA: Diagnosis not present

## 2020-10-14 DIAGNOSIS — I10 Essential (primary) hypertension: Secondary | ICD-10-CM | POA: Insufficient documentation

## 2020-10-14 DIAGNOSIS — Z91010 Allergy to peanuts: Secondary | ICD-10-CM | POA: Insufficient documentation

## 2020-10-14 MED ORDER — DEXAMETHASONE SODIUM PHOSPHATE 10 MG/ML IJ SOLN
10.0000 mg | Freq: Once | INTRAMUSCULAR | Status: AC
  Administered 2020-10-14: 10 mg via INTRAVENOUS
  Filled 2020-10-14: qty 1

## 2020-10-14 MED ORDER — OXYCODONE-ACETAMINOPHEN 5-325 MG PO TABS: 1.0000 | ORAL_TABLET | Freq: Four times a day (QID) | ORAL | 0 refills | Status: DC | PRN

## 2020-10-14 MED ORDER — METHOCARBAMOL 500 MG PO TABS
500.0000 mg | ORAL_TABLET | Freq: Once | ORAL | Status: AC
  Administered 2020-10-14: 20:00:00 500 mg via ORAL
  Filled 2020-10-14: qty 1

## 2020-10-14 MED ORDER — NAPROXEN 500 MG PO TABS: 500.0000 mg | ORAL_TABLET | Freq: Two times a day (BID) | ORAL | 0 refills | Status: DC

## 2020-10-14 MED ORDER — METHOCARBAMOL 500 MG PO TABS: 500.0000 mg | ORAL_TABLET | Freq: Two times a day (BID) | ORAL | 0 refills | Status: DC

## 2020-10-14 MED ORDER — HYDROMORPHONE HCL 1 MG/ML IJ SOLN
1.0000 mg | Freq: Once | INTRAMUSCULAR | Status: AC
  Administered 2020-10-14: 20:00:00 1 mg via INTRAVENOUS
  Filled 2020-10-14: qty 1

## 2020-10-14 MED ORDER — HYDROMORPHONE HCL 1 MG/ML IJ SOLN
1.0000 mg | Freq: Once | INTRAMUSCULAR | Status: AC
  Administered 2020-10-14: 1 mg via INTRAVENOUS
  Filled 2020-10-14: qty 1

## 2020-10-14 MED ORDER — METHYLPREDNISOLONE 4 MG PO TBPK: ORAL_TABLET | ORAL | 0 refills | Status: DC

## 2020-10-14 MED ORDER — ONDANSETRON HCL 4 MG/2ML IJ SOLN
4.0000 mg | Freq: Once | INTRAMUSCULAR | Status: AC
  Administered 2020-10-14: 20:00:00 4 mg via INTRAVENOUS
  Filled 2020-10-14: qty 2

## 2020-10-14 MED ORDER — KETOROLAC TROMETHAMINE 30 MG/ML IJ SOLN
30.0000 mg | Freq: Once | INTRAMUSCULAR | Status: AC
  Administered 2020-10-14: 30 mg via INTRAVENOUS
  Filled 2020-10-14: qty 1

## 2020-10-14 NOTE — ED Notes (Addendum)
Vision has been blurry in left eye, since fall. Left hand grip is not as strong as right. Left leg does not raise up as Eric Lowery or high as right leg.  PA student at bedside.

## 2020-10-14 NOTE — ED Triage Notes (Signed)
Pt to er, pt states that he fell off his roof and onto his deck Sunday, states that when he fell he hurt his L leg, states that now he is here today for some L leg and L hip pain, states that his L foot feels like it is asleep, states that it is a normal color, states that he feels like he is walking flat footed.  Denies any incontinent episodes.

## 2020-10-14 NOTE — ED Provider Notes (Signed)
[facility] EMERGENCY DEPARTMENT Provider Note   CSN: [id number] Arrival date & time: 10/14/20  1738     History Chief Complaint  Patient presents with   Leg Pain    Eric Lowery is a 35 y.o. male.  Eric Lowery is a 35 y.o. male with a history of asthma, GERD, hypertension, chronic neck and back pain who presents to the emergency department after she fell from his roof on his deck 4 days ago.  He reports that he was walking back on his roof when he ran out of breath and stepped back and fell from the roof he landed on his left foot and then fell backwards landing on his back.  He reports he did not hit his head.  He did not present for evaluation initially.  He was able to get up and walk but then had worsening pain in his left low back radiating down into his hip and leg.  He reports that now it feels like his left foot is asleep and he has been walking left foot and having to drag his left foot self.  His family has been trying to get him to come in but he was avoiding it until symptoms persisted today.  He denies any pain to the chest or abdomen.  No pain in the head neck or upper back.  He has used some over-the-counter medications for pain without improvement.  No loss of bowel or bladder control, no saddle anesthesia.  No pain, numbness or weakness in the right leg.  Patient is very anxious and concerned about missing work.  The history is provided by the patient.      Past Medical History:  Diagnosis Date   Asthma    Chronic back pain    Chronic neck pain    GERD (gastroesophageal reflux disease)    Hypertension     There are no problems to display for this patient.   Past Surgical History:  Procedure Laterality Date   HERNIA REPAIR     thumb surgery         Family History  Problem Relation Age of Onset   Hypertension Mother    Seizures Mother    Cancer Brother     Social History   Tobacco Use   Smoking status: Some Days    Packs/day: 0.01    Pack  years: 0.00    Types: Cigarettes    Last attempt to quit: 04/11/2018    Years since quitting: 2.5   Smokeless tobacco: Never  Vaping Use   Vaping Use: Never used  Substance Use Topics   Alcohol use: Yes    Comment: occ   Drug use: No    Home Medications Prior to Admission medications   Medication Sig Start Date End Date Taking? Authorizing Provider  methocarbamol (ROBAXIN) 500 MG tablet Take 1 tablet (500 mg total) by mouth 2 (two) times daily. 10/14/20  Yes Dartha Lodge, PA-C  methylPREDNISolone (MEDROL DOSEPAK) 4 MG TBPK tablet Take as directed 10/14/20  Yes Dartha Lodge, PA-C  naproxen (NAPROSYN) 500 MG tablet Take 1 tablet (500 mg total) by mouth 2 (two) times daily. 10/14/20  Yes Dartha Lodge, PA-C  oxyCODONE-acetaminophen (PERCOCET) 5-325 MG tablet Take 1 tablet by mouth every 6 (six) hours as needed. 10/14/20  Yes Dartha Lodge, PA-C  albuterol (PROVENTIL HFA;VENTOLIN HFA) 108 (90 BASE) MCG/ACT inhaler Inhale 1-2 puffs into the lungs every 6 (six) hours as needed for wheezing or  shortness of breath. 12/04/14   Donnetta Hutchingook, Brian, MD  cyclobenzaprine (FLEXERIL) 10 MG tablet Take 1 tablet (10 mg total) by mouth at bedtime. One tablet every night at bedtime as needed for spasm. 02/18/20   Darreld McleanKeeling, Wayne, MD  ibuprofen (ADVIL,MOTRIN) 800 MG tablet Take 1 tablet (800 mg total) by mouth every 8 (eight) hours as needed for moderate pain. 05/26/18   Bethann BerkshireZammit, Joseph, MD  predniSONE (STERAPRED UNI-PAK 21 TAB) 5 MG (21) TBPK tablet Take 6 pills first day; 5 pills second day; 4 pills third day; 3 pills fourth day; 2 pills next day and 1 pill last day. Patient not taking: Reported on 05/19/2020 02/18/20   Darreld McleanKeeling, Wayne, MD  propranolol (INDERAL) 80 MG tablet Take 80 mg by mouth daily.    [provider]    Allergies    Peanuts [nuts], Penicillins, Shellfish allergy, and Flexeril [cyclobenzaprine hcl]  Review of Systems   Review of Systems  Constitutional:  Negative for chills and fever.   HENT: Negative.    Eyes:  Negative for visual disturbance.  Respiratory:  Negative for cough and shortness of breath.   Gastrointestinal:  Negative for abdominal pain, nausea and vomiting.  Musculoskeletal:  Positive for arthralgias and back pain. Negative for myalgias and neck pain.  Skin:  Negative for color change, rash and wound.  Neurological:  Positive for weakness and numbness. Negative for headaches.   Physical Exam Updated Vital Signs BP 119/85 (BP Location: Right Arm)   Pulse (!) 102   Temp 98.2 F (36.8 C) (Oral)   Resp 18   Ht 6\' 1"  (1.854 m)   Wt 93 kg   SpO2 98%   BMI 27.05 kg/m   Physical Exam Vitals and nursing note reviewed.  Constitutional:      General: He is not in acute distress.    Appearance: Normal appearance. He is well-developed. He is not diaphoretic.     Comments: Patient appears very uncomfortable  HENT:     Head: Normocephalic and atraumatic.     Comments: No hematoma, step-off or deformity, negative battle sign, no raccoon eyes    Nose: Nose normal.     Mouth/Throat:     Mouth: Mucous membranes are moist.     Pharynx: Oropharynx is clear.  Eyes:     General:        Right eye: No discharge.        Left eye: No discharge.     Extraocular Movements: Extraocular movements intact.     Pupils: Pupils are equal, round, and reactive to light.  Neck:     Comments: No midline C-spine tenderness, normal range of motion Cardiovascular:     Rate and Rhythm: Normal rate and regular rhythm.     Pulses: Normal pulses.     Heart sounds: Normal heart sounds. No murmur heard.   No friction rub. No gallop.  Pulmonary:     Effort: Pulmonary effort is normal. No respiratory distress.     Breath sounds: Normal breath sounds. No wheezing or rales.     Comments: Respirations equal and unlabored, patient able to speak in full sentences, lungs clear to auscultation bilaterally, no tenderness or ecchymosis over chest wall, no crepitus Abdominal:     General:  Bowel sounds are normal. There is no distension.     Palpations: Abdomen is soft. There is no mass.     Tenderness: There is no abdominal tenderness. There is no guarding.     Comments: Abdomen soft,  nondistended, nontender to palpation in all quadrants without guarding or peritoneal signs, no ecchymosis  Musculoskeletal:        General: No deformity.     Cervical back: Neck supple.     Comments: No midline thoracic spine tenderness, there is focal midline lumbar spine tenderness, and diffuse tenderness and palpable spasm of the left paraspinal muscles, no palpable step off or deformity. Pt with pain with movement of the left upper or lower extremity 2/2 to left low back pain, but no focal deformity or tenderness over the extremities.  All joints supple and easily movable, all compartments soft.  Skin:    General: Skin is warm and dry.     Capillary Refill: Capillary refill takes less than 2 seconds.  Neurological:     Mental Status: He is alert and oriented to person, place, and time.     Coordination: Coordination normal.     Comments: Speech is clear, able to follow commands CN III-XII intact Normal strength in bilateral upper extremities, initially patient exhibited some weakness in the left arm but this seems to be volitional due to pain when I asked patient to push through this he had equal strength bilaterally.  In the left lower leg patient has weakness in particular with dorsiflexion, able to plantarflex own.  Movement in general is limited due to pain in the low back.  5/5 strength in the right lower extremity. Sensation decreased in the left lower leg extending from the knee down to the toe, worse on the medial aspect.  Sensation otherwise intact and equal bilaterally Limited movement of the left upper and lower extremity due to pain, but normal coordination noted  Psychiatric:        Mood and Affect: Mood is anxious.        Behavior: Behavior normal.    ED Results / Procedures /  Treatments   Labs (all labs ordered are listed, but only abnormal results are displayed) Labs Reviewed - No data to display  EKG None  Radiology MR LUMBAR SPINE WO CONTRAST  Result Date: 10/14/2020 CLINICAL DATA:  Left lower back and left hip pain. Left leg numbness. Recently fell from a roof. EXAM: MRI LUMBAR SPINE WITHOUT CONTRAST TECHNIQUE: Multiplanar, multisequence MR imaging of the lumbar spine was performed. No intravenous contrast was administered. COMPARISON:  Lumbar spine radiographs 02/18/2020. Lumbar spine MRI 09/18/2007. FINDINGS: Segmentation:  Standard. Alignment:  Normal. Vertebrae: No fracture, suspicious marrow lesion, or significant marrow edema. Conus medullaris and cauda equina: Conus extends to the T12-L1 level. Conus and cauda equina appear normal. Paraspinal and other soft tissues: Unremarkable. Disc levels: L1-2: Negative. L2-3: Normal disc.  Mild left facet hypertrophy without stenosis. L3-4: Trace disc bulging and mild facet and ligamentum flavum hypertrophy without stenosis. L4-5: Mild disc desiccation and mild disc space narrowing. Left eccentric disc bulging, a shallow central disc protrusion, and mild facet and ligamentum flavum hypertrophy result in mild spinal stenosis and mild left lateral recess stenosis without neural foraminal stenosis. Stenosis at this level is new from the 2009 MRI. L5-S1: Disc desiccation and mild disc space narrowing. Disc bulging, a central disc protrusion, and mild facet and ligamentum flavum hypertrophy without significant stenosis, unchanged. The disc protrusion is in close proximity to the S1 nerve roots without evidence of neural compression. IMPRESSION: 1. Mild spinal stenosis and mild left lateral recess stenosis at L4-5, new from 2009. 2. Unchanged L5-S1 disc protrusion without significant stenosis. Electronically Signed   By: Jolaine Click.D.  On: 10/14/2020 19:20     Procedures Procedures   Medications Ordered in ED Medications   HYDROmorphone (DILAUDID) injection 1 mg (1 mg Intravenous Given 10/14/20 1951)  ondansetron (ZOFRAN) injection 4 mg (4 mg Intravenous Given 10/14/20 1952)  methocarbamol (ROBAXIN) tablet 500 mg (500 mg Oral Given 10/14/20 1954)  dexamethasone (DECADRON) injection 10 mg (10 mg Intravenous Given 10/14/20 2104)  HYDROmorphone (DILAUDID) injection 1 mg (1 mg Intravenous Given 10/14/20 2104)  ketorolac (TORADOL) 30 MG/ML injection 30 mg (30 mg Intravenous Given 10/14/20 2104)    ED Course  I have reviewed the triage vital signs and the nursing notes.  Pertinent labs & imaging results that were available during my care of the patient were reviewed by me and considered in my medical decision making (see chart for details).    MDM Rules/Calculators/A&P                         35 year old male presents with severe low back pain associated with weakness and decreased sensation in the left lower leg after falling from his deck 4 days ago.  Denies other pain or injuries from fall.  No signs of head trauma, no midline cervical or thoracic spine tenderness.  No tenderness over the chest, abdomen or pelvis.  Patient with midline lumbar spine tenderness and left paraspinal tenderness with palpable spasm.  In the left lower extremity patient has decreased sensation in the medial lower leg and some weakness, some of this weakness I suspect is pain mediated but patient in particular struggles with dorsiflexion.  Concern for lumbar fracture.  Will need MRI.  No other spinal tenderness noted, no signs of head injury, do not feel any other imaging is indicated.  We will also treat for pain and muscle spasm.  MRI with no evidence of fracture, there is mild spinal stenosis and mild left lateral recess stenosis at L4-5 which is new from MRI noted in 2009, patient also has L5-S1 disc protrusion without significant stenosis which is unchanged.  Discussed with radiologist, does not feel CT would provide better utility for  noting any fracture.  On reevaluation patient is still in pain, somewhat improved but still seems to have decreased sensation and some difficulty with dorsiflexion.  Patient is very anxious and avoids moving the left leg at all I spent a long time speaking with the patient as I suspect some of this is fear of worsening pain with movement.  After talking with the patient and getting better pain control he was able to move the left leg more and is able to dorsiflex almost weakness.  I discussed case with Dr. Maurice Small with neurosurgery he reviewed patient's MRI, this may be coming from mild stenosis of the lateral recess at L4-5, but given how mild it appears on MRI he would not recommend surgical intervention at this time.  Recommends pain control, antispasmodics and steroids and close follow-up in the office for reevaluation.  Patient's exam does seem to be improving with better pain control I have discussed this plan with him and he expresses understanding and agreement.  Sent in prescriptions for symptom control, discussed strict return precautions and outpatient follow-up with Dr. Maurice Small.  He expresses understanding and agreement.  Discharged home in good condition.  Final Clinical Impression(s) / ED Diagnoses Final diagnoses:  Fall from roof, initial encounter  Lumbar disc herniation  Lumbar radiculopathy    Rx / DC Orders ED Discharge Orders  Ordered    oxyCODONE-acetaminophen (PERCOCET) 5-325 MG tablet  Every 6 hours PRN        10/14/20 2131    naproxen (NAPROSYN) 500 MG tablet  2 times daily        10/14/20 2131    methocarbamol (ROBAXIN) 500 MG tablet  2 times daily        10/14/20 2131    methylPREDNISolone (MEDROL DOSEPAK) 4 MG TBPK tablet        10/14/20 2131             Dartha Lodge, PA-C 10/19/20 1109    Bethann Berkshire, MD 10/20/20 [phone]

## 2020-10-14 NOTE — ED Notes (Signed)
Pt medicated per MAR. Provided diet soda and crackers as approved by EDP. Will continue to monitor. Pt updated on POC.

## 2020-10-14 NOTE — Discharge Instructions (Addendum)
HOME INSTRUCTIONS  You can use ice and heat to help with pain, you can also use over-the-counter Salonpas lidocaine patches to help with pain.  Medications are also useful to help with pain control.   Acetaminophen.  This medication is generally safe, and found over the counter. Take as directed for your age. You should not take more than 8 of the extra strength (500mg ) pills a day (max dose is 4000mg  total OVER one day)  Non steroidal anti inflammatory: This includes medications including Ibuprofen, naproxen and Mobic; These medications help both pain and swelling and are very useful in treating back pain.  They should be taken with food, as they can cause stomach upset, and more seriously, stomach bleeding. Do not combine the medications.  Percocet (oxycodone/acetaminophen): This is a narcotic pain medication to be used for severe pain, this medication can cause drowsiness, do not take before driving or operating any machinery.  Use medication with caution with persistent use it can be addicting.  Muscle relaxants:  These medications can help with muscle tightness that is a cause of lower back pain.  Most of these medications can cause drowsiness, and it is not safe to drive or use dangerous machinery while taking them. They are primarily helpful when taken at night before sleep.  Medrol (methylprednisolone)- This is an oral steroid.  This medication is best taken with food in the morning.  Please note that this medication can cause anxiety, mood swings, muscle fatigue, increased hunger, weight gain (sodium/fluid retention), poor sleep as well as other symptoms. If you are a diabetic, please monitor your blood sugars at home as this medication can increase your blood sugars. Call your pharmacist if you have any questions.  You will need to follow up with Dr. with Neurosurgery, call tomorrow to schedule appointment  Be aware that if you develop new symptoms, such as a fever, leg weakness,  difficulty with or loss of control of your urine or bowels, abdominal pain, or more severe pain, you will need to seek medical attention and/or return to the Emergency department.  Additional Information:  Your vital signs today were: BP 119/85 (BP Location: Right Arm)   Pulse (!) 102   Temp 98.2 F (36.8 C) (Oral)   Resp 18   Ht 6\' 1"  (1.854 m)   Wt 93 kg   SpO2 98%   BMI 27.05 kg/m  If your blood pressure (BP) was elevated above 135/85 this visit, please have this repeated by your doctor within one month. ---------------

## 2020-10-14 NOTE — ED Notes (Signed)
Pt returned from MRI °

## 2020-10-14 NOTE — ED Notes (Addendum)
Pt fell Sunday from roof. Pt stated he feel onto his left leg straight legged. Pt c/o left leg numb and left ankle not able to move in flex position independently. Pain is noted only in left hip and left lower back pain.

## 2020-10-21 DIAGNOSIS — M5416 Radiculopathy, lumbar region: Secondary | ICD-10-CM | POA: Diagnosis not present

## 2020-10-21 DIAGNOSIS — R29898 Other symptoms and signs involving the musculoskeletal system: Secondary | ICD-10-CM | POA: Diagnosis not present

## 2020-10-27 ENCOUNTER — Other Ambulatory Visit: Payer: Self-pay

## 2020-10-27 ENCOUNTER — Encounter: Payer: Self-pay | Admitting: Orthopaedic Surgery

## 2020-10-27 ENCOUNTER — Ambulatory Visit (INDEPENDENT_AMBULATORY_CARE_PROVIDER_SITE_OTHER): Payer: 59 | Admitting: Orthopaedic Surgery

## 2020-10-27 ENCOUNTER — Ambulatory Visit: Payer: 59

## 2020-10-27 VITALS — BP 151/97 | HR 90 | Ht 73.0 in | Wt 205.0 lb

## 2020-10-27 DIAGNOSIS — G8929 Other chronic pain: Secondary | ICD-10-CM

## 2020-10-27 DIAGNOSIS — M25512 Pain in left shoulder: Secondary | ICD-10-CM

## 2020-10-27 MED ORDER — HYDROCODONE-ACETAMINOPHEN 5-325 MG PO TABS: ORAL_TABLET | ORAL | 0 refills | Status: DC

## 2020-10-27 NOTE — Patient Instructions (Signed)
While we are working on your approval for MRI please go ahead and call to schedule your appointment with [facility] within at least one (1) week.   Central Scheduling [phone]  

## 2020-10-27 NOTE — Progress Notes (Signed)
I fell and hurt my shoulder.  He was putting shingles on his roof.  He fell and hurt his left shoulder on 10-05-20.  He was seen in the ER.  He had MRI of the lower back and it was negative.  No X-rays were done of the left shoulder.  The back is better but the shoulder is worse.   He has history of shoulder pain on the left.  I have independently reviewed the MRI.    I have reviewed the notes from the ER.  He has no numbness but has pain with use of the left shoulder.  ROM of the left shoulder is full but painful.  NV intact. Grips are normal. ROM of neck is full.  X-rays were done of the left shoulder, reported separately.  Encounter Diagnosis  Name Primary?   Chronic pain in left shoulder Yes   PROCEDURE NOTE:  The patient request injection, verbal consent was obtained.  The left shoulder was prepped appropriately after time out was performed.   Sterile technique was observed and injection of 1 cc of Celestone 6mg  with several cc's of plain xylocaine. Anesthesia was provided by ethyl chloride and a 20-gauge needle was used to inject the shoulder area. A posterior approach was used.  The injection was tolerated well.  A band aid dressing was applied.  The patient was advised to apply ice later today and tomorrow to the injection sight as needed.  I will get MRI of the left shoulder.  Return in three weeks.  I have reviewed the Controlled Substance Reporting System web site prior to prescribing narcotic medicine for this patient.  Call if any problem.  Precautions discussed.    Electronically Signed [place], MD 7/12/202210:26 AM

## 2020-11-03 ENCOUNTER — Telehealth: Payer: Self-pay | Admitting: Orthopaedic Surgery

## 2020-11-03 MED ORDER — HYDROCODONE-ACETAMINOPHEN 5-325 MG PO TABS: ORAL_TABLET | ORAL | 0 refills | Status: DC

## 2020-11-03 NOTE — Telephone Encounter (Signed)
Patient requests refill on Hydrocodone/Acetaminophen 5-325  mgs.  Qty  30   Sig: One tablet every four hours as needed for acute pain.  Limit of five days per [place] statue.   Patient states he uses [facility] 

## 2020-11-10 ENCOUNTER — Telehealth: Payer: Self-pay | Admitting: Orthopaedic Surgery

## 2020-11-10 MED ORDER — HYDROCODONE-ACETAMINOPHEN 5-325 MG PO TABS: ORAL_TABLET | ORAL | 0 refills | Status: DC

## 2020-11-10 NOTE — Telephone Encounter (Signed)
Patient requests refill on Hydrocodone/Acetaminophen 5-325  mgs.  Qty  30   Sig: One tablet every four hours as needed for acute pain.  Limit of five days per [place] statue.   Patient states he uses [facility]

## 2020-11-19 ENCOUNTER — Ambulatory Visit: Payer: 59 | Admitting: Orthopaedic Surgery

## 2020-11-25 ENCOUNTER — Ambulatory Visit (HOSPITAL_COMMUNITY): Payer: 59 | Attending: Orthopaedic Surgery

## 2020-12-02 DIAGNOSIS — G5702 Lesion of sciatic nerve, left lower limb: Secondary | ICD-10-CM | POA: Diagnosis not present

## 2020-12-02 DIAGNOSIS — R29898 Other symptoms and signs involving the musculoskeletal system: Secondary | ICD-10-CM | POA: Diagnosis not present

## 2021-01-07 DIAGNOSIS — G5702 Lesion of sciatic nerve, left lower limb: Secondary | ICD-10-CM | POA: Diagnosis not present

## 2021-01-20 DIAGNOSIS — M5416 Radiculopathy, lumbar region: Secondary | ICD-10-CM | POA: Diagnosis not present

## 2021-02-05 DIAGNOSIS — M5417 Radiculopathy, lumbosacral region: Secondary | ICD-10-CM | POA: Diagnosis not present

## 2021-02-05 DIAGNOSIS — R293 Abnormal posture: Secondary | ICD-10-CM | POA: Diagnosis not present

## 2021-02-05 DIAGNOSIS — R2689 Other abnormalities of gait and mobility: Secondary | ICD-10-CM | POA: Diagnosis not present

## 2021-02-05 DIAGNOSIS — R531 Weakness: Secondary | ICD-10-CM | POA: Diagnosis not present

## 2021-04-20 ENCOUNTER — Ambulatory Visit: Payer: 59 | Admitting: Nurse Practitioner

## 2021-04-21 DIAGNOSIS — Z20822 Contact with and (suspected) exposure to covid-19: Secondary | ICD-10-CM | POA: Diagnosis not present

## 2021-04-22 DIAGNOSIS — Z20822 Contact with and (suspected) exposure to covid-19: Secondary | ICD-10-CM | POA: Diagnosis not present

## 2021-04-23 DIAGNOSIS — Z20822 Contact with and (suspected) exposure to covid-19: Secondary | ICD-10-CM | POA: Diagnosis not present

## 2021-04-28 ENCOUNTER — Encounter: Payer: Self-pay | Admitting: Emergency Medicine

## 2021-04-28 ENCOUNTER — Ambulatory Visit
Admission: EM | Admit: 2021-04-28 | Discharge: 2021-04-28 | Disposition: A | Discharge status: Home / Self Care | Payer: 59 | Attending: Urgent Care | Admitting: Urgent Care

## 2021-04-28 ENCOUNTER — Other Ambulatory Visit: Payer: Self-pay

## 2021-04-28 DIAGNOSIS — R070 Pain in throat: Secondary | ICD-10-CM

## 2021-04-28 DIAGNOSIS — Z87891 Personal history of nicotine dependence: Secondary | ICD-10-CM

## 2021-04-28 DIAGNOSIS — R052 Subacute cough: Secondary | ICD-10-CM | POA: Diagnosis not present

## 2021-04-28 DIAGNOSIS — J029 Acute pharyngitis, unspecified: Secondary | ICD-10-CM

## 2021-04-28 DIAGNOSIS — Z8709 Personal history of other diseases of the respiratory system: Secondary | ICD-10-CM | POA: Diagnosis not present

## 2021-04-28 LAB — POCT RAPID STREP A (OFFICE): Rapid Strep A Screen: NEGATIVE

## 2021-04-28 MED ORDER — AZITHROMYCIN 250 MG PO TABS: ORAL_TABLET | ORAL | 0 refills | Status: DC

## 2021-04-28 NOTE — ED Provider Notes (Signed)
[facility]   MRN: [id number] DOB: 07/11/1985  Subjective:   Eric Lowery is a 36 y.o. male presenting for 1 week history of acute onset persistent severe cough, throat pain rated 10/10. Has also had fevers, has been controlled with Motrin. Quit smoking. No chest pain, shob, wheezing. Has felt much worse in the past 2 days. Did a COVID test and was negative. Had tonsillectomy as a child.   No current facility-administered medications for this encounter.  Current Outpatient Medications:    ibuprofen (ADVIL,MOTRIN) 800 MG tablet, Take 1 tablet (800 mg total) by mouth every 8 (eight) hours as needed for moderate pain., Disp: 21 tablet, Rfl: 0   propranolol (INDERAL) 80 MG tablet, Take 80 mg by mouth daily., Disp: , Rfl:    albuterol (PROVENTIL HFA;VENTOLIN HFA) 108 (90 BASE) MCG/ACT inhaler, Inhale 1-2 puffs into the lungs every 6 (six) hours as needed for wheezing or shortness of breath., Disp: 1 Inhaler, Rfl: 1   cyclobenzaprine (FLEXERIL) 10 MG tablet, Take 1 tablet (10 mg total) by mouth at bedtime. One tablet every night at bedtime as needed for spasm., Disp: 30 tablet, Rfl: 0   HYDROcodone-acetaminophen (NORCO/VICODIN) 5-325 MG tablet, One tablet every six hours as needed for acute pain.  Must last 7 days., Disp: 15 tablet, Rfl: 0   Allergies  Allergen Reactions   Peanuts [Nuts] Anaphylaxis   Penicillins Anaphylaxis    Has patient had a PCN reaction causing immediate rash, facial/tongue/throat swelling, SOB or lightheadedness with hypotension: Yes Has patient had a PCN reaction causing severe rash involving mucus membranes or skin necrosis: No Has patient had a PCN reaction that required hospitalization unknown Has patient had a PCN reaction occurring within the last 10 years: No If all of the above answers are "NO", then may proceed with Cephalosporin use.    Shellfish Allergy Anaphylaxis   Flexeril [Cyclobenzaprine Hcl] Other (See Comments)    Irregular  heartbeat, rash    Past Medical History:  Diagnosis Date   Asthma    Chronic back pain    Chronic neck pain    GERD (gastroesophageal reflux disease)    Hypertension      Past Surgical History:  Procedure Laterality Date   HERNIA REPAIR     thumb surgery     TONSILLECTOMY      Family History  Problem Relation Age of Onset   Hypertension Mother    Seizures Mother    Cancer Brother     Social History   Tobacco Use   Smoking status: Some Days    Packs/day: 0.01    Types: Cigarettes    Last attempt to quit: 04/11/2018    Years since quitting: 3.0   Smokeless tobacco: Never  Vaping Use   Vaping Use: Never used  Substance Use Topics   Alcohol use: Yes    Comment: occ   Drug use: No    ROS   Objective:   Vitals: BP (!) 144/89 (BP Location: Right Arm)    Pulse 72    Temp 98.4 F (36.9 C) (Oral)    Resp 18    Ht 6\' 2"  (1.88 m)    Wt 200 lb (90.7 kg)    SpO2 96%    BMI 25.68 kg/m   Physical Exam Constitutional:      General: He is not in acute distress.    Appearance: Normal appearance. He is well-developed and normal weight. He is not ill-appearing, toxic-appearing or diaphoretic.  HENT:  Head: Normocephalic and atraumatic.     Right Ear: Tympanic membrane, ear canal and external ear normal. There is no impacted cerumen.     Left Ear: Tympanic membrane, ear canal and external ear normal. There is no impacted cerumen.     Nose: No congestion or rhinorrhea.     Mouth/Throat:     Pharynx: Oropharyngeal exudate and posterior oropharyngeal erythema present.     Comments: Significant post-nasal drainage overlying pharynx.  There is also exudate bilaterally laterally over the oropharynx. Eyes:     General: No scleral icterus.       Right eye: No discharge.        Left eye: No discharge.     Extraocular Movements: Extraocular movements intact.     Conjunctiva/sclera: Conjunctivae normal.  Cardiovascular:     Rate and Rhythm: Normal rate and regular rhythm.      Heart sounds: Normal heart sounds. No murmur heard.   No friction rub. No gallop.  Pulmonary:     Effort: Pulmonary effort is normal. No respiratory distress.     Breath sounds: Normal breath sounds. No stridor. No wheezing, rhonchi or rales.  Musculoskeletal:     Cervical back: Normal range of motion and neck supple. No rigidity. No muscular tenderness.  Neurological:     General: No focal deficit present.     Mental Status: He is alert and oriented to person, place, and time.  Psychiatric:        Mood and Affect: Mood normal.        Behavior: Behavior normal.        Thought Content: Thought content normal.    Results for orders placed or performed during the hospital encounter of 04/28/21 (from the past 24 hour(s))  POCT rapid strep A     Status: None   Collection Time: 04/28/21  9:25 AM  Result Value Ref Range   Rapid Strep A Screen Negative Negative    Assessment and Plan :   PDMP not reviewed this encounter.  1. Acute pharyngitis, unspecified etiology   2. Throat pain   3. History of smoking   4. Subacute cough   5. History of asthma    Deferred imaging given clear cardiopulmonary exam, hemodynamically stable vital signs.  Patient has a severe reaction to penicillins including anaphylaxis and therefore have to avoid penicillins and cephalosporins.  Will treat empirically for pharyngitis given physical exam findings.  Patient is to start azithromycin, use supportive care otherwise. Counseled patient on potential for adverse effects with medications prescribed/recommended today, ER and return-to-clinic precautions discussed, patient verbalized understanding.   Jaynee Eagles, [place] 04/28/21 [phone]

## 2021-04-28 NOTE — ED Triage Notes (Signed)
Pt reports sore throat, cough,fever for last several days. Pt reports has been taking ibuprofen and otc robitussin. Pt reports emesis x1 this morning and continued nausea. Nad noted. White patches noted to right side of throat. Pt reports had tonsils and adenoids as a child.

## 2022-01-04 ENCOUNTER — Telehealth: Payer: Self-pay | Admitting: Nurse Practitioner

## 2022-01-04 ENCOUNTER — Ambulatory Visit (INDEPENDENT_AMBULATORY_CARE_PROVIDER_SITE_OTHER): Payer: 59 | Admitting: Nurse Practitioner

## 2022-01-04 ENCOUNTER — Ambulatory Visit (HOSPITAL_COMMUNITY)
Admission: RE | Admit: 2022-01-04 | Discharge: 2022-01-04 | Disposition: A | Discharge status: Home / Self Care | Payer: 59 | Source: Ambulatory Visit | Attending: Nurse Practitioner | Admitting: Nurse Practitioner

## 2022-01-04 VITALS — BP 131/87 | Temp 98.4°F | Wt 171.2 lb

## 2022-01-04 DIAGNOSIS — R059 Cough, unspecified: Secondary | ICD-10-CM | POA: Diagnosis not present

## 2022-01-04 DIAGNOSIS — J189 Pneumonia, unspecified organism: Secondary | ICD-10-CM | POA: Insufficient documentation

## 2022-01-04 DIAGNOSIS — R051 Acute cough: Secondary | ICD-10-CM | POA: Diagnosis not present

## 2022-01-04 DIAGNOSIS — R0602 Shortness of breath: Secondary | ICD-10-CM | POA: Diagnosis not present

## 2022-01-04 DIAGNOSIS — R079 Chest pain, unspecified: Secondary | ICD-10-CM | POA: Diagnosis not present

## 2022-01-04 MED ORDER — AMOXICILLIN 500 MG PO TABS: 1000.0000 mg | ORAL_TABLET | Freq: Three times a day (TID) | ORAL | 0 refills | Status: AC

## 2022-01-04 NOTE — Telephone Encounter (Addendum)
Telephone call- voicemail full - see CXR results

## 2022-01-04 NOTE — Telephone Encounter (Signed)
Patient returning call to the nurse.[phone]

## 2022-01-04 NOTE — Progress Notes (Signed)
   Subjective:    Patient ID: Eric Lowery, male    DOB: September 17, 1985, 36 y.o.   MRN: [id number]  HPI Patient presents to clinic today with complaints of shortness of breath, productive cough with thick green discharge, nasal congestion, chills, headache x10 days.  Patient denies any fevers or body aches or chest tightness.  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  Patient states that he just has not been able to get well.  Patient has been using albuterol inhaler but it has not been helpful.  Patient also using Mucinex and Tessalon Perles which have not been helpful.   Review of Systems  Constitutional:  Positive for chills.  HENT:  Positive for congestion.   Respiratory:  Positive for cough.   Neurological:  Positive for headaches.  All other systems reviewed and are negative.      Objective:   Physical Exam Vitals reviewed.  Constitutional:      General: He is not in acute distress.    Appearance: Normal appearance. He is normal weight. He is not ill-appearing, toxic-appearing or diaphoretic.  HENT:     Head: Normocephalic and atraumatic.  Cardiovascular:     Rate and Rhythm: Normal rate and regular rhythm.     Pulses: Normal pulses.     Heart sounds: Normal heart sounds. No murmur heard. Pulmonary:     Effort: Pulmonary effort is normal. No respiratory distress.     Breath sounds: Rales present. No wheezing.     Comments: Fine crackles noted to all lung fields. Musculoskeletal:     Cervical back: Normal range of motion and neck supple. No rigidity or tenderness.     Comments: Grossly intact  Lymphadenopathy:     Cervical: No cervical adenopathy.  Skin:    General: Skin is warm.     Capillary Refill: Capillary refill takes less than 2 seconds.  Neurological:     Mental Status: He is alert.     Comments: Grossly intact  Psychiatric:        Mood and Affect: Mood  normal.        Behavior: Behavior normal.         Assessment & Plan:   1. Pneumonia due to infectious organism, unspecified laterality, unspecified part of lung -Suspect pneumonia -Get chest x-ray but also treat with high-dose amoxicillin - DG Chest 2 View - amoxicillin (AMOXIL) 500 MG tablet; Take 2 tablets (1,000 mg total) by mouth in the morning, at noon, and at bedtime for 5 days.  Dispense: 30 tablet; Refill: 0 -Return to clinic in 1 week for reevaluation -If the symptoms worsen or do not improve go to emergency room for evaluation     Note:  This document was prepared using Dragon voice recognition software and may include unintentional dictation errors. Note - This record has been created using [employer].  Chart creation errors have been sought, but may not always  have been located. Such creation errors do not reflect on  the standard of medical care.

## 2022-01-06 NOTE — Telephone Encounter (Signed)
Pt returned call but nurse on the line with another patient. Returned call to patient and pt verbalized understanding.

## 2022-01-06 NOTE — Telephone Encounter (Signed)
Voicemail full. Sent my chart message

## 2022-03-03 ENCOUNTER — Encounter (HOSPITAL_COMMUNITY): Payer: Self-pay | Admitting: Emergency Medicine

## 2022-03-03 ENCOUNTER — Emergency Department (HOSPITAL_COMMUNITY): Payer: No Typology Code available for payment source

## 2022-03-03 ENCOUNTER — Emergency Department (HOSPITAL_COMMUNITY)
Admission: EM | Admit: 2022-03-03 | Discharge: 2022-03-03 | Disposition: A | Discharge status: Home / Self Care | Payer: No Typology Code available for payment source | Attending: Emergency Medicine | Admitting: Emergency Medicine

## 2022-03-03 ENCOUNTER — Other Ambulatory Visit: Payer: Self-pay

## 2022-03-03 DIAGNOSIS — S0990XA Unspecified injury of head, initial encounter: Secondary | ICD-10-CM | POA: Insufficient documentation

## 2022-03-03 DIAGNOSIS — Y92410 Unspecified street and highway as the place of occurrence of the external cause: Secondary | ICD-10-CM | POA: Insufficient documentation

## 2022-03-03 DIAGNOSIS — J341 Cyst and mucocele of nose and nasal sinus: Secondary | ICD-10-CM | POA: Diagnosis not present

## 2022-03-03 DIAGNOSIS — Y990 Civilian activity done for income or pay: Secondary | ICD-10-CM | POA: Insufficient documentation

## 2022-03-03 DIAGNOSIS — Z91010 Allergy to peanuts: Secondary | ICD-10-CM | POA: Insufficient documentation

## 2022-03-03 DIAGNOSIS — R519 Headache, unspecified: Secondary | ICD-10-CM | POA: Diagnosis not present

## 2022-03-03 DIAGNOSIS — M542 Cervicalgia: Secondary | ICD-10-CM | POA: Insufficient documentation

## 2022-03-03 DIAGNOSIS — S199XXA Unspecified injury of neck, initial encounter: Secondary | ICD-10-CM | POA: Diagnosis not present

## 2022-03-03 MED ORDER — IBUPROFEN 600 MG PO TABS: 600.0000 mg | ORAL_TABLET | Freq: Four times a day (QID) | ORAL | 0 refills | Status: DC | PRN

## 2022-03-03 MED ORDER — ACETAMINOPHEN 500 MG PO TABS: 500.0000 mg | ORAL_TABLET | Freq: Four times a day (QID) | ORAL | 0 refills | Status: DC | PRN

## 2022-03-03 MED ORDER — METHOCARBAMOL 500 MG PO TABS
1000.0000 mg | ORAL_TABLET | Freq: Once | ORAL | Status: AC
  Administered 2022-03-03: 1000 mg via ORAL
  Filled 2022-03-03: qty 2

## 2022-03-03 MED ORDER — METHOCARBAMOL 500 MG PO TABS: 500.0000 mg | ORAL_TABLET | Freq: Two times a day (BID) | ORAL | 0 refills | Status: DC

## 2022-03-03 MED ORDER — NAPROXEN 250 MG PO TABS
500.0000 mg | ORAL_TABLET | Freq: Once | ORAL | Status: AC
  Administered 2022-03-03: 500 mg via ORAL
  Filled 2022-03-03: qty 2

## 2022-03-03 MED ORDER — LIDOCAINE 5 % EX PTCH: 1.0000 | MEDICATED_PATCH | CUTANEOUS | 0 refills | Status: DC
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
## 2022-05-16 ENCOUNTER — Ambulatory Visit
Admission: EM | Admit: 2022-05-16 | Discharge: 2022-05-16 | Disposition: A | Discharge status: Home / Self Care | Payer: Commercial Managed Care - PPO | Attending: Internal Medicine | Admitting: Internal Medicine

## 2022-05-16 ENCOUNTER — Ambulatory Visit (INDEPENDENT_AMBULATORY_CARE_PROVIDER_SITE_OTHER): Payer: Commercial Managed Care - PPO

## 2022-05-16 DIAGNOSIS — M7989 Other specified soft tissue disorders: Secondary | ICD-10-CM | POA: Diagnosis not present

## 2022-05-16 DIAGNOSIS — S93401A Sprain of unspecified ligament of right ankle, initial encounter: Secondary | ICD-10-CM | POA: Diagnosis not present

## 2022-05-16 DIAGNOSIS — M25571 Pain in right ankle and joints of right foot: Secondary | ICD-10-CM

## 2022-05-16 MED ORDER — IBUPROFEN 800 MG PO TABS
800.0000 mg | ORAL_TABLET | Freq: Once | ORAL | Status: AC
  Administered 2022-05-16: 800 mg via ORAL

## 2022-05-16 NOTE — ED Triage Notes (Signed)
Patient states he fell off a 10 foot ladder this morning and landed on his right ankle. Is now having swelling and sharp pain in his right ankle. Patient states he is unable to put pressure/ walk without sharp pain. Taking tylenol and ibuprofen.

## 2022-05-16 NOTE — Discharge Instructions (Addendum)
Take Ibuprofen up to 800 mg three times a day for pain Follow with [facility] tomorrow, if you still can't raise your foot up.

## 2022-05-16 NOTE — ED Provider Notes (Signed)
[facility]    CSN: [id number] Arrival date & time: 05/16/22  1704      History   Chief Complaint Chief Complaint  Patient presents with   Ankle Pain    HPI Eric Lowery is a 37 y.o. male who presents with lateral left ankle pain and swelling which occurred today when he fell off the 6th level of his latter at home as he lost his balance and inverted his ankle. Has been applying ice on it directly on his skin, and this area is red and warm. Denies past hx of ankle injury.     Past Medical History:  Diagnosis Date   Asthma    Chronic back pain    Chronic neck pain    GERD (gastroesophageal reflux disease)    Hypertension     There are no problems to display for this patient.   Past Surgical History:  Procedure Laterality Date   HERNIA REPAIR     thumb surgery     TONSILLECTOMY         Home Medications    Prior to Admission medications   Medication Sig Start Date End Date Taking? Authorizing Provider  acetaminophen (TYLENOL) 500 MG tablet Take 1 tablet (500 mg total) by mouth every 6 (six) hours as needed. 03/03/22  Yes Lowery, Eric A, PA-C  ibuprofen (ADVIL) 600 MG tablet Take 1 tablet (600 mg total) by mouth every 6 (six) hours as needed. 03/03/22  Yes Lowery, Eric A, PA-C  lidocaine (LIDODERM) 5 % Place 1 patch onto the skin daily. Remove & Discard patch within 12 hours or as directed by MD 03/03/22   Lowery, Eric A, PA-C  methocarbamol (ROBAXIN) 500 MG tablet Take 1 tablet (500 mg total) by mouth 2 (two) times daily. 03/03/22   Lowery, Eric A, PA-C    Family History Family History  Problem Relation Age of Onset   Hypertension Mother    Seizures Mother    Cancer Brother     Social History Social History   Tobacco Use   Smoking status: Some Days    Packs/day: 0.01    Types: Cigarettes    Last attempt to quit: 04/11/2018    Years since quitting: 4.0   Smokeless tobacco: Never  Vaping Use   Vaping Use: Never used   Substance Use Topics   Alcohol use: Yes    Comment: occ   Drug use: No     Allergies   Peanuts [nuts], Penicillins, Shellfish allergy, and Flexeril [cyclobenzaprine hcl]   Review of Systems Review of Systems  Musculoskeletal:  Positive for arthralgias, gait problem and joint swelling. Negative for back pain.  Neurological:  Negative for numbness.     Physical Exam Triage Vital Signs ED Triage Vitals [05/16/22 1848]  Enc Vitals Group     BP 127/81     Pulse Rate 94     Resp 16     Temp (!) 97.5 F (36.4 C)     Temp Source Oral     SpO2 97 %     Weight      Height      Head Circumference      Peak Flow      Pain Score 10     Pain Loc      Pain Edu?      Excl. in [place]?    No data found.  Updated Vital Signs BP 127/81 (BP Location: Right Arm)   Pulse 94  Temp (!) 97.5 F (36.4 C) (Oral)   Resp 16   SpO2 97%   Visual Acuity Right Eye Distance:   Left Eye Distance:   Bilateral Distance:    Right Eye Near:   Left Eye Near:    Bilateral Near:     Physical Exam Constitutional:      General: He is not in acute distress.    Appearance: He is normal weight.  HENT:     Right Ear: External ear normal.     Left Ear: External ear normal.  Eyes:     General: No scleral icterus.    Conjunctiva/sclera: Conjunctivae normal.  Cardiovascular:     Pulses: Normal pulses.  Pulmonary:     Effort: Pulmonary effort is normal.  Musculoskeletal:     Cervical back: Neck supple.     Lumbar back: Tenderness present. No swelling, deformity, signs of trauma or lacerations. Normal range of motion. Negative right straight leg raise test and negative left straight leg raise test.       Back:     Right ankle: Swelling present. No deformity, ecchymosis or lacerations. Tenderness present over the lateral malleolus. Decreased range of motion. Normal pulse.     Right Achilles Tendon: No tenderness or defects. Thompson's test negative.     Comments: He is unable to dorsiflex his  R  foot up, has decreased inversion and eversion due to pain   Skin:    General: Skin is warm and dry.     Findings: Erythema present.     Comments: Has warmth and erythema on skin on lateral ankle and lateral lower leg  Neurological:     Mental Status: He is alert and oriented to person, place, and time.     Gait: Gait abnormal.     Deep Tendon Reflexes: Reflexes normal.  Psychiatric:        Mood and Affect: Mood normal.        Behavior: Behavior normal.        Thought Content: Thought content normal.        Judgment: Judgment normal.      UC Treatments / Results  Labs (all labs ordered are listed, but only abnormal results are displayed) Labs Reviewed - No data to display  EKG   Radiology DG Ankle Complete Right  Result Date: 05/16/2022 CLINICAL DATA:  Ankle pain EXAM: RIGHT ANKLE - COMPLETE 3+ VIEW COMPARISON:  None Available. FINDINGS: There is no evidence of fracture, dislocation, or joint effusion. There is no evidence of arthropathy or other focal bone abnormality. There is some lateral soft tissue swelling. IMPRESSION: Lateral soft tissue swelling. No fracture or dislocation. Electronically Signed   By: Eric Lowery M.D.   On: 05/16/2022 19:18    Procedures Procedures (including critical care time)  Medications Ordered in UC Medications  ibuprofen (ADVIL) tablet 800 mg (has no administration in time range)    Initial Impression / Assessment and Plan / UC Course  I have reviewed the triage vital signs and the nursing notes.  Pertinent  imaging results that were available during my care of the patient were reviewed by me and considered in my medical decision making (see chart for details).  R ankle sprain  He was given Ibuprofen 800 mg PO here Placed on a walker boot  Needs to FU with ortho tomorrow, if he still can't dorsiflex his L foot.    Final Clinical Impressions(s) / UC Diagnoses   Final diagnoses:  Mild ankle sprain,  right, initial encounter      Discharge Instructions      Take Ibuprofen up to 800 mg three times a day for pain Follow with [facility] tomorrow, if you still can't raise your foot up.     ED Prescriptions   None    I have reviewed the PDMP during this encounter.   Eric Lowery, Cordelia Poche 05/16/22 1956

## 2022-05-17 DIAGNOSIS — M25571 Pain in right ankle and joints of right foot: Secondary | ICD-10-CM | POA: Diagnosis not present

## 2022-05-25 DIAGNOSIS — M25571 Pain in right ankle and joints of right foot: Secondary | ICD-10-CM | POA: Diagnosis not present

## 2022-06-16 ENCOUNTER — Encounter: Payer: Self-pay | Admitting: Radiology

## 2022-08-26 ENCOUNTER — Encounter: Payer: Self-pay | Admitting: Emergency Medicine

## 2022-08-26 ENCOUNTER — Other Ambulatory Visit: Payer: Self-pay

## 2022-08-26 ENCOUNTER — Ambulatory Visit
Admission: EM | Admit: 2022-08-26 | Discharge: 2022-08-26 | Disposition: A | Discharge status: Home / Self Care | Payer: Commercial Managed Care - PPO

## 2022-08-26 DIAGNOSIS — J209 Acute bronchitis, unspecified: Secondary | ICD-10-CM

## 2022-08-26 MED ORDER — IPRATROPIUM-ALBUTEROL 0.5-2.5 (3) MG/3ML IN SOLN
3.0000 mL | Freq: Once | RESPIRATORY_TRACT | Status: AC
  Administered 2022-08-26: 3 mL via RESPIRATORY_TRACT

## 2022-08-26 MED ORDER — METHYLPREDNISOLONE 4 MG PO TBPK: ORAL_TABLET | ORAL | 0 refills | Status: DC

## 2022-08-26 MED ORDER — BENZONATATE 200 MG PO CAPS: 200.0000 mg | ORAL_CAPSULE | Freq: Three times a day (TID) | ORAL | 0 refills | Status: DC | PRN

## 2022-08-26 MED ORDER — DOXYCYCLINE HYCLATE 100 MG PO TBEC: 100.0000 mg | DELAYED_RELEASE_TABLET | Freq: Two times a day (BID) | ORAL | 0 refills | Status: DC

## 2022-08-26 NOTE — ED Provider Notes (Signed)
[facility]    CSN: [id number] Arrival date & time: 08/26/22  1224      History   Chief Complaint Chief Complaint  Patient presents with   Cough    HPI Eric Lowery is a 37 y.o. male with hx of HTN, asthma and who is a smoker presents with onset of productive cough with green mucous, nose congestion, and face pressure x 3 days. His inhaler has not been helping with his cough. Denies fever. He stopped smoking last week. Has been dealing with allergies in the past 2 weeks and been taking Claritin D. Has been having really bad cough attacks.     Past Medical History:  Diagnosis Date   Asthma    Chronic back pain    Chronic neck pain    GERD (gastroesophageal reflux disease)    Hypertension     There are no problems to display for this patient.   Past Surgical History:  Procedure Laterality Date   HERNIA REPAIR     thumb surgery     TONSILLECTOMY         Home Medications    Prior to Admission medications   Medication Sig Start Date End Date Taking? Authorizing Provider  albuterol (VENTOLIN HFA) 108 (90 Base) MCG/ACT inhaler Inhale into the lungs every 6 (six) hours as needed for wheezing or shortness of breath.   Yes [provider]  benzonatate (TESSALON) 200 MG capsule Take 1 capsule (200 mg total) by mouth 3 (three) times daily as needed for cough. 08/26/22  Yes Rodriguez-Southworth, Nettie Elm, PA-C  doxycycline (DORYX) 100 MG EC tablet Take 1 tablet (100 mg total) by mouth 2 (two) times daily. 08/26/22  Yes Rodriguez-Southworth, Nettie Elm, PA-C  methylPREDNISolone (MEDROL DOSEPAK) 4 MG TBPK tablet Take as directed 08/26/22  Yes Rodriguez-Southworth, Nettie Elm, PA-C  propranolol (INDERAL) 80 MG tablet Take 80 mg by mouth once.   Yes [provider]  acetaminophen (TYLENOL) 500 MG tablet Take 1 tablet (500 mg total) by mouth every 6 (six) hours as needed. 03/03/22   Redwine, Madison A, PA-C  ibuprofen (ADVIL) 600 MG tablet Take 1 tablet (600 mg  total) by mouth every 6 (six) hours as needed. 03/03/22   Redwine, Madison A, PA-C    Family History Family History  Problem Relation Age of Onset   Hypertension Mother    Seizures Mother    Cancer Brother     Social History Social History   Tobacco Use   Smoking status: Some Days    Packs/day: .01    Types: Cigarettes    Last attempt to quit: 04/11/2018    Years since quitting: 4.3   Smokeless tobacco: Never  Vaping Use   Vaping Use: Never used  Substance Use Topics   Alcohol use: Yes    Comment: occ   Drug use: No     Allergies   Peanuts [nuts], Penicillins, Shellfish allergy, and Flexeril [cyclobenzaprine hcl]   Review of Systems Review of Systems As noted in HPI  Physical Exam Triage Vital Signs ED Triage Vitals  Enc Vitals Group     BP      Pulse      Resp      Temp      Temp src      SpO2      Weight      Height      Head Circumference      Peak Flow      Pain Score  Pain Loc      Pain Edu?      Excl. in [place]?    No data found.  Updated Vital Signs BP 138/89 (BP Location: Right Arm)   Pulse 83   Temp 98.6 F (37 C) (Oral)   Resp 20   SpO2 98%   Visual Acuity Right Eye Distance:   Left Eye Distance:   Bilateral Distance:    Right Eye Near:   Left Eye Near:    Bilateral Near:     Physical Exam Physical Exam Constitutional:      General: He is not in acute distress.    Appearance: He is not toxic-appearing.  HENT:     Head: Normocephalic.     Right Ear: Tympanic membrane, ear canal and external ear normal.     Left Ear: Ear canal and external ear normal.     Nose: Nose normal.     Mouth/Throat:     Mouth: Mucous membranes are moist.     Pharynx: Oropharynx is clear.  Eyes:     General: No scleral icterus.    Conjunctiva/sclera: Conjunctivae normal.  Cardiovascular:     Rate and Rhythm: Normal rate and regular rhythm.     Heart sounds: No murmur heard.   Pulmonary:     Effort: Pulmonary effort is normal. No  respiratory distress.     Breath sounds: mild Wheezing present on LLL, but resolved after duo neb.   Musculoskeletal:        General: Normal range of motion.     Cervical back: Neck supple.  Lymphadenopathy:     Cervical: No cervical adenopathy.  Skin:    General: Skin is warm and dry.     Findings: No rash.  Neurological:     Mental Status: He is alert and oriented to person, place, and time.     Gait: Gait normal.  Psychiatric:        Mood and Affect: Mood normal.        Behavior: Behavior normal.        Thought Content: Thought content normal.        Judgment: Judgment normal.    UC Treatments / Results  Labs (all labs ordered are listed, but only abnormal results are displayed) Labs Reviewed - No data to display  EKG   Radiology No results found.  Procedures Procedures (including critical care time)  Medications Ordered in UC Medications  ipratropium-albuterol (DUONEB) 0.5-2.5 (3) MG/3ML nebulizer solution 3 mL (3 mLs Nebulization Given 08/26/22 1439)    Initial Impression / Assessment and Plan / UC Course  I have reviewed the triage vital signs and the nursing notes. Pt was given a duo neb treatment and his cough attacks resolved and felt much better at discharge.  Acute Bronchitis with bronchospasm I placed him on Doxy and Prednisone and Tessalon as noted.  Final Clinical Impressions(s) / UC Diagnoses   Final diagnoses:  Acute bronchitis, unspecified organism     Discharge Instructions      Come back if you develop a fever of 100.5 or more and the cough gets worse.      ED Prescriptions     Medication Sig Dispense Auth. Provider   doxycycline (DORYX) 100 MG EC tablet Take 1 tablet (100 mg total) by mouth 2 (two) times daily. 20 tablet Rodriguez-Southworth, Donielle Radziewicz, PA-C   methylPREDNISolone (MEDROL DOSEPAK) 4 MG TBPK tablet Take as directed 21 tablet Rodriguez-Southworth, Candise Crabtree, PA-C   benzonatate (TESSALON) 200 MG capsule  Take 1 capsule (200 mg  total) by mouth 3 (three) times daily as needed for cough. 30 capsule Rodriguez-Southworth, Nettie Elm, PA-C      PDMP not reviewed this encounter.   [name], [place] 08/26/22 1743

## 2022-08-26 NOTE — Discharge Instructions (Addendum)
Come back if you develop a fever of 100.5 or more and the cough gets worse.

## 2022-08-26 NOTE — ED Triage Notes (Addendum)
Pt reports intermittent productive cough, nasal congestion, facial pressure x3 days. Denies any known fevers. Inhaler has not helped with symptoms.

## 2022-11-10 DIAGNOSIS — G4700 Insomnia, unspecified: Secondary | ICD-10-CM | POA: Diagnosis not present

## 2022-11-10 DIAGNOSIS — F419 Anxiety disorder, unspecified: Secondary | ICD-10-CM | POA: Diagnosis not present

## 2022-11-10 DIAGNOSIS — F1120 Opioid dependence, uncomplicated: Secondary | ICD-10-CM | POA: Diagnosis not present

## 2022-11-17 DIAGNOSIS — F1120 Opioid dependence, uncomplicated: Secondary | ICD-10-CM | POA: Diagnosis not present

## 2022-11-17 DIAGNOSIS — G4700 Insomnia, unspecified: Secondary | ICD-10-CM | POA: Diagnosis not present

## 2022-11-17 DIAGNOSIS — F419 Anxiety disorder, unspecified: Secondary | ICD-10-CM | POA: Diagnosis not present

## 2022-11-24 DIAGNOSIS — F1120 Opioid dependence, uncomplicated: Secondary | ICD-10-CM | POA: Diagnosis not present

## 2022-11-24 DIAGNOSIS — G4700 Insomnia, unspecified: Secondary | ICD-10-CM | POA: Diagnosis not present

## 2022-11-24 DIAGNOSIS — F1419 Cocaine abuse with unspecified cocaine-induced disorder: Secondary | ICD-10-CM | POA: Diagnosis not present

## 2023-01-05 ENCOUNTER — Other Ambulatory Visit: Payer: Self-pay

## 2023-01-05 ENCOUNTER — Encounter (HOSPITAL_COMMUNITY): Payer: Self-pay | Admitting: Emergency Medicine

## 2023-01-05 ENCOUNTER — Emergency Department (HOSPITAL_COMMUNITY)
Admission: EM | Admit: 2023-01-05 | Discharge: 2023-01-05 | Disposition: A | Discharge status: Home / Self Care | Payer: Commercial Managed Care - PPO

## 2023-01-05 DIAGNOSIS — R000 Tachycardia, unspecified: Secondary | ICD-10-CM | POA: Insufficient documentation

## 2023-01-05 DIAGNOSIS — F1193 Opioid use, unspecified with withdrawal: Secondary | ICD-10-CM | POA: Diagnosis not present

## 2023-01-05 DIAGNOSIS — Z0279 Encounter for issue of other medical certificate: Secondary | ICD-10-CM | POA: Diagnosis present

## 2023-01-05 DIAGNOSIS — F1123 Opioid dependence with withdrawal: Secondary | ICD-10-CM | POA: Diagnosis not present

## 2023-01-05 DIAGNOSIS — Z91010 Allergy to peanuts: Secondary | ICD-10-CM | POA: Insufficient documentation

## 2023-01-05 LAB — CBC
HCT: 46.2 % (ref 39.0–52.0)
Hemoglobin: 15.4 g/dL (ref 13.0–17.0)
MCH: 29.4 pg (ref 26.0–34.0)
MCHC: 33.3 g/dL (ref 30.0–36.0)
MCV: 88.3 fL (ref 80.0–100.0)
Platelets: 334 10*3/uL (ref 150–400)
RBC: 5.23 MIL/uL (ref 4.22–5.81)
RDW: 12.7 % (ref 11.5–15.5)
WBC: 8.7 10*3/uL (ref 4.0–10.5)
nRBC: 0 % (ref 0.0–0.2)

## 2023-01-05 LAB — COMPREHENSIVE METABOLIC PANEL
ALT: 16 U/L (ref 0–44)
AST: 20 U/L (ref 15–41)
Albumin: 4.5 g/dL (ref 3.5–5.0)
Alkaline Phosphatase: 79 U/L (ref 38–126)
Anion gap: 11 (ref 5–15)
BUN: 13 mg/dL (ref 6–20)
CO2: 26 mmol/L (ref 22–32)
Calcium: 10 mg/dL (ref 8.9–10.3)
Chloride: 101 mmol/L (ref 98–111)
Creatinine, Ser: 0.91 mg/dL (ref 0.61–1.24)
GFR, Estimated: >60 mL/min (ref >60)
Glucose, Bld: 115 mg/dL — ABNORMAL HIGH (ref 70–99)
Potassium: 4.2 mmol/L (ref 3.5–5.1)
Sodium: 138 mmol/L (ref 135–145)
Total Bilirubin: 0.9 mg/dL (ref 0.3–1.2)
Total Protein: 7.7 g/dL (ref 6.5–8.1)

## 2023-01-05 LAB — ETHANOL: Alcohol, Ethyl (B): <10 mg/dL (ref <10)

## 2023-01-05 LAB — RAPID URINE DRUG SCREEN, HOSP PERFORMED
Amphetamines: POSITIVE — AB
Barbiturates: NOT DETECTED
Benzodiazepines: NOT DETECTED
Cocaine: POSITIVE — AB
Opiates: NOT DETECTED
Tetrahydrocannabinol: POSITIVE — AB

## 2023-01-05 MED ORDER — LORAZEPAM 2 MG/ML IJ SOLN
2.0000 mg | Freq: Once | INTRAMUSCULAR | Status: AC
  Administered 2023-01-05: 2 mg via INTRAVENOUS
  Filled 2023-01-05: qty 1

## 2023-01-05 MED ORDER — LORAZEPAM 1 MG PO TABS: 1.0000 mg | ORAL_TABLET | Freq: Once | ORAL | Status: DC

## 2023-01-05 MED ORDER — SODIUM CHLORIDE 0.9 % IV BOLUS
1000.0000 mL | Freq: Once | INTRAVENOUS | Status: AC
  Administered 2023-01-05: 1000 mL via INTRAVENOUS

## 2023-01-05 NOTE — ED Notes (Signed)
Pt informed of urine specimen. States after 6 hours of not using, he experiences "really bad withdraw symptoms"

## 2023-01-05 NOTE — ED Triage Notes (Signed)
Pt sent from [facility] seeking detox from IV Fentanyl use, denies SI/HI.

## 2023-01-05 NOTE — ED Notes (Signed)
Aunt, Layne Benton, would like to be called and updated on plan of care for pt. Aunt has been at bedside with pt since arrival to ED.

## 2023-01-05 NOTE — Discharge Instructions (Signed)
You are medically cleared.  Please follow-up with [facility] to continue your rehabilitation.  You may return to the emergency department for any worsening symptoms.

## 2023-01-05 NOTE — ED Provider Notes (Signed)
[place] EMERGENCY DEPARTMENT AT [facility] Provider Note   CSN: [id number] Arrival date & time: 01/05/23  1330     History No chief complaint on file.   Eric Lowery is a 37 y.o. male patient who presents to the emergency department today for further evaluation of medical clearance.  Patient has been using IV fentanyl daily for the last 2 months.  Last use at 1 AM this morning.  This is his first time attending to get help.  [facility] sent the patient here for medical clearance.  Patient describes pain all over.  HPI     Home Medications Prior to Admission medications   Medication Sig Start Date End Date Taking? Authorizing Provider  acetaminophen (TYLENOL) 500 MG tablet Take 1 tablet (500 mg total) by mouth every 6 (six) hours as needed. 03/03/22  Yes Redwine, Madison A, PA-C  albuterol (VENTOLIN HFA) 108 (90 Base) MCG/ACT inhaler Inhale into the lungs every 6 (six) hours as needed for wheezing or shortness of breath.   Yes [provider]  propranolol (INDERAL) 80 MG tablet Take 80 mg by mouth once.   Yes [provider]  QUEtiapine (SEROQUEL) 25 MG tablet Take 25 mg by mouth at bedtime. 11/10/22  Yes [provider]  sertraline (ZOLOFT) 25 MG tablet Take 12.5 mg by mouth daily. 11/17/22  Yes [provider]      Allergies    Peanuts [nuts], Penicillins, Shellfish allergy, and Cyclobenzaprine hcl    Review of Systems   Review of Systems  All other systems reviewed and are negative.   Physical Exam Updated Vital Signs BP (!) 148/80 (BP Location: Left Arm)   Pulse 84   Temp 98.1 F (36.7 C) (Oral)   Resp 19   Ht 6\' 1"  (1.854 m)   Wt 83.9 kg   SpO2 99%   BMI 24.41 kg/m  Physical Exam Vitals and nursing note reviewed.  Constitutional:      General: He is not in acute distress.    Appearance: Normal appearance.  HENT:     Head: Normocephalic and atraumatic.  Eyes:     General:        Right eye: No discharge.         Left eye: No discharge.  Cardiovascular:     Rate and Rhythm: Regular rhythm. Tachycardia present.     Heart sounds: Normal heart sounds.  Pulmonary:     Comments: Clear to auscultation bilaterally.  Normal effort.  No respiratory distress.  No evidence of wheezes, rales, or rhonchi heard throughout. Abdominal:     General: Abdomen is flat. Bowel sounds are normal. There is no distension.     Tenderness: There is no abdominal tenderness. There is no guarding or rebound.  Musculoskeletal:        General: Normal range of motion.     Cervical back: Neck supple.  Skin:    General: Skin is warm and dry.     Findings: No rash.  Neurological:     General: No focal deficit present.     Mental Status: He is alert.  Psychiatric:        Mood and Affect: Mood normal.        Behavior: Behavior normal.     ED Results / Procedures / Treatments   Labs (all labs ordered are listed, but only abnormal results are displayed) Labs Reviewed  COMPREHENSIVE METABOLIC PANEL - Abnormal; Notable for the following components:  Result Value   Glucose, Bld 115 (*)    All other components within normal limits  RAPID URINE DRUG SCREEN, HOSP PERFORMED - Abnormal; Notable for the following components:   Cocaine POSITIVE (*)    Amphetamines POSITIVE (*)    Tetrahydrocannabinol POSITIVE (*)    All other components within normal limits  ETHANOL  CBC    EKG None  Radiology No results found.  Procedures Procedures    Medications Ordered in ED Medications  sodium chloride 0.9 % bolus 1,000 mL (0 mLs Intravenous Stopped 01/05/23 1732)  LORazepam (ATIVAN) injection 2 mg (2 mg Intravenous Given 01/05/23 1655)    ED Course/ Medical Decision Making/ A&P Clinical Course as of 01/05/23 1759  Thu Jan 05, 2023  1715 cbc Normal.  [CF]  1715 Comprehensive metabolic panel(!) Normal.  [CF]  1715 Ethanol Negative.  [CF]  1716 Rapid urine drug screen (hospital performed)(!) Positive for cocaine,  amphetamines, and THC.  [CF]    Clinical Course User Index [CF] Teressa Lower, PA-C   {   Click here for ABCD2, HEART and other calculators  Medical Decision Making Eric Lowery is a 37 y.o. male patient who presents to the emergency permit today for medical clearance.  Patient arrives slightly hypertensive and tachycardic likely from opioid withdrawal.  Will plan to get him comfortable and get medical screening labs.  Will plan to give him Ativan.  Patient's labs are reassuring.  Vital signs have improved.  He is medically cleared to follow-up with [facility]. Strict return precautions given. He is safe for discharge.   Amount and/or Complexity of Data Reviewed Labs: ordered. Decision-making details documented in ED Course.  Risk Prescription drug management.    Final Clinical Impression(s) / ED Diagnoses Final diagnoses:  Opioid withdrawal [facility])    Rx / DC Orders ED Discharge Orders     None         Teressa Lower, [place] 01/05/23 1759    Bethann Berkshire, MD 01/07/23 1257

## 2023-01-30 ENCOUNTER — Emergency Department (HOSPITAL_COMMUNITY)
Admission: EM | Admit: 2023-01-30 | Discharge: 2023-01-31 | Disposition: A | Discharge status: Home / Self Care | Payer: Commercial Managed Care - PPO | Attending: Emergency Medicine | Admitting: Emergency Medicine

## 2023-01-30 ENCOUNTER — Emergency Department (HOSPITAL_COMMUNITY): Payer: Commercial Managed Care - PPO

## 2023-01-30 ENCOUNTER — Other Ambulatory Visit: Payer: Self-pay

## 2023-01-30 ENCOUNTER — Encounter (HOSPITAL_COMMUNITY): Payer: Self-pay

## 2023-01-30 DIAGNOSIS — J45909 Unspecified asthma, uncomplicated: Secondary | ICD-10-CM | POA: Diagnosis not present

## 2023-01-30 DIAGNOSIS — Z91010 Allergy to peanuts: Secondary | ICD-10-CM | POA: Insufficient documentation

## 2023-01-30 DIAGNOSIS — R7309 Other abnormal glucose: Secondary | ICD-10-CM | POA: Insufficient documentation

## 2023-01-30 DIAGNOSIS — R0789 Other chest pain: Secondary | ICD-10-CM | POA: Diagnosis not present

## 2023-01-30 DIAGNOSIS — I10 Essential (primary) hypertension: Secondary | ICD-10-CM | POA: Insufficient documentation

## 2023-01-30 DIAGNOSIS — F19939 Other psychoactive substance use, unspecified with withdrawal, unspecified: Secondary | ICD-10-CM | POA: Diagnosis not present

## 2023-01-30 DIAGNOSIS — F1410 Cocaine abuse, uncomplicated: Secondary | ICD-10-CM | POA: Insufficient documentation

## 2023-01-30 DIAGNOSIS — F17200 Nicotine dependence, unspecified, uncomplicated: Secondary | ICD-10-CM | POA: Insufficient documentation

## 2023-01-30 DIAGNOSIS — R0602 Shortness of breath: Secondary | ICD-10-CM | POA: Diagnosis not present

## 2023-01-30 DIAGNOSIS — F1910 Other psychoactive substance abuse, uncomplicated: Secondary | ICD-10-CM | POA: Diagnosis not present

## 2023-01-30 DIAGNOSIS — R072 Precordial pain: Secondary | ICD-10-CM | POA: Diagnosis not present

## 2023-01-30 DIAGNOSIS — T50904A Poisoning by unspecified drugs, medicaments and biological substances, undetermined, initial encounter: Secondary | ICD-10-CM | POA: Diagnosis not present

## 2023-01-30 DIAGNOSIS — R079 Chest pain, unspecified: Secondary | ICD-10-CM | POA: Diagnosis not present

## 2023-01-30 LAB — CBC WITH DIFFERENTIAL/PLATELET
Abs Immature Granulocytes: 0.04 10*3/uL (ref 0.00–0.07)
Basophils Absolute: 0 10*3/uL (ref 0.0–0.1)
Basophils Relative: 0 %
Eosinophils Absolute: 0.1 10*3/uL (ref 0.0–0.5)
Eosinophils Relative: 1 %
HCT: 39.6 % (ref 39.0–52.0)
Hemoglobin: 13.3 g/dL (ref 13.0–17.0)
Immature Granulocytes: 0 %
Lymphocytes Relative: 11 %
Lymphs Abs: 1.4 10*3/uL (ref 0.7–4.0)
MCH: 29.2 pg (ref 26.0–34.0)
MCHC: 33.6 g/dL (ref 30.0–36.0)
MCV: 86.8 fL (ref 80.0–100.0)
Monocytes Absolute: 1 10*3/uL (ref 0.1–1.0)
Monocytes Relative: 8 %
Neutro Abs: 9.7 10*3/uL — ABNORMAL HIGH (ref 1.7–7.7)
Neutrophils Relative %: 80 %
Platelets: 344 10*3/uL (ref 150–400)
RBC: 4.56 MIL/uL (ref 4.22–5.81)
RDW: 12.3 % (ref 11.5–15.5)
WBC: 12.2 10*3/uL — ABNORMAL HIGH (ref 4.0–10.5)
nRBC: 0 % (ref 0.0–0.2)

## 2023-01-30 LAB — RAPID URINE DRUG SCREEN, HOSP PERFORMED
Amphetamines: NOT DETECTED
Barbiturates: NOT DETECTED
Benzodiazepines: POSITIVE — AB
Cocaine: POSITIVE — AB
Opiates: NOT DETECTED
Tetrahydrocannabinol: NOT DETECTED

## 2023-01-30 LAB — COMPREHENSIVE METABOLIC PANEL
ALT: 14 U/L (ref 0–44)
AST: 21 U/L (ref 15–41)
Albumin: 4.1 g/dL (ref 3.5–5.0)
Alkaline Phosphatase: 79 U/L (ref 38–126)
Anion gap: 8 (ref 5–15)
BUN: 16 mg/dL (ref 6–20)
CO2: 27 mmol/L (ref 22–32)
Calcium: 8.9 mg/dL (ref 8.9–10.3)
Chloride: 100 mmol/L (ref 98–111)
Creatinine, Ser: 0.83 mg/dL (ref 0.61–1.24)
GFR, Estimated: >60 mL/min (ref >60)
Glucose, Bld: 100 mg/dL — ABNORMAL HIGH (ref 70–99)
Potassium: 4.3 mmol/L (ref 3.5–5.1)
Sodium: 135 mmol/L (ref 135–145)
Total Bilirubin: 0.4 mg/dL (ref 0.3–1.2)
Total Protein: 7 g/dL (ref 6.5–8.1)

## 2023-01-30 LAB — TROPONIN I (HIGH SENSITIVITY): Troponin I (High Sensitivity): <2 ng/L (ref <18)

## 2023-01-30 LAB — CBG MONITORING, ED: Glucose-Capillary: 103 mg/dL — ABNORMAL HIGH (ref 70–99)

## 2023-01-30 NOTE — ED Provider Notes (Signed)
EMERGENCY DEPARTMENT AT [facility] Provider Note   CSN: [id number] Arrival date & time: 01/30/23  2042     History  Chief Complaint  Patient presents with  . Withdrawal    Eric Lowery is a 37 y.o. male.  Patient sent by EMS from [facility].  Patient was partying earlier in [place].  Admits to using cocaine.  He did mention to them fentanyl and Suboxone.  He developed chest pain about an hour ago that is substernal.  Associated with nausea.  Past medical history in for hypertension chronic back pain gastroesophageal reflux disease and chronic neck pain.  Also history of asthma.  Patient is a smoker.  Admits to using cocaine.      Home Medications Prior to Admission medications   Medication Sig Start Date End Date Taking? Authorizing Provider  acetaminophen (TYLENOL) 500 MG tablet Take 1 tablet (500 mg total) by mouth every 6 (six) hours as needed. 03/03/22   Redwine, Madison A, PA-C  albuterol (VENTOLIN HFA) 108 (90 Base) MCG/ACT inhaler Inhale into the lungs every 6 (six) hours as needed for wheezing or shortness of breath.    [provider]  propranolol (INDERAL) 80 MG tablet Take 80 mg by mouth once.    [provider]  QUEtiapine (SEROQUEL) 25 MG tablet Take 25 mg by mouth at bedtime. 11/10/22   [provider]  sertraline (ZOLOFT) 25 MG tablet Take 12.5 mg by mouth daily. 11/17/22   [provider]      Allergies    Peanuts [nuts], Penicillins, Shellfish allergy, and Cyclobenzaprine hcl    Review of Systems   Review of Systems  Constitutional:  Negative for chills and fever.  HENT:  Negative for ear pain and sore throat.   Eyes:  Negative for pain and visual disturbance.  Respiratory:  Negative for cough and shortness of breath.   Cardiovascular:  Positive for chest pain. Negative for palpitations.  Gastrointestinal:  Positive for nausea. Negative for abdominal pain and vomiting.  Genitourinary:  Negative for  dysuria and hematuria.  Musculoskeletal:  Negative for arthralgias and back pain.  Skin:  Negative for color change and rash.  Neurological:  Negative for seizures and syncope.  All other systems reviewed and are negative.   Physical Exam Updated Vital Signs BP (!) 137/102   Pulse 91   Temp 97.9 F (36.6 C) (Oral)   Resp 17   Ht 1.854 m (6\' 1" )   Wt 81.6 kg   SpO2 98%   BMI 23.75 kg/m  Physical Exam Vitals and nursing note reviewed.  Constitutional:      General: He is not in acute distress.    Appearance: Normal appearance. He is well-developed.  HENT:     Head: Normocephalic and atraumatic.  Eyes:     Extraocular Movements: Extraocular movements intact.     Conjunctiva/sclera: Conjunctivae normal.     Pupils: Pupils are equal, round, and reactive to light.  Cardiovascular:     Rate and Rhythm: Normal rate and regular rhythm.     Heart sounds: No murmur heard. Pulmonary:     Effort: Pulmonary effort is normal. No respiratory distress.     Breath sounds: Normal breath sounds.  Abdominal:     Palpations: Abdomen is soft.     Tenderness: There is no abdominal tenderness.  Musculoskeletal:        General: No swelling.     Cervical back: Neck supple.     Right lower  leg: No edema.     Left lower leg: No edema.  Skin:    General: Skin is warm and dry.     Capillary Refill: Capillary refill takes less than 2 seconds.  Neurological:     General: No focal deficit present.     Mental Status: He is alert and oriented to person, place, and time.  Psychiatric:        Mood and Affect: Mood normal.    ED Results / Procedures / Treatments   Labs (all labs ordered are listed, but only abnormal results are displayed) Labs Reviewed  CBC WITH DIFFERENTIAL/PLATELET  COMPREHENSIVE METABOLIC PANEL  RAPID URINE DRUG SCREEN, HOSP PERFORMED  CBG MONITORING, ED  TROPONIN I (HIGH SENSITIVITY)    EKG None  Radiology No results found.  Procedures Procedures     Medications Ordered in ED Medications - No data to display  ED Course/ Medical Decision Making/ A&P                                 Medical Decision Making Amount and/or Complexity of Data Reviewed Labs: ordered. Radiology: ordered.   Will get chest x-ray will get troponins will get basic labs.  Evaluate for cocaine related chest pain.   Final Clinical Impression(s) / ED Diagnoses Final diagnoses:  None    Rx / DC Orders ED Discharge Orders     None         Vanetta Mulders, MD 02/03/23 [id number]

## 2023-01-30 NOTE — ED Triage Notes (Signed)
Pt BIB [facility] from [facility] for eval of withdrawal sxs. Pt took cocaine, fentanyl, and suboxone today around 1500. Per EMS, pt was dozing off in truck en route.  96 CBG 160/110 82HR 99% RA

## 2023-01-31 ENCOUNTER — Emergency Department (HOSPITAL_COMMUNITY): Payer: Commercial Managed Care - PPO

## 2023-01-31 ENCOUNTER — Emergency Department (HOSPITAL_COMMUNITY)
Admission: EM | Admit: 2023-01-31 | Discharge: 2023-01-31 | Disposition: A | Discharge status: Home / Self Care | Payer: Commercial Managed Care - PPO | Source: Home / Self Care | Attending: Emergency Medicine | Admitting: Emergency Medicine

## 2023-01-31 ENCOUNTER — Encounter (HOSPITAL_COMMUNITY): Payer: Self-pay | Admitting: *Deleted

## 2023-01-31 DIAGNOSIS — R0602 Shortness of breath: Secondary | ICD-10-CM | POA: Diagnosis not present

## 2023-01-31 DIAGNOSIS — R0902 Hypoxemia: Secondary | ICD-10-CM | POA: Diagnosis not present

## 2023-01-31 DIAGNOSIS — R072 Precordial pain: Secondary | ICD-10-CM | POA: Diagnosis not present

## 2023-01-31 DIAGNOSIS — Z91010 Allergy to peanuts: Secondary | ICD-10-CM | POA: Insufficient documentation

## 2023-01-31 DIAGNOSIS — F1910 Other psychoactive substance abuse, uncomplicated: Secondary | ICD-10-CM | POA: Insufficient documentation

## 2023-01-31 DIAGNOSIS — R0789 Other chest pain: Secondary | ICD-10-CM | POA: Diagnosis not present

## 2023-01-31 DIAGNOSIS — T887XXA Unspecified adverse effect of drug or medicament, initial encounter: Secondary | ICD-10-CM | POA: Diagnosis not present

## 2023-01-31 DIAGNOSIS — F1410 Cocaine abuse, uncomplicated: Secondary | ICD-10-CM | POA: Insufficient documentation

## 2023-01-31 DIAGNOSIS — R410 Disorientation, unspecified: Secondary | ICD-10-CM | POA: Diagnosis not present

## 2023-01-31 DIAGNOSIS — R531 Weakness: Secondary | ICD-10-CM | POA: Diagnosis not present

## 2023-01-31 LAB — CBC WITH DIFFERENTIAL/PLATELET
Abs Immature Granulocytes: 0.01 10*3/uL (ref 0.00–0.07)
Basophils Absolute: 0 10*3/uL (ref 0.0–0.1)
Basophils Relative: 0 %
Eosinophils Absolute: 0.2 10*3/uL (ref 0.0–0.5)
Eosinophils Relative: 3 %
HCT: 38.7 % — ABNORMAL LOW (ref 39.0–52.0)
Hemoglobin: 13.1 g/dL (ref 13.0–17.0)
Immature Granulocytes: 0 %
Lymphocytes Relative: 30 %
Lymphs Abs: 1.9 10*3/uL (ref 0.7–4.0)
MCH: 29.6 pg (ref 26.0–34.0)
MCHC: 33.9 g/dL (ref 30.0–36.0)
MCV: 87.4 fL (ref 80.0–100.0)
Monocytes Absolute: 0.8 10*3/uL (ref 0.1–1.0)
Monocytes Relative: 12 %
Neutro Abs: 3.5 10*3/uL (ref 1.7–7.7)
Neutrophils Relative %: 55 %
Platelets: 311 10*3/uL (ref 150–400)
RBC: 4.43 MIL/uL (ref 4.22–5.81)
RDW: 12.4 % (ref 11.5–15.5)
WBC: 6.4 10*3/uL (ref 4.0–10.5)
nRBC: 0 % (ref 0.0–0.2)

## 2023-01-31 LAB — COMPREHENSIVE METABOLIC PANEL
ALT: 15 U/L (ref 0–44)
AST: 19 U/L (ref 15–41)
Albumin: 3.7 g/dL (ref 3.5–5.0)
Alkaline Phosphatase: 75 U/L (ref 38–126)
Anion gap: 8 (ref 5–15)
BUN: 13 mg/dL (ref 6–20)
CO2: 28 mmol/L (ref 22–32)
Calcium: 8.8 mg/dL — ABNORMAL LOW (ref 8.9–10.3)
Chloride: 100 mmol/L (ref 98–111)
Creatinine, Ser: 0.8 mg/dL (ref 0.61–1.24)
GFR, Estimated: >60 mL/min (ref >60)
Glucose, Bld: 83 mg/dL (ref 70–99)
Potassium: 3.9 mmol/L (ref 3.5–5.1)
Sodium: 136 mmol/L (ref 135–145)
Total Bilirubin: 0.5 mg/dL (ref 0.3–1.2)
Total Protein: 6.6 g/dL (ref 6.5–8.1)

## 2023-01-31 LAB — RAPID URINE DRUG SCREEN, HOSP PERFORMED
Amphetamines: NOT DETECTED
Barbiturates: NOT DETECTED
Benzodiazepines: POSITIVE — AB
Cocaine: POSITIVE — AB
Opiates: NOT DETECTED
Tetrahydrocannabinol: NOT DETECTED

## 2023-01-31 LAB — TROPONIN I (HIGH SENSITIVITY)
Troponin I (High Sensitivity): 5 ng/L (ref <18)
Troponin I (High Sensitivity): 8 ng/L (ref <18)
Troponin I (High Sensitivity): <2 ng/L (ref <18)

## 2023-01-31 LAB — ETHANOL: Alcohol, Ethyl (B): <10 mg/dL (ref <10)

## 2023-01-31 MED ORDER — KETOROLAC TROMETHAMINE 30 MG/ML IJ SOLN
30.0000 mg | Freq: Once | INTRAMUSCULAR | Status: AC
  Administered 2023-01-31: 30 mg via INTRAVENOUS
  Filled 2023-01-31: qty 1

## 2023-01-31 NOTE — ED Provider Notes (Signed)
  Provider Note MRN:  [id number]  Arrival date & time: 01/31/23    ED Course and Medical Decision Making  Assumed care from Dr. Deretha Emory at shift change.  Sent from [facility], was there receiving help with regard to substance use issues.  Had chest pain while he was there.  Atypical, not having pain now, resting comfortably.  Awaiting second troponin.  1 AM update: Second troponin is negative, nothing to suggest emergent cardiopulmonary process at this time, appropriate for discharge.  Procedures  Final Clinical Impressions(s) / ED Diagnoses     ICD-10-CM   1. Substance abuse (HCC)  F19.10     2. Precordial pain  R07.2       ED Discharge Orders     None         Discharge Instructions      You were evaluated in the Emergency Department and after careful evaluation, we did not find any emergent condition requiring admission or further testing in the hospital.  Your exam/testing today is overall reassuring.  No signs of heart damage or any other emergencies.  Please return to the Emergency Department if you experience any worsening of your condition.   Thank you for allowing [place] to be a part of your care.    Elmer Sow. Pilar Plate, MD [facility] Emergency Medicine [facility] [email or web address]    Sabas Sous, MD 01/31/23 [id number]

## 2023-01-31 NOTE — ED Triage Notes (Signed)
Went to [facility] for follow up visit, can out of bathroom, "pale, lethargic, stubbing," [facility] staff administered 4mg  narcan EMS called

## 2023-01-31 NOTE — Discharge Instructions (Addendum)
You were evaluated in the Emergency Department and after careful evaluation, we did not find any emergent condition requiring admission or further testing in the hospital.  Your exam/testing today is overall reassuring.  No signs of heart damage or any other emergencies.  Please return to the Emergency Department if you experience any worsening of your condition.   Thank you for allowing [place] to be a part of your care.

## 2023-01-31 NOTE — Discharge Instructions (Signed)
Follow-up with Dr. Wyline Mood or one of his associates for your chest discomfort.

## 2023-01-31 NOTE — ED Notes (Signed)
Pt's RR ranges between 8-10 with O2 sat maintaining around 98%. Pt is arousable to verbal stimuli, but easily falls back asleep. Dr. Estell Harpin notified.

## 2023-02-02 ENCOUNTER — Encounter (HOSPITAL_COMMUNITY): Payer: Self-pay | Admitting: Student in an Organized Health Care Education/Training Program

## 2023-02-02 ENCOUNTER — Other Ambulatory Visit (INDEPENDENT_AMBULATORY_CARE_PROVIDER_SITE_OTHER)
Admission: EM | Admit: 2023-02-02 | Discharge: 2023-02-06 | Disposition: A | Discharge status: Other Acute Inpatient Hospital | Payer: Commercial Managed Care - PPO | Attending: Psychiatry | Admitting: Psychiatry

## 2023-02-02 ENCOUNTER — Ambulatory Visit (HOSPITAL_COMMUNITY)
Admission: EM | Admit: 2023-02-02 | Discharge: 2023-02-02 | Disposition: A | Discharge status: Other Acute Inpatient Hospital | Payer: Commercial Managed Care - PPO | Attending: Urology | Admitting: Urology

## 2023-02-02 DIAGNOSIS — F17200 Nicotine dependence, unspecified, uncomplicated: Secondary | ICD-10-CM

## 2023-02-02 DIAGNOSIS — F1010 Alcohol abuse, uncomplicated: Secondary | ICD-10-CM | POA: Insufficient documentation

## 2023-02-02 DIAGNOSIS — F1910 Other psychoactive substance abuse, uncomplicated: Secondary | ICD-10-CM | POA: Insufficient documentation

## 2023-02-02 DIAGNOSIS — F1193 Opioid use, unspecified with withdrawal: Secondary | ICD-10-CM | POA: Insufficient documentation

## 2023-02-02 DIAGNOSIS — T40411A Poisoning by fentanyl or fentanyl analogs, accidental (unintentional), initial encounter: Secondary | ICD-10-CM | POA: Diagnosis not present

## 2023-02-02 DIAGNOSIS — F1490 Cocaine use, unspecified, uncomplicated: Secondary | ICD-10-CM | POA: Insufficient documentation

## 2023-02-02 DIAGNOSIS — F1120 Opioid dependence, uncomplicated: Secondary | ICD-10-CM | POA: Diagnosis present

## 2023-02-02 DIAGNOSIS — Z79899 Other long term (current) drug therapy: Secondary | ICD-10-CM | POA: Diagnosis not present

## 2023-02-02 LAB — LIPID PANEL
Cholesterol: 177 mg/dL (ref 0–200)
HDL: 58 mg/dL (ref >40)
LDL Cholesterol: 101 mg/dL — ABNORMAL HIGH (ref 0–99)
Total CHOL/HDL Ratio: 3.1 {ratio}
Triglycerides: 88 mg/dL (ref <150)
VLDL: 18 mg/dL (ref 0–40)

## 2023-02-02 LAB — POCT URINE DRUG SCREEN - MANUAL ENTRY (I-SCREEN)
POC Amphetamine UR: NOT DETECTED
POC Buprenorphine (BUP): POSITIVE — AB
POC Cocaine UR: POSITIVE — AB
POC Marijuana UR: NOT DETECTED
POC Methadone UR: NOT DETECTED
POC Methamphetamine UR: NOT DETECTED
POC Morphine: NOT DETECTED
POC Oxazepam (BZO): POSITIVE — AB
POC Oxycodone UR: NOT DETECTED
POC Secobarbital (BAR): NOT DETECTED

## 2023-02-02 LAB — TSH: TSH: 0.244 u[IU]/mL — ABNORMAL LOW (ref 0.350–4.500)

## 2023-02-02 LAB — HIV ANTIBODY (ROUTINE TESTING W REFLEX): HIV Screen 4th Generation wRfx: NONREACTIVE

## 2023-02-02 MED ORDER — NICOTINE POLACRILEX 2 MG MT GUM
4.0000 mg | CHEWING_GUM | OROMUCOSAL | Status: DC | PRN
  Administered 2023-02-02 – 2023-02-06 (×13): 4 mg via ORAL
  Filled 2023-02-02 (×12): qty 2

## 2023-02-02 MED ORDER — NICOTINE 21 MG/24HR TD PT24
21.0000 mg | MEDICATED_PATCH | Freq: Every day | TRANSDERMAL | Status: DC
  Administered 2023-02-02 – 2023-02-06 (×5): 21 mg via TRANSDERMAL
  Filled 2023-02-02 (×5): qty 1

## 2023-02-02 MED ORDER — ACETAMINOPHEN 325 MG PO TABS
650.0000 mg | ORAL_TABLET | Freq: Four times a day (QID) | ORAL | Status: DC | PRN
  Administered 2023-02-03 – 2023-02-04 (×2): 650 mg via ORAL
  Filled 2023-02-02 (×2): qty 2

## 2023-02-02 MED ORDER — DICYCLOMINE HCL 20 MG PO TABS
20.0000 mg | ORAL_TABLET | Freq: Four times a day (QID) | ORAL | Status: DC | PRN
  Administered 2023-02-03 (×3): 20 mg via ORAL
  Filled 2023-02-02 (×4): qty 1

## 2023-02-02 MED ORDER — ALUM & MAG HYDROXIDE-SIMETH 200-200-20 MG/5ML PO SUSP
30.0000 mL | ORAL | Status: DC | PRN
  Administered 2023-02-02: 30 mL via ORAL
  Filled 2023-02-02: qty 30

## 2023-02-02 MED ORDER — QUETIAPINE FUMARATE ER 50 MG PO TB24
100.0000 mg | ORAL_TABLET | Freq: Every day | ORAL | Status: DC
  Administered 2023-02-02 – 2023-02-03 (×2): 100 mg via ORAL
  Filled 2023-02-02 (×2): qty 2

## 2023-02-02 MED ORDER — CLONIDINE HCL 0.1 MG PO TABS
0.1000 mg | ORAL_TABLET | Freq: Four times a day (QID) | ORAL | Status: DC
  Administered 2023-02-02 – 2023-02-04 (×6): 0.1 mg via ORAL
  Filled 2023-02-02 (×7): qty 1

## 2023-02-02 MED ORDER — ONDANSETRON 4 MG PO TBDP
4.0000 mg | ORAL_TABLET | Freq: Four times a day (QID) | ORAL | Status: DC | PRN
  Administered 2023-02-02 – 2023-02-05 (×4): 4 mg via ORAL
  Filled 2023-02-02 (×4): qty 1

## 2023-02-02 MED ORDER — CLONIDINE HCL 0.1 MG PO TABS: 0.1000 mg | ORAL_TABLET | ORAL | Status: DC

## 2023-02-02 MED ORDER — HYDROXYZINE HCL 25 MG PO TABS
25.0000 mg | ORAL_TABLET | Freq: Four times a day (QID) | ORAL | Status: DC | PRN
  Administered 2023-02-03 – 2023-02-05 (×6): 25 mg via ORAL
  Filled 2023-02-02 (×6): qty 1

## 2023-02-02 MED ORDER — NAPROXEN 500 MG PO TABS
500.0000 mg | ORAL_TABLET | Freq: Two times a day (BID) | ORAL | Status: DC | PRN
  Administered 2023-02-02 – 2023-02-05 (×4): 500 mg via ORAL
  Filled 2023-02-02 (×4): qty 1

## 2023-02-02 MED ORDER — TRAZODONE HCL 50 MG PO TABS
50.0000 mg | ORAL_TABLET | Freq: Every evening | ORAL | Status: DC | PRN
  Administered 2023-02-03: 50 mg via ORAL
  Filled 2023-02-02: qty 1

## 2023-02-02 MED ORDER — CLONIDINE HCL 0.1 MG PO TABS: 0.1000 mg | ORAL_TABLET | Freq: Every day | ORAL | Status: DC

## 2023-02-02 MED ORDER — SERTRALINE HCL 50 MG PO TABS
50.0000 mg | ORAL_TABLET | Freq: Every day | ORAL | Status: DC
  Administered 2023-02-02 – 2023-02-06 (×5): 50 mg via ORAL
  Filled 2023-02-02 (×5): qty 1

## 2023-02-02 MED ORDER — METHOCARBAMOL 500 MG PO TABS
500.0000 mg | ORAL_TABLET | Freq: Three times a day (TID) | ORAL | Status: DC | PRN
  Administered 2023-02-02 – 2023-02-05 (×7): 500 mg via ORAL
  Filled 2023-02-02 (×7): qty 1

## 2023-02-02 MED ORDER — LOPERAMIDE HCL 2 MG PO CAPS: 2.0000 mg | ORAL_CAPSULE | ORAL | Status: DC | PRN

## 2023-02-02 MED ORDER — MAGNESIUM HYDROXIDE 400 MG/5ML PO SUSP
30.0000 mL | Freq: Every day | ORAL | Status: DC | PRN
  Administered 2023-02-05: 30 mL via ORAL
  Filled 2023-02-02: qty 30

## 2023-02-02 NOTE — Progress Notes (Signed)
   02/02/23 1131  [facility] Triage Screening (Walk-ins at [facility] only)  What Is the Reason for Your Visit/Call Today? Pt presents to [facility] voluntarily accompanied by a friend. Pt states that he had 2 drug overdoses this past week, Pt states that he was just recently discharged from [facility] 2 days ago. Pt states that he would like to detox off of fentanyl and cocaine. Pt denies SI, HI, AVH and alcohol usage at this present time. Pt endorses drug usage on yesterday, stating that he used about 5 grams of fentanyl and a couple grams of cocaine.  How Long Has This Been Causing You Problems? 1-6 months  Have You Recently Had Any Thoughts About Hurting Yourself? No  Are You Planning to Commit Suicide/Harm Yourself At This time? No  Have you Recently Had Thoughts About Hurting Someone Karolee Ohs? No  Are You Planning To Harm Someone At This Time? No  Are you currently experiencing any auditory, visual or other hallucinations? No  Have You Used Any Alcohol or Drugs in the Past 24 Hours? Yes  How long ago did you use Drugs or Alcohol? yesterday  What Did You Use and How Much? fentanyl (about 5 grams) cocaine (a couple grams)  Do you have any current medical co-morbidities that require immediate attention? No  Clinician description of patient physical appearance/behavior: casually dressed, calm, cooperative  What Do You Feel Would Help You the Most Today? Alcohol or Drug Use Treatment;Social Support  If access to [facility] was not available, would you have sought care in the Emergency Department? Yes  Determination of Need Routine (7 days)  Options For Referral Chemical Dependency Intensive Outpatient Therapy (CDIOP);Facility-Based Crisis;Medication Management;Outpatient Therapy

## 2023-02-02 NOTE — Tx Team (Signed)
LCSW met with patient at bedside to assess current mood, affect, physical state, and inquire about needs/goals while here in [facility] and after discharge. Patient reports he presented due to needing help with getting off substances. Patient reports having two recent overdoses on fentanyl, with one being while at a [facility] in [place]. Patient reports he is now banned from all [facility] facilities due to his use of substances while in their program. Patient reports he had someone who brought the drugs into the facility for him and he used while there. Patient reports he has been struggling with his use for the past 2 months and feels "too far gone". Patient reports he has loss his relationship with his wife and has not been able to see his children due to his use. Patient reports he is from [place], [place] and reports having three children who are currently with his wife. Patient denies having any support outside of an uncle that tries to help him every now and then. Patient reports he has to get away from [place] or he knows he will just continue to use. Patient reports being apart of [employer] within [employer], however reports he uses the insurance his wife has through her job. Patient denies having any upcoming court dates or pending legal charges. 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 Patient reports his goal is to receive residential placement once he is stable for discharge. Patient reports an interest in any facility away from [facility]. LCSW explored the patient's motivating factors for change and patient stated his three children. Patient aware that LCSW will send referrals out for review and will follow up to provide updates as received. Patient expressed understanding and appreciation of LCSW assistance. No other needs were reported at this time by patient.   Referrals have been sent to the following agencies for  review:  [facility] in [facility]  LCSW will continue to follow and provide support to patient while on [facility] unit.   Fernande Boyden, LCSW Clinical Social Worker [place] [facility] Ph: [phone]

## 2023-02-02 NOTE — ED Notes (Signed)
Patient transferred from [facility] to [facility] requesting assistance in detox from polysubstance abuse. Calm, cooperative throughout interview process, though unsteady and leaning over throughout, almost dropping paperwork several times during process. Pleasant demeanor. Skin assessment completed. Oriented to unit. Snack and drink offered. Patient verbally contract for safety. Safety checks in place according to facility policy. No complaints from patient at this time.

## 2023-02-02 NOTE — Progress Notes (Signed)
At approximately 1905 writer was informed by [facility] that pt was complaining of chest pain. Pt reported mid sternal chest pain non radiating on assessment. No SOB observed.  VS was checked and it was WDL. [place], NP was notified and she came to assess pt. Pt reported that it could be heartburn due to the meal he had at dinner. Pt then requested Maalox and it was administered per order. Pt was advised to notify night nurse if his symptoms does not improve.

## 2023-02-02 NOTE — BH Assessment (Signed)
Comprehensive Clinical Assessment (CCA) Note  02/02/2023 Eric Lowery [id number]  Disposition: Per Cecilio Asper, NP, patient is recommended for continuous assessment.    The patient demonstrates the following risk factors for suicide: Chronic risk factors for suicide include: substance use disorder. Acute risk factors for suicide include: family or marital conflict. Protective factors for this patient include: responsibility to others (children, family). Considering these factors, the overall suicide risk at this point appears to be low. Patient is not appropriate for outpatient follow up.  Eric Lowery is a 37 year old male presenting to [facility] voluntarily seeking detox and rehab treatment. Patient reports unintentional overdose on fentanyl twice within the last couple of days. Patient reports IV use of about $500 of fentanyl daily and snorting several grams of cocaine daily for the past couple of months. Patient reported that he attempted to go to [facility] treatment recently but he "had the plug waiting for me at [facility]" and ended up overdosing. Patient reports stressors related to being separated from his wife for about 3 months. Patient states that he started entertaining the wrong company which lead to his drug use and separation from his family.   Patient denies history of mental health services. Patient currently lives with his uncle. Patient denies legal issues and he reports access to firearms which are in a secure and place. 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  Patient is oriented x4, engaged, alert and cooperative. Patient eye contact and speech is normal, his affect is appropriate. Patient denies SI, HI, AVH. Patient is denies current withdrawal symptoms however is asking for nicotine gum or patches, stating that he smokes up to two packs of cigarettes daily.    Chief Complaint:  Chief Complaint  Patient presents with   Addiction Problem   Visit Diagnosis: Polysubstance  abuse    CCA Screening, Triage and Referral (STR)  Patient Reported Information How did you hear about [place]? Hospital discharge What Is the Reason for Your Visit/Call Today? Pt presents to [facility] voluntarily accompanied by a friend. Pt states that he had 2 drug overdoses this past week, Pt states that he was just recently discharged from [facility] 2 days ago. Pt states that he would like to detox off of fentanyl and cocaine. Pt denies SI, HI, AVH and alcohol usage at this present time. Pt endorses drug usage on yesterday, stating that he used about 5 grams of fentanyl and a couple grams of cocaine.  How Long Has This Been Causing You Problems? 1-6 months  What Do You Feel Would Help You the Most Today? Alcohol or Drug Use Treatment; Social Support   Have You Recently Had Any Thoughts About Hurting Yourself? No  Are You Planning to Commit Suicide/Harm Yourself At This time? No   Flowsheet Row ED from 02/02/2023 in [facility] ED from 01/31/2023 in [facility] Emergency Department at [facility] ED from 01/30/2023 in [facility] at [facility]  C-SSRS RISK CATEGORY No Risk No Risk No Risk       Have you Recently Had Thoughts About Hurting Someone Karolee Ohs? No  Are You Planning to Harm Someone at This Time? No  Explanation: NA   Have You Used Any Alcohol or Drugs in the Past 24 Hours? Yes  What Did You Use and How Much? fentanyl (about 5 grams) cocaine (a couple grams)   Do You Currently Have a Therapist/Psychiatrist? No  Name of Therapist/Psychiatrist: Name of Therapist/Psychiatrist: NA  Have You Been Recently Discharged From Any Office Practice or Programs? Yes  Explanation of Discharge From Practice/Program: [facility] 2 DAYS AGO     CCA Screening Triage Referral Assessment Type of Contact: Face-to-Face  Telemedicine Service Delivery:   Is this Initial or Reassessment?   Date Telepsych consult ordered in [facility]:     Time Telepsych consult ordered in [facility]:    Location of Assessment: [facility] Assessment Services  Provider Location: [facility] Assessment Services   Collateral Involvement: NA   Does Patient Have a Automotive engineer Guardian? No  Legal Guardian Contact Information: NA  Copy of Legal Guardianship Form: -- (NA)  Legal Guardian Notified of Arrival: -- (NA)  Legal Guardian Notified of Pending Discharge: -- (NA)  If Minor and Not Living with Parent(s), Who has Custody? NA  Is CPS involved or ever been involved? Never  Is APS involved or ever been involved? Never   Patient Determined To Be At Risk for Harm To Self or Others Based on Review of Patient Reported Information or Presenting Complaint? No  Method: No Plan  Availability of Means: No access or NA  Intent: Vague intent or NA  Notification Required: No need or identified person  Additional Information for Danger to Others Potential: NA Additional Comments for Danger to Others Potential: NA  Are There Guns or Other Weapons in Your Home? Yes  Types of Guns/Weapons: GUNS  Are These Weapons Safely Secured?                            Yes  Who Could Verify You Are Able To Have These Secured: WIFE  Do You Have any Outstanding Charges, Pending Court Dates, Parole/Probation? DENIES  Contacted To Inform of Risk of Harm To Self or Others: Unable to Contact:    Does Patient Present under Involuntary Commitment? No    [place] of Residence: [place]   Patient Currently Receiving the Following Services: Not Receiving Services   Determination of Need: Urgent (48 hours)   Options For Referral: Chemical Dependency Intensive Outpatient Therapy (CDIOP); Facility-Based Crisis; Medication Management; Outpatient Therapy     CCA Biopsychosocial Patient Reported Schizophrenia/Schizoaffective Diagnosis in Past: No   Strengths: NA   Mental Health Symptoms Depression:   None   Duration of Depressive symptoms:     Mania:   None   Anxiety:    Worrying   Psychosis:   None   Duration of Psychotic symptoms:    Trauma:   None   Obsessions:   None   Compulsions:   None   Inattention:   None   Hyperactivity/Impulsivity:   None   Oppositional/Defiant Behaviors:   None   Emotional Irregularity:   None   Other Mood/Personality Symptoms:   NA    Mental Status Exam Appearance and self-care  Stature:   Tall   Weight:   Average weight   Clothing:   Age-appropriate   Grooming:   Normal   Cosmetic use:   None   Posture/gait:   Normal   Motor activity:   Not Remarkable   Sensorium  Attention:   Normal   Concentration:   Normal   Orientation:   X5   Recall/memory:   Normal   Affect and Mood  Affect:   Appropriate   Mood:   Euthymic   Relating  Eye contact:   Normal   Facial expression:   Responsive   Attitude toward examiner:   Cooperative  Thought and Language  Speech flow:  Clear and Coherent   Thought content:  NA  Preoccupation:   None   Hallucinations:   None   Organization:   Coherent   [employer] of Knowledge:   Fair   Intelligence:   Average   Abstraction:   Normal   Judgement:   Poor   Reality Testing:   Adequate   Insight:   Fair   Decision Making:   Impulsive   Social Functioning  Social Maturity:   Responsible   Social Judgement:   Normal   Stress  Stressors:   Family conflict   Coping Ability:   Human resources officer Deficits:   None   Supports:   Support needed     Religion: Religion/Spirituality Are You A Religious Person?: No How Might This Affect Treatment?: NA  Leisure/Recreation: Leisure / Recreation Do You Have Hobbies?: Yes Leisure and Hobbies: BIG GAME HUNTING  Exercise/Diet: Exercise/Diet Do You Exercise?: No Have You Gained or Lost A Significant Amount of Weight in the Past Six Months?: No Do You Follow a Special Diet?: No Do You Have Any Trouble  Sleeping?: No   CCA Employment/Education Employment/Work Situation: Employment / Work Situation Employment Situation: Employed Work Stressors: NA Patient's Job has Been Impacted by Current Illness: No Has Patient ever Been in Equities trader?: No  Education: Education Is Patient Currently Attending School?: No Last Grade Completed: 12 Did You Product manager?: Yes What Type of College Degree Do you Have?: [employer] Did You Have An Individualized Education Program (IIEP): No Did You Have Any Difficulty At School?: No Patient's Education Has Been Impacted by Current Illness: No   CCA Family/Childhood History Family and Relationship History: Family history Marital status: Married Number of Years Married:  Konrad Felix) What types of issues is patient dealing with in the relationship?: SEPERATED FROM WIFE FOR PAST 3 MONTHS. Additional relationship information: NA Does patient have children?: Yes How many children?: 3 How is patient's relationship with their children?: GOOD  Childhood History:  Childhood History By whom was/is the patient raised?: Both parents Did patient suffer any verbal/emotional/physical/sexual abuse as a child?: No Did patient suffer from severe childhood neglect?: No Has patient ever been sexually abused/assaulted/raped as an adolescent or adult?: No Was the patient ever a victim of a crime or a disaster?: No Witnessed domestic violence?: No Has patient been affected by domestic violence as an adult?: No       CCA Substance Use Alcohol/Drug Use: Alcohol / Drug Use Pain Medications: SEE MAR Prescriptions: SEE MAR Over the Counter: SEE MAR History of alcohol / drug use?: Yes Longest period of sobriety (when/how long): UNKNOWN Negative Consequences of Use: Financial, Personal relationships Withdrawal Symptoms: None Substance #1 Name of Substance 1: FENTANYL 1 - Age of First Use: 37 1 - Amount (size/oz): $500 1 - Frequency:  DAILY 1 - Duration: 2 MONTHS 1 - Last Use / Amount: MORE THAN 24 HOURS AGO 1 - Method of Aquiring: "PLUG" 1- Route of Use: IV Substance #2 Name of Substance 2: COCAINE 2 - Age of First Use: 37 2 - Amount (size/oz): SEVERAL GRAMS 2 - Frequency: DAILY 2 - Duration: ON GOING 2 - Last Use / Amount: LESS THAN 24 HOURS AGO 2 - Method of Aquiring: "PLUG" 2 - Route of Substance Use: SNORTING                     ASAM's:  Six Dimensions  of Multidimensional Assessment  Dimension 1:  Acute Intoxication and/or Withdrawal Potential:      Dimension 2:  Biomedical Conditions and Complications:      Dimension 3:  Emotional, Behavioral, or Cognitive Conditions and Complications:     Dimension 4:  Readiness to Change:     Dimension 5:  Relapse, Continued use, or Continued Problem Potential:     Dimension 6:  Recovery/Living Environment:     ASAM Severity Score:    ASAM Recommended Level of Treatment:     Substance use Disorder (SUD) Substance Use Disorder (SUD)  Checklist Symptoms of Substance Use: Continued use despite having a persistent/recurrent physical/psychological problem caused/exacerbated by use, Continued use despite persistent or recurrent social, interpersonal problems, caused or exacerbated by use, Substance(s) often taken in larger amounts or over longer times than was intended  Recommendations for Services/Supports/Treatments: Recommendations for Services/Supports/Treatments Recommendations For Services/Supports/Treatments: Facility Based Crisis, Detox  Discharge Disposition: Discharge Disposition Medical Exam completed: Yes Disposition of Patient: Admit  DSM5 Diagnoses: There are no problems to display for this patient.    Referrals to Alternative Service(s): Referred to Alternative Service(s):   Place:   Date:   Time:    Referred to Alternative Service(s):   Place:   Date:   Time:    Referred to Alternative Service(s):   Place:   Date:   Time:    Referred to  Alternative Service(s):   Place:   Date:   Time:     Audree Camel, [facility]

## 2023-02-02 NOTE — ED Notes (Addendum)
Provider Joaquin Courts, NP notified of elevated BP of 127/94, no new orders at this time. Patient stable and asymptomatic.

## 2023-02-02 NOTE — ED Provider Notes (Signed)
Behavioral Health Urgent Care Medical Screening Exam  Patient Name: Eric Lowery MRN: [id number] Date of Evaluation: 02/02/23 Chief Complaint:   Diagnosis:  Final diagnoses:  Substance abuse (HCC)    History of Present illness: Eric Lowery is a 37 y.o. male with history of substance abuse. Patient prsented volunatarily to [facility] requesting detox and substance abuse treatment.   This provider evaluated patient  face to face with Dr. Lucianne Muss and reviewed his chart. On assessment, patient reports that he accidentally overdosed on fentanyl twice within a 24hr period while at [facility]; per chart he was treated in the ED on 10/14 and 01/31/23 . Patient reports feelings of distress and anxiety following the overdose. He expressed concerns about his substance use and its impact on his marriage. He says he is currently separated from his wife due to substance abuse. He says he began using IV fentanyl and snorting cocaine approximately 4 months ago; he is unable to identify any trigger for substance use and denies prior history of substance abuse. He says he was recently enrolled in a substance abuse treatment program with [facility] but left after 15 hours because "it was a faith based program and I don't agree with their philosophy." He also report participating in rehab with [facility] in [place] as well as in [place]. He says he was using fentanyl while in rehab in [place] and accidentally overdosed. Patient says he is now motivated to get sober and acknowledge negative impact of his substance on his relationships, particularly with his children. He says denies suicidal/homicidal ideation, denies prior suicidal attempt. He denies AVH and paranoia. He says he is currently residing with his uncle and works on his family. Patient is alert and oriented x4, cooperative. Patient's speech is clear and coherent. He reported his mood as depressed, affect is congruent. Patient's thought process is coherent and  relevant. No objective signs of psychosis, mania, or delusional thought content. UDS on admission is positive for buprenorphine, benzodiazepines, and cocaine.    Flowsheet Row ED from 02/02/2023 in [facility] Most recent reading at 02/02/2023  2:13 PM ED from 02/02/2023 in [facility] Most recent reading at 02/02/2023 11:41 AM ED from 01/31/2023 in [facility] Emergency Department at [facility] Most recent reading at 01/31/2023  9:44 AM  C-SSRS RISK CATEGORY No Risk No Risk No Risk       Psychiatric Specialty Exam  Presentation  General Appearance:Appropriate for Environment  Eye Contact:Good  Speech:Clear and Coherent  Speech Volume:Normal  Handedness:Right   Mood and Affect  Mood:Depressed  Affect:Congruent   Thought Process  Thought Processes:Coherent  Descriptions of Associations:Intact  Orientation:None  Thought Content:WDL  Diagnosis of Schizophrenia or Schizoaffective disorder in past: No   Hallucinations:None  Ideas of Reference:None  Suicidal Thoughts:No  Homicidal Thoughts:No   Sensorium  Memory:Immediate Good; Remote Good; Recent Good  Judgment:Fair  Insight:Fair   Executive Functions  Concentration:Good  Attention Span:Good  Recall:Good  Fund of Knowledge:Good  Language:Good   Psychomotor Activity  Psychomotor Activity:Normal   Assets  Assets:Housing; Physical Health; Social Support   Sleep  Sleep:Good  Number of hours: No data recorded  Physical Exam: Physical Exam Vitals reviewed.  Constitutional:      General: He is not in acute distress.    Appearance: He is well-developed.  HENT:     Head: Normocephalic and atraumatic.  Eyes:     Conjunctiva/sclera: Conjunctivae normal.  Cardiovascular:     Rate and  Rhythm: Normal rate and regular rhythm.  Pulmonary:     Effort: Pulmonary effort is normal.  Musculoskeletal:        General: Normal range  of motion.     Cervical back: Neck supple.  Neurological:     Mental Status: He is alert and oriented to person, place, and time.  Psychiatric:        Attention and Perception: Attention and perception normal.        Mood and Affect: Mood is depressed.        Speech: Speech normal.        Behavior: Behavior normal. Behavior is cooperative.        Thought Content: Thought content normal.        Cognition and Memory: Cognition normal.    Review of Systems  Constitutional: Negative.   HENT: Negative.    Eyes: Negative.   Respiratory: Negative.    Cardiovascular: Negative.   Gastrointestinal: Negative.   Genitourinary: Negative.   Musculoskeletal: Negative.   Skin: Negative.   Neurological: Negative.   Endo/Heme/Allergies: Negative.   Psychiatric/Behavioral:  Positive for depression and substance abuse. The patient is nervous/anxious.    Blood pressure (!) 145/94, pulse 87, temperature 98.2 F (36.8 C), temperature source Oral, resp. rate 17, SpO2 99%. There is no height or weight on file to calculate BMI.  Musculoskeletal: Strength & Muscle Tone: within normal limits Gait & Station: normal Patient leans: Right   [facility] MSE Discharge Disposition for Follow up and Recommendations: Patient will be admitted to [facility] for substance abuse treatment.  Available lab results reviewed  -HIV, Hep B, Hep C, EKG, lipid panel, UDS ordered.     Maricela Bo, NP 02/02/2023, 5:17 PM

## 2023-02-02 NOTE — Progress Notes (Signed)
Pt is presently asleep. Respirations are even and unlabored. Pt is exhibiting mild withdrawal symptoms. No signs of acute distress noted.  Pt was observed  with unsteady gaits when he approached the nursing station for his evening meds. Pt reported feeling dizzy. Writer accompanied him to his room and advised him to lay on the bed away from the edge to prevent him from falling off the bed. Pt was receptive. Staff will monitor for pt's safety.

## 2023-02-02 NOTE — Group Note (Signed)
Group Topic: Social Support  Group Date: 02/02/2023 Start Time: 2000 End Time: 2030 Facilitators: Debe Coder, NT  Department: [facility]  Number of Participants: 5  Group Focus: community group Treatment Modality:  Individual Therapy Interventions utilized were clarification Purpose: express feelings  Name: Eric Lowery Date of Birth: December 27, 1985  MR: [id number]    Level of Participation: pt did not attend group Quality of Participation:  Interactions with others:  Mood/Affect:  Triggers (if applicable):  Cognition:  Progress:  Response:  Plan:   Patients Problems:  Patient Active Problem List   Diagnosis Date Noted   Opioid use disorder, severe, dependence (HCC) 02/02/2023   On high dose antipsychotic drug therapy 02/02/2023

## 2023-02-02 NOTE — ED Notes (Signed)
Patient discharged to [facility]

## 2023-02-02 NOTE — ED Provider Notes (Addendum)
Facility Based Crisis Admission H&P  Date: 02/02/23 Patient Name: Eric Lowery MRN: [id number] Chief Complaint: opioid detox  Diagnoses:  Final diagnoses:  Tobacco use disorder  On high dose antipsychotic drug therapy  Opioid use with withdrawal (HCC)  Alcohol abuse   HPI:  Eric Lowery is a 37 yo male with a PPHx of opioid use disorder, stimulant use disorder, and tobacco use disorder, admitted to [facility]  on 10/17 for detox from recent fentanyl and cocaine use with interest in Residential Rehabilitation. He reports a hx of IV drug use. UDS on admission is positive for buprenorphine, benzodiazepines, and cocaine.  Per CCA note and confirmed by patient on assessment: "Eric Lowery is a 37 year old male presenting to [facility] voluntarily seeking detox and rehab treatment. Patient reports unintentional overdose on fentanyl twice within the last couple of days. Patient reports IV use of about $500 of fentanyl daily and snorting several grams of cocaine daily for the past couple of months. Patient reported that he attempted to go to [facility] treatment recently but he "had the plug waiting for me at [facility]" and ended up overdosing. Patient reports stressors related to being separated from his wife for about 3 months. Patient states that he started entertaining the wrong company which lead to his drug use and separation from his family.    Patient denies history of mental health services. Patient currently lives with his uncle. Patient denies legal issues and he reports access to firearms which are in a secure and place. 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 He is motivated to stop his substance use, acknowledges it has affected his marriage and his work life.  He is a history auditory, which occur in the evenings and have been a source of shame.  He alludes to a history of trauma, but does  not wish to specify.  He believes his currently prescribed Seroquel has helped to manage symptoms of psychosis.  Patient believes the symptoms occur since use.  Patient was asked about his past psychiatric history, he is unable to specify any formal previous psychiatric diagnoses.  Substance Use Hx: Fentanyl: IV use 3.5 grams 2 days ago. Uses it daily for the past 2.5 months.  Cocaine: used today, intranasal. 2.5-3.0 grams daily for the past 2 months (since wife separated) Tobacco: 2 ppd for the past 20 years Alcohol - Drinks moonshine nightly when he comes back from work, around 0.5 gallons a night.  Patient has participated in rehab with [facility] in [place]  and in [place]. Pt recently enrolled in substance abuse treatment program with [facility] but left after 15 hours because "it was a faith based program and I don't agree with their philosophy."   Past Psychiatric Hx: Current Psychiatrist: Dr. April Manson, in [place] Previous Psychiatric Diagnoses: pt is unable to specify any formal previous psychiatric diagnoses Psychiatric medication history/compliance: reports compliance   Past Medical History: Allergies: penicillins, shellfish, peanuts Seizures: reports previous hx of epilepsy, has not had a seizure in "years", reports previously being prescribed Keppra.    Social History: Marital Status: Married Children: 3 children Legal: Upcoming court date in December to get his license 2/2 texting and driving. No past hx of incarcerations or legal charges.  Military: No     PHQ 2-9:  Flowsheet Row ED from 02/02/2023 in [facility]  Thoughts that you would be better  off dead, or of hurting yourself in some way More than half the days  PHQ-9 Total Score 26       Flowsheet Row ED from 02/02/2023 in [facility] Most recent reading at 02/02/2023  2:13 PM ED from 02/02/2023 in [facility] Most recent reading at 02/02/2023 11:41 AM ED from 01/31/2023 in [facility] Emergency Department at [facility] Most recent reading at 01/31/2023  9:44 AM  C-SSRS RISK CATEGORY No Risk No Risk No Risk       Screenings    Flowsheet Row Most Recent Value  COWS Total Score 11       Total Time spent with patient: 15 minutes  Musculoskeletal  Strength & Muscle Tone: within normal limits Gait & Station: normal Patient leans: N/A  Psychiatric Specialty Exam  Presentation General Appearance:  Appropriate for Environment  Eye Contact: Good  Speech: Clear and Coherent  Speech Volume: Normal  Handedness: Right   Mood and Affect  Mood: Depressed  Affect: Congruent   Thought Process  Thought Processes: Coherent  Descriptions of Associations:Intact  Orientation:None  Thought Content:WDL  Diagnosis of Schizophrenia or Schizoaffective disorder in past: No   Hallucinations:Hallucinations: None  Ideas of Reference:None  Suicidal Thoughts:Suicidal Thoughts: No  Homicidal Thoughts:Homicidal Thoughts: No   Sensorium  Memory: Immediate Good; Remote Good; Recent Good  Judgment: Fair  Insight: Fair   Art therapist  Concentration: Good  Attention Span: Good  Recall: Good  Fund of Knowledge: Good  Language: Good   Psychomotor Activity  Psychomotor Activity: Psychomotor Activity: Normal   Assets  Assets: Housing; Physical Health; Social Support   Sleep  Sleep: Sleep: Good   Nutritional Assessment (For OBS and FBC admissions only) Has the patient had a weight loss or gain of 10 pounds or more in the last 3 months?: No Has the patient had a decrease in food intake/or appetite?: No Does the patient have dental problems?: No Does the patient have eating habits or behaviors that may be indicators of an eating disorder including binging or inducing vomiting?: No Has the patient recently lost weight without  trying?: 0 Has the patient been eating poorly because of a decreased appetite?: 0 Malnutrition Screening Tool Score: 0    Physical Exam Vitals and nursing note reviewed.  HENT:     Head: Normocephalic and atraumatic.  Pulmonary:     Effort: Pulmonary effort is normal. No respiratory distress.  Skin:    General: Skin is warm and dry.  Neurological:     Mental Status: He is alert.    Review of Systems  Constitutional:  Positive for chills and malaise/fatigue.  Respiratory:  Negative for shortness of breath.   Cardiovascular:  Negative for chest pain.  Gastrointestinal:  Positive for abdominal pain.  Neurological:  Positive for dizziness and headaches.  Psychiatric/Behavioral:  Positive for depression and substance abuse. Negative for hallucinations, memory loss and suicidal ideas. The patient is nervous/anxious. The patient does not have insomnia.     Blood pressure (!) 126/99, pulse 85, temperature 98.6 F (37 C), temperature source Oral, resp. rate 18, SpO2 97%. There is no height or weight on file to calculate BMI.  Last Labs:  Admission on 02/02/2023, Discharged on 02/02/2023  Component Date Value Ref Range Status   HIV Screen 4th Generation wRfx 02/02/2023 Non Reactive  Non Reactive Final   Performed at [facility], [address] [place], [place] [address]   POC Amphetamine  UR 02/02/2023 None Detected  NONE DETECTED (Cut Off Level 1000 ng/mL) Final   POC Secobarbital (BAR) 02/02/2023 None Detected  NONE DETECTED (Cut Off Level 300 ng/mL) Final   POC Buprenorphine (BUP) 02/02/2023 Positive (A)  NONE DETECTED (Cut Off Level 10 ng/mL) Final   POC Oxazepam (BZO) 02/02/2023 Positive (A)  NONE DETECTED (Cut Off Level 300 ng/mL) Final   POC Cocaine UR 02/02/2023 Positive (A)  NONE DETECTED (Cut Off Level 300 ng/mL) Final   POC Methamphetamine UR 02/02/2023 None Detected  NONE DETECTED (Cut Off Level 1000 ng/mL) Final   POC Morphine 02/02/2023 None Detected  NONE DETECTED  (Cut Off Level 300 ng/mL) Final   POC Methadone UR 02/02/2023 None Detected  NONE DETECTED (Cut Off Level 300 ng/mL) Final   POC Oxycodone UR 02/02/2023 None Detected  NONE DETECTED (Cut Off Level 100 ng/mL) Final   POC Marijuana UR 02/02/2023 None Detected  NONE DETECTED (Cut Off Level 50 ng/mL) Final   TSH 02/02/2023 0.244 (L)  0.350 - 4.500 uIU/mL Final   Comment: Performed by a 3rd Generation assay with a functional sensitivity of <=0.01 uIU/mL. Performed at [facility], [address], [place], [place] [address]    Cholesterol 02/02/2023 177  0 - 200 mg/dL Final   Triglycerides 62/13/0865 88  <150 mg/dL Final   HDL 78/46/9629 58  >40 mg/dL Final   Total CHOL/HDL Ratio 02/02/2023 3.1  RATIO Final   VLDL 02/02/2023 18  0 - 40 mg/dL Final   LDL Cholesterol 02/02/2023 101 (H)  0 - 99 mg/dL Final   Comment:        Total Cholesterol/HDL:CHD Risk Coronary Heart Disease Risk Table                     Men   Women  1/2 Average Risk   3.4   3.3  Average Risk       5.0   4.4  2 X Average Risk   9.6   7.1  3 X Average Risk  23.4   11.0        Use the calculated Patient Ratio above and the CHD Risk Table to determine the patient's CHD Risk.        ATP III CLASSIFICATION (LDL):  <100     mg/dL   Optimal  528-413  mg/dL   Near or Above                    Optimal  130-159  mg/dL   Borderline  244-010  mg/dL   High  >272     mg/dL   Very High Performed at [facility], [address], [place], [place] [address]   Admission on 01/31/2023, Discharged on 01/31/2023  Component Date Value Ref Range Status   WBC 01/31/2023 6.4  4.0 - 10.5 K/uL Final   RBC 01/31/2023 4.43  4.22 - 5.81 MIL/uL Final   Hemoglobin 01/31/2023 13.1  13.0 - 17.0 g/dL Final   HCT 40/34/7425 38.7 (L)  39.0 - 52.0 % Final   MCV 01/31/2023 87.4  80.0 - 100.0 fL Final   MCH 01/31/2023 29.6  26.0 - 34.0 pg Final   MCHC 01/31/2023 33.9  30.0 - 36.0 g/dL Final   RDW 95/63/8756 12.4  11.5 - 15.5 % Final    Platelets 01/31/2023 311  150 - 400 K/uL Final   nRBC 01/31/2023 0.0  0.0 - 0.2 % Final   Neutrophils Relative %  01/31/2023 55  % Final   Neutro Abs 01/31/2023 3.5  1.7 - 7.7 K/uL Final   Lymphocytes Relative 01/31/2023 30  % Final   Lymphs Abs 01/31/2023 1.9  0.7 - 4.0 K/uL Final   Monocytes Relative 01/31/2023 12  % Final   Monocytes Absolute 01/31/2023 0.8  0.1 - 1.0 K/uL Final   Eosinophils Relative 01/31/2023 3  % Final   Eosinophils Absolute 01/31/2023 0.2  0.0 - 0.5 K/uL Final   Basophils Relative 01/31/2023 0  % Final   Basophils Absolute 01/31/2023 0.0  0.0 - 0.1 K/uL Final   Immature Granulocytes 01/31/2023 0  % Final   Abs Immature Granulocytes 01/31/2023 0.01  0.00 - 0.07 K/uL Final   Performed at [facility], [address] [place], [place] [address]   Sodium 01/31/2023 136  135 - 145 mmol/L Final   Potassium 01/31/2023 3.9  3.5 - 5.1 mmol/L Final   Chloride 01/31/2023 100  98 - 111 mmol/L Final   CO2 01/31/2023 28  22 - 32 mmol/L Final   Glucose, Bld 01/31/2023 83  70 - 99 mg/dL Final   Glucose reference range applies only to samples taken after fasting for at least 8 hours.   BUN 01/31/2023 13  6 - 20 mg/dL Final   Creatinine, Ser 01/31/2023 0.80  0.61 - 1.24 mg/dL Final   Calcium 60/45/4098 8.8 (L)  8.9 - 10.3 mg/dL Final   Total Protein 11/91/4782 6.6  6.5 - 8.1 g/dL Final   Albumin 95/62/1308 3.7  3.5 - 5.0 g/dL Final   AST 65/78/4696 19  15 - 41 U/L Final   ALT 01/31/2023 15  0 - 44 U/L Final   Alkaline Phosphatase 01/31/2023 75  38 - 126 U/L Final   Total Bilirubin 01/31/2023 0.5  0.3 - 1.2 mg/dL Final   GFR, Estimated 01/31/2023 >60  >60 mL/min Final   Comment: (NOTE) Calculated using the CKD-EPI Creatinine Equation (2021)    Anion gap 01/31/2023 8  5 - 15 Final   Performed at [facility], [address] [place], [place] [address]   Troponin I (High Sensitivity) 01/31/2023 8  <18 ng/L Final   Comment: (NOTE) Elevated high sensitivity troponin I (hsTnI)  values and significant  changes across serial measurements may suggest ACS but many other  chronic and acute conditions are known to elevate hsTnI results.  Refer to the "Links" section for chest pain algorithms and additional  guidance. Performed at [facility], [address], [place], [place] [address]    Alcohol, Ethyl (B) 01/31/2023 <10  <10 mg/dL Final   Comment: (NOTE) Lowest detectable limit for serum alcohol is 10 mg/dL.  For medical purposes only. Performed at [facility], [address], [place], [place] [address]    Opiates 01/31/2023 NONE DETECTED  NONE DETECTED Final   Cocaine 01/31/2023 POSITIVE (A)  NONE DETECTED Final   Benzodiazepines 01/31/2023 POSITIVE (A)  NONE DETECTED Final   Amphetamines 01/31/2023 NONE DETECTED  NONE DETECTED Final   Tetrahydrocannabinol 01/31/2023 NONE DETECTED  NONE DETECTED Final   Barbiturates 01/31/2023 NONE DETECTED  NONE DETECTED Final   Comment: (NOTE) DRUG SCREEN FOR MEDICAL PURPOSES ONLY.  IF CONFIRMATION IS NEEDED FOR ANY PURPOSE, NOTIFY LAB WITHIN 5 DAYS.  LOWEST DETECTABLE LIMITS FOR URINE DRUG SCREEN Drug Class                     Cutoff (ng/mL) Amphetamine and metabolites    1000 Barbiturate and metabolites  200 Benzodiazepine                 200 Opiates and metabolites        300 Cocaine and metabolites        300 THC                            50 Performed at [facility], [address] [place], [place] [address]    Troponin I (High Sensitivity) 01/31/2023 5  <18 ng/L Final   Comment: (NOTE) Elevated high sensitivity troponin I (hsTnI) values and significant  changes across serial measurements may suggest ACS but many other  chronic and acute conditions are known to elevate hsTnI results.  Refer to the "Links" section for chest pain algorithms and additional  guidance. Performed at [facility], [address] [place], [place] [address]   Admission on 01/30/2023, Discharged on 01/31/2023  Component Date Value  Ref Range Status   Glucose-Capillary 01/30/2023 103 (H)  70 - 99 mg/dL Final   Glucose reference range applies only to samples taken after fasting for at least 8 hours.   WBC 01/30/2023 12.2 (H)  4.0 - 10.5 K/uL Final   RBC 01/30/2023 4.56  4.22 - 5.81 MIL/uL Final   Hemoglobin 01/30/2023 13.3  13.0 - 17.0 g/dL Final   HCT 09/81/1914 39.6  39.0 - 52.0 % Final   MCV 01/30/2023 86.8  80.0 - 100.0 fL Final   MCH 01/30/2023 29.2  26.0 - 34.0 pg Final   MCHC 01/30/2023 33.6  30.0 - 36.0 g/dL Final   RDW 78/29/5621 12.3  11.5 - 15.5 % Final   Platelets 01/30/2023 344  150 - 400 K/uL Final   nRBC 01/30/2023 0.0  0.0 - 0.2 % Final   Neutrophils Relative % 01/30/2023 80  % Final   Neutro Abs 01/30/2023 9.7 (H)  1.7 - 7.7 K/uL Final   Lymphocytes Relative 01/30/2023 11  % Final   Lymphs Abs 01/30/2023 1.4  0.7 - 4.0 K/uL Final   Monocytes Relative 01/30/2023 8  % Final   Monocytes Absolute 01/30/2023 1.0  0.1 - 1.0 K/uL Final   Eosinophils Relative 01/30/2023 1  % Final   Eosinophils Absolute 01/30/2023 0.1  0.0 - 0.5 K/uL Final   Basophils Relative 01/30/2023 0  % Final   Basophils Absolute 01/30/2023 0.0  0.0 - 0.1 K/uL Final   Immature Granulocytes 01/30/2023 0  % Final   Abs Immature Granulocytes 01/30/2023 0.04  0.00 - 0.07 K/uL Final   Performed at [facility], [address], [place], [place] [address]   Sodium 01/30/2023 135  135 - 145 mmol/L Final   Potassium 01/30/2023 4.3  3.5 - 5.1 mmol/L Final   Chloride 01/30/2023 100  98 - 111 mmol/L Final   CO2 01/30/2023 27  22 - 32 mmol/L Final   Glucose, Bld 01/30/2023 100 (H)  70 - 99 mg/dL Final   Glucose reference range applies only to samples taken after fasting for at least 8 hours.   BUN 01/30/2023 16  6 - 20 mg/dL Final   Creatinine, Ser 01/30/2023 0.83  0.61 - 1.24 mg/dL Final   Calcium 78/46/9629 8.9  8.9 - 10.3 mg/dL Final   Total Protein 52/84/1324 7.0  6.5 - 8.1 g/dL Final   Albumin 40/01/2724 4.1  3.5 - 5.0 g/dL Final   AST  36/64/4034 21  15 - 41 U/L Final   ALT 01/30/2023 14  0 - 44 U/L Final   Alkaline Phosphatase 01/30/2023 79  38 - 126 U/L Final   Total Bilirubin 01/30/2023 0.4  0.3 - 1.2 mg/dL Final   GFR, Estimated 01/30/2023 >60  >60 mL/min Final   Comment: (NOTE) Calculated using the CKD-EPI Creatinine Equation (2021)    Anion gap 01/30/2023 8  5 - 15 Final   Performed at [facility], [address] [place], [place] [address]   Opiates 01/30/2023 NONE DETECTED  NONE DETECTED Final   Cocaine 01/30/2023 POSITIVE (A)  NONE DETECTED Final   Benzodiazepines 01/30/2023 POSITIVE (A)  NONE DETECTED Final   Amphetamines 01/30/2023 NONE DETECTED  NONE DETECTED Final   Tetrahydrocannabinol 01/30/2023 NONE DETECTED  NONE DETECTED Final   Barbiturates 01/30/2023 NONE DETECTED  NONE DETECTED Final   Comment: (NOTE) DRUG SCREEN FOR MEDICAL PURPOSES ONLY.  IF CONFIRMATION IS NEEDED FOR ANY PURPOSE, NOTIFY LAB WITHIN 5 DAYS.  LOWEST DETECTABLE LIMITS FOR URINE DRUG SCREEN Drug Class                     Cutoff (ng/mL) Amphetamine and metabolites    1000 Barbiturate and metabolites    200 Benzodiazepine                 200 Opiates and metabolites        300 Cocaine and metabolites        300 THC                            50 Performed at [facility], [address] [place], [place] [address]    Troponin I (High Sensitivity) 01/30/2023 <2  <18 ng/L Final   Comment: (NOTE) Elevated high sensitivity troponin I (hsTnI) values and significant  changes across serial measurements may suggest ACS but many other  chronic and acute conditions are known to elevate hsTnI results.  Refer to the "Links" section for chest pain algorithms and additional  guidance. Performed at [facility], [address], [place], [place] [address]    Troponin I (High Sensitivity) 01/30/2023 <2  <18 ng/L Final   Comment: (NOTE) Elevated high sensitivity troponin I (hsTnI) values and significant  changes across serial measurements  may suggest ACS but many other  chronic and acute conditions are known to elevate hsTnI results.  Refer to the "Links" section for chest pain algorithms and additional  guidance. Performed at [facility], [address], [place], [place] [address]   Admission on 01/05/2023, Discharged on 01/05/2023  Component Date Value Ref Range Status   Sodium 01/05/2023 138  135 - 145 mmol/L Final   Potassium 01/05/2023 4.2  3.5 - 5.1 mmol/L Final   Chloride 01/05/2023 101  98 - 111 mmol/L Final   CO2 01/05/2023 26  22 - 32 mmol/L Final   Glucose, Bld 01/05/2023 115 (H)  70 - 99 mg/dL Final   Glucose reference range applies only to samples taken after fasting for at least 8 hours.   BUN 01/05/2023 13  6 - 20 mg/dL Final   Creatinine, Ser 01/05/2023 0.91  0.61 - 1.24 mg/dL Final   Calcium 32/44/0102 10.0  8.9 - 10.3 mg/dL Final   Total Protein 72/53/6644 7.7  6.5 - 8.1 g/dL Final   Albumin 03/47/4259 4.5  3.5 - 5.0 g/dL Final   AST 56/38/7564 20  15 - 41 U/L Final   ALT 01/05/2023 16  0 - 44 U/L Final  Alkaline Phosphatase 01/05/2023 79  38 - 126 U/L Final   Total Bilirubin 01/05/2023 0.9  0.3 - 1.2 mg/dL Final   GFR, Estimated 01/05/2023 >60  >60 mL/min Final   Comment: (NOTE) Calculated using the CKD-EPI Creatinine Equation (2021)    Anion gap 01/05/2023 11  5 - 15 Final   Performed at [facility], [address] [place], [place] [address]   Alcohol, Ethyl (B) 01/05/2023 <10  <10 mg/dL Final   Comment: (NOTE) Lowest detectable limit for serum alcohol is 10 mg/dL.  For medical purposes only. Performed at [facility], [address] [place], [place] [address]    WBC 01/05/2023 8.7  4.0 - 10.5 K/uL Final   RBC 01/05/2023 5.23  4.22 - 5.81 MIL/uL Final   Hemoglobin 01/05/2023 15.4  13.0 - 17.0 g/dL Final   HCT 09/81/1914 46.2  39.0 - 52.0 % Final   MCV 01/05/2023 88.3  80.0 - 100.0 fL Final   MCH 01/05/2023 29.4  26.0 - 34.0 pg Final   MCHC 01/05/2023 33.3  30.0 - 36.0 g/dL Final   RDW  78/29/5621 12.7  11.5 - 15.5 % Final   Platelets 01/05/2023 334  150 - 400 K/uL Final   nRBC 01/05/2023 0.0  0.0 - 0.2 % Final   Performed at [facility], [address], [place], [place] [address]   Opiates 01/05/2023 NONE DETECTED  NONE DETECTED Final   Cocaine 01/05/2023 POSITIVE (A)  NONE DETECTED Final   Benzodiazepines 01/05/2023 NONE DETECTED  NONE DETECTED Final   Amphetamines 01/05/2023 POSITIVE (A)  NONE DETECTED Final   Tetrahydrocannabinol 01/05/2023 POSITIVE (A)  NONE DETECTED Final   Barbiturates 01/05/2023 NONE DETECTED  NONE DETECTED Final   Comment: (NOTE) DRUG SCREEN FOR MEDICAL PURPOSES ONLY.  IF CONFIRMATION IS NEEDED FOR ANY PURPOSE, NOTIFY LAB WITHIN 5 DAYS.  LOWEST DETECTABLE LIMITS FOR URINE DRUG SCREEN Drug Class                     Cutoff (ng/mL) Amphetamine and metabolites    1000 Barbiturate and metabolites    200 Benzodiazepine                 200 Opiates and metabolites        300 Cocaine and metabolites        300 THC                            50 Performed at [facility], [address], [place], [place] [address]     Allergies: Peanuts [nuts], Penicillins, Shellfish allergy, and Cyclobenzaprine hcl  Medications:  Facility Ordered Medications  Medication   acetaminophen (TYLENOL) tablet 650 mg   alum & mag hydroxide-simeth (MAALOX/MYLANTA) 200-200-20 MG/5ML suspension 30 mL   magnesium hydroxide (MILK OF MAGNESIA) suspension 30 mL   dicyclomine (BENTYL) tablet 20 mg   hydrOXYzine (ATARAX) tablet 25 mg   loperamide (IMODIUM) capsule 2-4 mg   methocarbamol (ROBAXIN) tablet 500 mg   naproxen (NAPROSYN) tablet 500 mg   ondansetron (ZOFRAN-ODT) disintegrating tablet 4 mg   cloNIDine (CATAPRES) tablet 0.1 mg   Followed by   Melene Muller ON 02/04/2023] cloNIDine (CATAPRES) tablet 0.1 mg   Followed by   Melene Muller ON 02/07/2023] cloNIDine (CATAPRES) tablet 0.1 mg   nicotine (NICODERM CQ - dosed in mg/24 hours) patch 21 mg   nicotine polacrilex  (NICORETTE) gum 4 mg   QUEtiapine (SEROQUEL XR) 24 hr tablet  100 mg   sertraline (ZOLOFT) tablet 50 mg   traZODone (DESYREL) tablet 50 mg   PTA Medications  Medication Sig   albuterol (VENTOLIN HFA) 108 (90 Base) MCG/ACT inhaler Inhale 1-2 puffs into the lungs every 6 (six) hours as needed for wheezing or shortness of breath.   QUEtiapine (SEROQUEL) 100 MG tablet Take 100 mg by mouth at bedtime.   sertraline (ZOLOFT) 50 MG tablet Take 50 mg by mouth daily.    Long Term Goals: Improvement in symptoms so as ready for discharge  Short Term Goals: Patient will verbalize feelings in meetings with treatment team members., Patient will attend at least of 50% of the groups daily., Pt will complete the PHQ9 on admission, day 3 and discharge., Patient will participate in completing the [place] Suicide Severity Rating Scale, Patient will score a low risk of violence for 24 hours prior to discharge, and Patient will take medications as prescribed daily.  Medical Decision Making  Psychiatric Diagnoses: Substance induced mood disorder Opioid Use Disorder Stimulant Use Disorder Tobacco use disorder   Psychiatric Diagnoses and Treatment:  Opioid Use Disorder COWS scoring Clonidine Taper Subutex induction at least 48 hours after last use of fentanyl and confirm patient is in moderate withdrawal  before starting Subutex to avoid precipitated withdrawal PRNs Tylenol 650 mg every 6 hours as needed for mild pain Naproxen 500 mg BID as needed for pain Bentyl 20 mg every 6 hours as needed for spasms/abdominal cramping Robaxin 500 mg every 8 hours as needed for muscle spasms Zofran 4 mg every 6 hours as needed for nausea or vomiting Imodium 2 to 4 mg as needed for diarrhea or loose stools Maalox/Mylanta 30 mL every 4 hours as needed for indigestion Milk of Mag 30 mL as needed for constipation    Substance induced mood disorder vs MDD Restarted home Zoloft 50 mg daily Restarted home Seroquel 100 mg  nightly   Tobacco Use Disorder Encouraged smoking cessation Nicotine patch 21 mg daily Nicorette gum PRN   Medical Issues Being Addressed: None  Other PRNs: Trazodone 50 mg at bedtime PRN for insomnia   Other Labs/Imaging Reviewed: Hepatitis panel, A1C, Lipid, TSH pending  HIV - nonreactive CMP WNL CBC unremarkable  EKG on 02/02/2023: QTc 374   Disposition: TBD, Residential rehabilitation   Recommendations  Based on my evaluation the patient does not appear to have an emergency medical condition.  Lorri Frederick, MD 02/02/23  4:11 PM

## 2023-02-02 NOTE — Progress Notes (Signed)
Pt's COWS was 11.

## 2023-02-02 NOTE — ED Notes (Signed)
Patient observed/assessed at bedside lying in bed asleep. Patient alert and oriented to self and location. Affect is flat and patient and says he is feeling better after taking the Maalox. He denies A/V/H. He denies having any thoughts/plan of self harm and harm towards others. Fluid and snack offered. Verbalizes no further complaints at this time. Will continue to monitor and support and provide support.

## 2023-02-02 NOTE — ED Notes (Signed)
Patient is sleeping. Respirations equal and unlabored, skin warm and dry. No change in assessment or acuity. Routine safety checks conducted according to facility protocol. Will continue to monitor for safety.   

## 2023-02-03 ENCOUNTER — Encounter (HOSPITAL_COMMUNITY): Payer: Self-pay | Admitting: Student in an Organized Health Care Education/Training Program

## 2023-02-03 DIAGNOSIS — F1193 Opioid use, unspecified with withdrawal: Secondary | ICD-10-CM | POA: Diagnosis not present

## 2023-02-03 DIAGNOSIS — F1910 Other psychoactive substance abuse, uncomplicated: Secondary | ICD-10-CM | POA: Diagnosis not present

## 2023-02-03 DIAGNOSIS — F17200 Nicotine dependence, unspecified, uncomplicated: Secondary | ICD-10-CM | POA: Diagnosis not present

## 2023-02-03 DIAGNOSIS — F1010 Alcohol abuse, uncomplicated: Secondary | ICD-10-CM | POA: Diagnosis not present

## 2023-02-03 DIAGNOSIS — Z79899 Other long term (current) drug therapy: Secondary | ICD-10-CM | POA: Diagnosis not present

## 2023-02-03 DIAGNOSIS — T40411A Poisoning by fentanyl or fentanyl analogs, accidental (unintentional), initial encounter: Secondary | ICD-10-CM | POA: Diagnosis not present

## 2023-02-03 DIAGNOSIS — F1490 Cocaine use, unspecified, uncomplicated: Secondary | ICD-10-CM | POA: Diagnosis not present

## 2023-02-03 LAB — LIPID PANEL
Cholesterol: 157 mg/dL (ref 0–200)
HDL: 45 mg/dL (ref >40)
LDL Cholesterol: 97 mg/dL (ref 0–99)
Total CHOL/HDL Ratio: 3.5 {ratio}
Triglycerides: 75 mg/dL (ref <150)
VLDL: 15 mg/dL (ref 0–40)

## 2023-02-03 LAB — HEPATITIS B SURFACE ANTIBODY, QUANTITATIVE: Hep B S AB Quant (Post): 10.3 m[IU]/mL

## 2023-02-03 LAB — HEMOGLOBIN A1C
Hgb A1c MFr Bld: 5.5 % (ref 4.8–5.6)
Mean Plasma Glucose: 111.15 mg/dL

## 2023-02-03 LAB — TSH: TSH: 0.333 u[IU]/mL — ABNORMAL LOW (ref 0.350–4.500)

## 2023-02-03 MED ORDER — BUPRENORPHINE HCL 2 MG SL SUBL
2.0000 mg | SUBLINGUAL_TABLET | Freq: Once | SUBLINGUAL | Status: AC
  Administered 2023-02-04: 2 mg via SUBLINGUAL
  Filled 2023-02-03: qty 1

## 2023-02-03 NOTE — ED Notes (Signed)
 Pt was provided lunch

## 2023-02-03 NOTE — ED Notes (Signed)
Pt was provided dinner.

## 2023-02-03 NOTE — ED Provider Notes (Signed)
Behavioral Health Progress Note  Date and Time: 02/03/2023 1:31 PM Name: Eric Lowery MRN:  [id number]  Eric Lowery is a 37 yo male with a PPHx of opioid use disorder, stimulant use disorder, and tobacco use disorder, admitted to [facility] on 10/17 for detox from recent fentanyl and cocaine use with interest in Residential Rehabilitation. He reports a hx of IV drug use. UDS on admission is positive for buprenorphine, benzodiazepines, and cocaine.   Subjective:  The patient was evaluated on the unit and reports poor sleep, stating he forgot to ask for trazodone. Appetite is described as "not the best," and mood is "not good" today. The patient reports cravings and withdrawal symptoms, including body aches, chills, headaches, tremors, and lightheadedness.He vomited this morning and were encouraged to maintain oral fluid intake.  During the interview, the patient denies suicidal ideation, self-harm thoughts, homicidal ideation, auditory or visual hallucinations, paranoia, or delusions.   Pt was evaluated in the afternoon after reporting an unwitnessed fall in his room and hitting the back of his head. He denies loss of consciousness, headache, dizziness, vision changes, nausea, vomiting, weakness, numbness, or difficulty with coordination.  Substance Use Hx: Fentanyl: IV use 3.5 grams 2 days ago. Uses it daily for the past 2.5 months.  Cocaine: Report using "hours" prior to admission to [facility] on 02/02/2023, intranasal. 2.5-3.0 grams daily for /the past 2 months (since wife separated) Tobacco: 2 ppd for the past 20 years Alcohol - Drinks moonshine nightly when he comes back from work, around 0.5 gallons a night.  Patient has participated in rehab with [facility] in [place] and in [place]. Pt recently enrolled in substance abuse treatment program with [facility] but left after 15 hours because "it was a faith based program and I don't agree with their philosophy."    Past Psychiatric Hx: Current  Psychiatrist: Dr. April Manson, in [place] Previous Psychiatric Diagnoses: pt is unable to specify any formal previous psychiatric diagnoses Psychiatric medication history/compliance: reports compliance   Past Medical History: Allergies: penicillins, shellfish, peanuts Seizures: reports previous hx of epilepsy, has not had a seizure in "years", reports previously being prescribed Keppra.    Social History: Marital Status: Married, separated from wife Children: 3 children Legal: Upcoming court date in December to get his license 2/2 texting and driving. No past hx of incarcerations or legal charges.  Military: No   Diagnosis:  Final diagnoses:  Tobacco use disorder  On high dose antipsychotic drug therapy  Opioid use with withdrawal (HCC)  Alcohol abuse    Total Time spent with patient: 45 minutes   Current Medications:  Current Facility-Administered Medications  Medication Dose Route Frequency Provider Last Rate Last Admin   acetaminophen (TYLENOL) tablet 650 mg  650 mg Oral Q6H PRN Meryl Dare, MD       alum & mag hydroxide-simeth (MAALOX/MYLANTA) 200-200-20 MG/5ML suspension 30 mL  30 mL Oral Q4H PRN Meryl Dare, MD   30 mL at 02/02/23 1916   [START ON 02/04/2023] buprenorphine (SUBUTEX) SL tablet 2 mg  2 mg Sublingual Once Carrion-Carrero, Cheryn Lundquist, MD       cloNIDine (CATAPRES) tablet 0.1 mg  0.1 mg Oral QID Meryl Dare, MD   0.1 mg at 02/03/23 5366   Followed by   Melene Muller ON 02/04/2023] cloNIDine (CATAPRES) tablet 0.1 mg  0.1 mg Oral Richardine Service, MD       Followed by   Melene Muller ON 02/07/2023] cloNIDine (CATAPRES) tablet 0.1 mg  0.1 mg Oral QAC breakfast  Meryl Dare, MD       dicyclomine (BENTYL) tablet 20 mg  20 mg Oral Q6H PRN Meryl Dare, MD   20 mg at 02/03/23 8657   hydrOXYzine (ATARAX) tablet 25 mg  25 mg Oral Q6H PRN Meryl Dare, MD   25 mg at 02/03/23 8469   loperamide (IMODIUM) capsule 2-4 mg  2-4 mg Oral PRN Meryl Dare, MD        magnesium hydroxide (MILK OF MAGNESIA) suspension 30 mL  30 mL Oral Daily PRN Meryl Dare, MD       methocarbamol (ROBAXIN) tablet 500 mg  500 mg Oral Q8H PRN Meryl Dare, MD   500 mg at 02/03/23 0834   naproxen (NAPROSYN) tablet 500 mg  500 mg Oral BID PRN Meryl Dare, MD   500 mg at 02/03/23 0834   nicotine (NICODERM CQ - dosed in mg/24 hours) patch 21 mg  21 mg Transdermal Daily Meryl Dare, MD   21 mg at 02/03/23 6295   nicotine polacrilex (NICORETTE) gum 4 mg  4 mg Oral PRN Meryl Dare, MD   4 mg at 02/03/23 0834   ondansetron (ZOFRAN-ODT) disintegrating tablet 4 mg  4 mg Oral Q6H PRN Meryl Dare, MD   4 mg at 02/02/23 1515   QUEtiapine (SEROQUEL XR) 24 hr tablet 100 mg  100 mg Oral QHS Carrion-Carrero, Atwell Mcdanel, MD   100 mg at 02/02/23 2136   sertraline (ZOLOFT) tablet 50 mg  50 mg Oral Daily Carrion-Carrero, Karle Starch, MD   50 mg at 02/03/23 2841   traZODone (DESYREL) tablet 50 mg  50 mg Oral QHS PRN Carrion-Carrero, Karle Starch, MD       Current Outpatient Medications  Medication Sig Dispense Refill   albuterol (VENTOLIN HFA) 108 (90 Base) MCG/ACT inhaler Inhale 1-2 puffs into the lungs every 6 (six) hours as needed for wheezing or shortness of breath.     QUEtiapine (SEROQUEL) 100 MG tablet Take 100 mg by mouth at bedtime.     sertraline (ZOLOFT) 50 MG tablet Take 50 mg by mouth daily.      Labs  Lab Results:  Admission on 02/02/2023  Component Date Value Ref Range Status   Hgb A1c MFr Bld 02/03/2023 5.5  4.8 - 5.6 % Final   Comment: (NOTE) Pre diabetes:          5.7%-6.4%  Diabetes:              >6.4%  Glycemic control for   <7.0% adults with diabetes    Mean Plasma Glucose 02/03/2023 111.15  mg/dL Final   Performed at [facility], [address], [place], [place] [address]   Cholesterol 02/03/2023 157  0 - 200 mg/dL Final   Triglycerides 02/12/2535 75  <150 mg/dL Final   HDL 64/40/3474 45  >40 mg/dL Final   Total CHOL/HDL Ratio 02/03/2023 3.5  RATIO  Final   VLDL 02/03/2023 15  0 - 40 mg/dL Final   LDL Cholesterol 02/03/2023 97  0 - 99 mg/dL Final   Comment:        Total Cholesterol/HDL:CHD Risk Coronary Heart Disease Risk Table                     Men   Women  1/2 Average Risk   3.4   3.3  Average Risk       5.0   4.4  2 X Average Risk   9.6   7.1  3 X Average Risk  23.4   11.0        Use the calculated Patient Ratio above and the CHD Risk Table to determine the patient's CHD Risk.        ATP III CLASSIFICATION (LDL):  <100     mg/dL   Optimal  578-469  mg/dL   Near or Above                    Optimal  130-159  mg/dL   Borderline  629-528  mg/dL   High  >413     mg/dL   Very High Performed at [facility], [address] [place], [place] [address]    TSH 02/03/2023 0.333 (L)  0.350 - 4.500 uIU/mL Final   Comment: Performed by a 3rd Generation assay with a functional sensitivity of <=0.01 uIU/mL. Performed at [facility], [address], [place], [place] [address]   Admission on 02/02/2023, Discharged on 02/02/2023  Component Date Value Ref Range Status   HIV Screen 4th Generation wRfx 02/02/2023 Non Reactive  Non Reactive Final   Performed at [facility], [address], [place], [place] [address]   POC Amphetamine UR 02/02/2023 None Detected  NONE DETECTED (Cut Off Level 1000 ng/mL) Final   POC Secobarbital (BAR) 02/02/2023 None Detected  NONE DETECTED (Cut Off Level 300 ng/mL) Final   POC Buprenorphine (BUP) 02/02/2023 Positive (A)  NONE DETECTED (Cut Off Level 10 ng/mL) Final   POC Oxazepam (BZO) 02/02/2023 Positive (A)  NONE DETECTED (Cut Off Level 300 ng/mL) Final   POC Cocaine UR 02/02/2023 Positive (A)  NONE DETECTED (Cut Off Level 300 ng/mL) Final   POC Methamphetamine UR 02/02/2023 None Detected  NONE DETECTED (Cut Off Level 1000 ng/mL) Final   POC Morphine 02/02/2023 None Detected  NONE DETECTED (Cut Off Level 300 ng/mL) Final   POC Methadone UR 02/02/2023 None Detected  NONE DETECTED (Cut  Off Level 300 ng/mL) Final   POC Oxycodone UR 02/02/2023 None Detected  NONE DETECTED (Cut Off Level 100 ng/mL) Final   POC Marijuana UR 02/02/2023 None Detected  NONE DETECTED (Cut Off Level 50 ng/mL) Final   TSH 02/02/2023 0.244 (L)  0.350 - 4.500 uIU/mL Final   Comment: Performed by a 3rd Generation assay with a functional sensitivity of <=0.01 uIU/mL. Performed at [facility], [address], [place], [place] [address]    Cholesterol 02/02/2023 177  0 - 200 mg/dL Final   Triglycerides 25/95/6387 88  <150 mg/dL Final   HDL 56/43/3295 58  >40 mg/dL Final   Total CHOL/HDL Ratio 02/02/2023 3.1  RATIO Final   VLDL 02/02/2023 18  0 - 40 mg/dL Final   LDL Cholesterol 02/02/2023 101 (H)  0 - 99 mg/dL Final   Comment:        Total Cholesterol/HDL:CHD Risk Coronary Heart Disease Risk Table                     Men   Women  1/2 Average Risk   3.4   3.3  Average Risk       5.0   4.4  2 X Average Risk   9.6   7.1  3 X Average Risk  23.4   11.0        Use the calculated Patient Ratio above and the CHD Risk Table to determine the patient's CHD Risk.        ATP III CLASSIFICATION (LDL):  <100  mg/dL   Optimal  161-096  mg/dL   Near or Above                    Optimal  130-159  mg/dL   Borderline  045-409  mg/dL   High  >811     mg/dL   Very High Performed at [facility], [address], [place], [place] [address]    Hep B S AB Quant (Post) 02/02/2023 10.3  Immunity>10 mIU/mL Final   Comment: (NOTE)  Status of Immunity                     Anti-HBs Level  ------------------                     -------------- Inconsistent with Immunity                  0.0 - 10.0 Consistent with Immunity                         >10.0 Performed At: [facility] [place] [address] [place], [place] [id number] Jolene Schimke MD MV:[id number]   Admission on 01/31/2023, Discharged on 01/31/2023  Component Date Value Ref Range Status   WBC 01/31/2023 6.4  4.0 - 10.5 K/uL Final   RBC  01/31/2023 4.43  4.22 - 5.81 MIL/uL Final   Hemoglobin 01/31/2023 13.1  13.0 - 17.0 g/dL Final   HCT 84/13/2440 38.7 (L)  39.0 - 52.0 % Final   MCV 01/31/2023 87.4  80.0 - 100.0 fL Final   MCH 01/31/2023 29.6  26.0 - 34.0 pg Final   MCHC 01/31/2023 33.9  30.0 - 36.0 g/dL Final   RDW 02/12/2535 12.4  11.5 - 15.5 % Final   Platelets 01/31/2023 311  150 - 400 K/uL Final   nRBC 01/31/2023 0.0  0.0 - 0.2 % Final   Neutrophils Relative % 01/31/2023 55  % Final   Neutro Abs 01/31/2023 3.5  1.7 - 7.7 K/uL Final   Lymphocytes Relative 01/31/2023 30  % Final   Lymphs Abs 01/31/2023 1.9  0.7 - 4.0 K/uL Final   Monocytes Relative 01/31/2023 12  % Final   Monocytes Absolute 01/31/2023 0.8  0.1 - 1.0 K/uL Final   Eosinophils Relative 01/31/2023 3  % Final   Eosinophils Absolute 01/31/2023 0.2  0.0 - 0.5 K/uL Final   Basophils Relative 01/31/2023 0  % Final   Basophils Absolute 01/31/2023 0.0  0.0 - 0.1 K/uL Final   Immature Granulocytes 01/31/2023 0  % Final   Abs Immature Granulocytes 01/31/2023 0.01  0.00 - 0.07 K/uL Final   Performed at [facility], [address], [place], [place] [address]   Sodium 01/31/2023 136  135 - 145 mmol/L Final   Potassium 01/31/2023 3.9  3.5 - 5.1 mmol/L Final   Chloride 01/31/2023 100  98 - 111 mmol/L Final   CO2 01/31/2023 28  22 - 32 mmol/L Final   Glucose, Bld 01/31/2023 83  70 - 99 mg/dL Final   Glucose reference range applies only to samples taken after fasting for at least 8 hours.   BUN 01/31/2023 13  6 - 20 mg/dL Final   Creatinine, Ser 01/31/2023 0.80  0.61 - 1.24 mg/dL Final   Calcium 47/42/5956 8.8 (L)  8.9 - 10.3 mg/dL Final   Total Protein 38/75/6433 6.6  6.5 - 8.1 g/dL Final   Albumin 29/51/8841 3.7  3.5 -  5.0 g/dL Final   AST 40/98/1191 19  15 - 41 U/L Final   ALT 01/31/2023 15  0 - 44 U/L Final   Alkaline Phosphatase 01/31/2023 75  38 - 126 U/L Final   Total Bilirubin 01/31/2023 0.5  0.3 - 1.2 mg/dL Final   GFR, Estimated 01/31/2023 >60  >60  mL/min Final   Comment: (NOTE) Calculated using the CKD-EPI Creatinine Equation (2021)    Anion gap 01/31/2023 8  5 - 15 Final   Performed at [facility], [address] [place], [place] [address]   Troponin I (High Sensitivity) 01/31/2023 8  <18 ng/L Final   Comment: (NOTE) Elevated high sensitivity troponin I (hsTnI) values and significant  changes across serial measurements may suggest ACS but many other  chronic and acute conditions are known to elevate hsTnI results.  Refer to the "Links" section for chest pain algorithms and additional  guidance. Performed at [facility], [address] [place], [place] [address]    Alcohol, Ethyl (B) 01/31/2023 <10  <10 mg/dL Final   Comment: (NOTE) Lowest detectable limit for serum alcohol is 10 mg/dL.  For medical purposes only. Performed at [facility], [address], [place], [place] [address]    Opiates 01/31/2023 NONE DETECTED  NONE DETECTED Final   Cocaine 01/31/2023 POSITIVE (A)  NONE DETECTED Final   Benzodiazepines 01/31/2023 POSITIVE (A)  NONE DETECTED Final   Amphetamines 01/31/2023 NONE DETECTED  NONE DETECTED Final   Tetrahydrocannabinol 01/31/2023 NONE DETECTED  NONE DETECTED Final   Barbiturates 01/31/2023 NONE DETECTED  NONE DETECTED Final   Comment: (NOTE) DRUG SCREEN FOR MEDICAL PURPOSES ONLY.  IF CONFIRMATION IS NEEDED FOR ANY PURPOSE, NOTIFY LAB WITHIN 5 DAYS.  LOWEST DETECTABLE LIMITS FOR URINE DRUG SCREEN Drug Class                     Cutoff (ng/mL) Amphetamine and metabolites    1000 Barbiturate and metabolites    200 Benzodiazepine                 200 Opiates and metabolites        300 Cocaine and metabolites        300 THC                            50 Performed at [facility], [address], [place], [place] [address]    Troponin I (High Sensitivity) 01/31/2023 5  <18 ng/L Final   Comment: (NOTE) Elevated high sensitivity troponin I (hsTnI) values and significant  changes across serial  measurements may suggest ACS but many other  chronic and acute conditions are known to elevate hsTnI results.  Refer to the "Links" section for chest pain algorithms and additional  guidance. Performed at [facility], [address] [place], [place] [address]   Admission on 01/30/2023, Discharged on 01/31/2023  Component Date Value Ref Range Status   Glucose-Capillary 01/30/2023 103 (H)  70 - 99 mg/dL Final   Glucose reference range applies only to samples taken after fasting for at least 8 hours.   WBC 01/30/2023 12.2 (H)  4.0 - 10.5 K/uL Final   RBC 01/30/2023 4.56  4.22 - 5.81 MIL/uL Final   Hemoglobin 01/30/2023 13.3  13.0 - 17.0 g/dL Final   HCT 41/32/4401 39.6  39.0 - 52.0 % Final   MCV 01/30/2023 86.8  80.0 - 100.0 fL Final   MCH 01/30/2023 29.2  26.0 -  34.0 pg Final   MCHC 01/30/2023 33.6  30.0 - 36.0 g/dL Final   RDW 62/13/0865 12.3  11.5 - 15.5 % Final   Platelets 01/30/2023 344  150 - 400 K/uL Final   nRBC 01/30/2023 0.0  0.0 - 0.2 % Final   Neutrophils Relative % 01/30/2023 80  % Final   Neutro Abs 01/30/2023 9.7 (H)  1.7 - 7.7 K/uL Final   Lymphocytes Relative 01/30/2023 11  % Final   Lymphs Abs 01/30/2023 1.4  0.7 - 4.0 K/uL Final   Monocytes Relative 01/30/2023 8  % Final   Monocytes Absolute 01/30/2023 1.0  0.1 - 1.0 K/uL Final   Eosinophils Relative 01/30/2023 1  % Final   Eosinophils Absolute 01/30/2023 0.1  0.0 - 0.5 K/uL Final   Basophils Relative 01/30/2023 0  % Final   Basophils Absolute 01/30/2023 0.0  0.0 - 0.1 K/uL Final   Immature Granulocytes 01/30/2023 0  % Final   Abs Immature Granulocytes 01/30/2023 0.04  0.00 - 0.07 K/uL Final   Performed at [facility], [address] [place], [place] [address]   Sodium 01/30/2023 135  135 - 145 mmol/L Final   Potassium 01/30/2023 4.3  3.5 - 5.1 mmol/L Final   Chloride 01/30/2023 100  98 - 111 mmol/L Final   CO2 01/30/2023 27  22 - 32 mmol/L Final   Glucose, Bld 01/30/2023 100 (H)  70 - 99 mg/dL Final   Glucose  reference range applies only to samples taken after fasting for at least 8 hours.   BUN 01/30/2023 16  6 - 20 mg/dL Final   Creatinine, Ser 01/30/2023 0.83  0.61 - 1.24 mg/dL Final   Calcium 62/95/2841 8.9  8.9 - 10.3 mg/dL Final   Total Protein 32/44/0102 7.0  6.5 - 8.1 g/dL Final   Albumin 72/53/6644 4.1  3.5 - 5.0 g/dL Final   AST 03/47/4259 21  15 - 41 U/L Final   ALT 01/30/2023 14  0 - 44 U/L Final   Alkaline Phosphatase 01/30/2023 79  38 - 126 U/L Final   Total Bilirubin 01/30/2023 0.4  0.3 - 1.2 mg/dL Final   GFR, Estimated 01/30/2023 >60  >60 mL/min Final   Comment: (NOTE) Calculated using the CKD-EPI Creatinine Equation (2021)    Anion gap 01/30/2023 8  5 - 15 Final   Performed at [facility], [address] [place], [place] [address]   Opiates 01/30/2023 NONE DETECTED  NONE DETECTED Final   Cocaine 01/30/2023 POSITIVE (A)  NONE DETECTED Final   Benzodiazepines 01/30/2023 POSITIVE (A)  NONE DETECTED Final   Amphetamines 01/30/2023 NONE DETECTED  NONE DETECTED Final   Tetrahydrocannabinol 01/30/2023 NONE DETECTED  NONE DETECTED Final   Barbiturates 01/30/2023 NONE DETECTED  NONE DETECTED Final   Comment: (NOTE) DRUG SCREEN FOR MEDICAL PURPOSES ONLY.  IF CONFIRMATION IS NEEDED FOR ANY PURPOSE, NOTIFY LAB WITHIN 5 DAYS.  LOWEST DETECTABLE LIMITS FOR URINE DRUG SCREEN Drug Class                     Cutoff (ng/mL) Amphetamine and metabolites    1000 Barbiturate and metabolites    200 Benzodiazepine                 200 Opiates and metabolites        300 Cocaine and metabolites        300 THC  50 Performed at [facility], [address] [place], [place] [address]    Troponin I (High Sensitivity) 01/30/2023 <2  <18 ng/L Final   Comment: (NOTE) Elevated high sensitivity troponin I (hsTnI) values and significant  changes across serial measurements may suggest ACS but many other  chronic and acute conditions are known to elevate hsTnI results.   Refer to the "Links" section for chest pain algorithms and additional  guidance. Performed at [facility], [address] [place], [place] [address]    Troponin I (High Sensitivity) 01/30/2023 <2  <18 ng/L Final   Comment: (NOTE) Elevated high sensitivity troponin I (hsTnI) values and significant  changes across serial measurements may suggest ACS but many other  chronic and acute conditions are known to elevate hsTnI results.  Refer to the "Links" section for chest pain algorithms and additional  guidance. Performed at [facility], [address], [place], [place] [address]   Admission on 01/05/2023, Discharged on 01/05/2023  Component Date Value Ref Range Status   Sodium 01/05/2023 138  135 - 145 mmol/L Final   Potassium 01/05/2023 4.2  3.5 - 5.1 mmol/L Final   Chloride 01/05/2023 101  98 - 111 mmol/L Final   CO2 01/05/2023 26  22 - 32 mmol/L Final   Glucose, Bld 01/05/2023 115 (H)  70 - 99 mg/dL Final   Glucose reference range applies only to samples taken after fasting for at least 8 hours.   BUN 01/05/2023 13  6 - 20 mg/dL Final   Creatinine, Ser 01/05/2023 0.91  0.61 - 1.24 mg/dL Final   Calcium 91/47/8295 10.0  8.9 - 10.3 mg/dL Final   Total Protein 62/13/0865 7.7  6.5 - 8.1 g/dL Final   Albumin 78/46/9629 4.5  3.5 - 5.0 g/dL Final   AST 52/84/1324 20  15 - 41 U/L Final   ALT 01/05/2023 16  0 - 44 U/L Final   Alkaline Phosphatase 01/05/2023 79  38 - 126 U/L Final   Total Bilirubin 01/05/2023 0.9  0.3 - 1.2 mg/dL Final   GFR, Estimated 01/05/2023 >60  >60 mL/min Final   Comment: (NOTE) Calculated using the CKD-EPI Creatinine Equation (2021)    Anion gap 01/05/2023 11  5 - 15 Final   Performed at [facility], [address] [place], [place] [address]   Alcohol, Ethyl (B) 01/05/2023 <10  <10 mg/dL Final   Comment: (NOTE) Lowest detectable limit for serum alcohol is 10 mg/dL.  For medical purposes only. Performed at [facility], [address] [place], [place]  [address]    WBC 01/05/2023 8.7  4.0 - 10.5 K/uL Final   RBC 01/05/2023 5.23  4.22 - 5.81 MIL/uL Final   Hemoglobin 01/05/2023 15.4  13.0 - 17.0 g/dL Final   HCT 64/40/3474 46.2  39.0 - 52.0 % Final   MCV 01/05/2023 88.3  80.0 - 100.0 fL Final   MCH 01/05/2023 29.4  26.0 - 34.0 pg Final   MCHC 01/05/2023 33.3  30.0 - 36.0 g/dL Final   RDW 25/95/6387 12.7  11.5 - 15.5 % Final   Platelets 01/05/2023 334  150 - 400 K/uL Final   nRBC 01/05/2023 0.0  0.0 - 0.2 % Final   Performed at [facility], [address], [place], [place] [address]   Opiates 01/05/2023 NONE DETECTED  NONE DETECTED Final   Cocaine 01/05/2023 POSITIVE (A)  NONE DETECTED Final   Benzodiazepines 01/05/2023 NONE DETECTED  NONE DETECTED Final   Amphetamines 01/05/2023 POSITIVE (A)  NONE  DETECTED Final   Tetrahydrocannabinol 01/05/2023 POSITIVE (A)  NONE DETECTED Final   Barbiturates 01/05/2023 NONE DETECTED  NONE DETECTED Final   Comment: (NOTE) DRUG SCREEN FOR MEDICAL PURPOSES ONLY.  IF CONFIRMATION IS NEEDED FOR ANY PURPOSE, NOTIFY LAB WITHIN 5 DAYS.  LOWEST DETECTABLE LIMITS FOR URINE DRUG SCREEN Drug Class                     Cutoff (ng/mL) Amphetamine and metabolites    1000 Barbiturate and metabolites    200 Benzodiazepine                 200 Opiates and metabolites        300 Cocaine and metabolites        300 THC                            50 Performed at [facility], [address], [place], [place] [address]     Blood Alcohol level:  Lab Results  Component Value Date   [facility] <10 01/31/2023   ETH <10 01/05/2023    Metabolic Disorder Labs: Lab Results  Component Value Date   HGBA1C 5.5 02/03/2023   MPG 111.15 02/03/2023   No results found for: "PROLACTIN" Lab Results  Component Value Date   CHOL 157 02/03/2023   TRIG 75 02/03/2023   HDL 45 02/03/2023   CHOLHDL 3.5 02/03/2023   VLDL 15 02/03/2023   LDLCALC 97 02/03/2023   LDLCALC 101 (H) 02/02/2023    Therapeutic Lab Levels: No results  found for: "LITHIUM" No results found for: "VALPROATE" No results found for: "CBMZ"  Physical Findings   AUDIT    Flowsheet Row ED from 02/02/2023 in [facility]  Alcohol Use Disorder Identification Test Final Score (AUDIT) 34      PHQ2-9    Flowsheet Row ED from 02/02/2023 in [facility] Office Visit from 01/04/2022 in [facility]  PHQ-2 Total Score 6 0  PHQ-9 Total Score 26 --      Flowsheet Row ED from 02/02/2023 in [facility] Most recent reading at 02/02/2023  2:13 PM ED from 02/02/2023 in [facility] Most recent reading at 02/02/2023 11:41 AM ED from 01/31/2023 in [facility] Emergency Department at [facility] Most recent reading at 01/31/2023  9:44 AM  C-SSRS RISK CATEGORY No Risk No Risk No Risk        Musculoskeletal  Strength & Muscle Tone: within normal limits Gait & Station: normal Patient leans: N/A  Psychiatric Specialty Exam  Presentation  General Appearance:  Appropriate for Environment  Eye Contact: Fair  Speech: Clear and Coherent; Normal Rate  Speech Volume: Normal  Handedness: -- (not assessed)   Mood and Affect  Mood: -- ("Not good")  Affect: Congruent; Full Range   Thought Process  Thought Processes: Linear  Descriptions of Associations:Intact  Orientation:None  Thought Content:Logical  Diagnosis of Schizophrenia or Schizoaffective disorder in past: No    Hallucinations:Hallucinations: None  Ideas of Reference:None  Suicidal Thoughts:Suicidal Thoughts: No  Homicidal Thoughts:Homicidal Thoughts: No   Sensorium  Memory: Immediate Good; Recent Good; Remote Good  Judgment: Fair  Insight: Shallow   Executive Functions  Concentration: Fair  Attention Span: Fair  Recall: Fair  Fund of Knowledge: Fair  Language: Fair   Psychomotor Activity   Psychomotor Activity: Psychomotor Activity: Normal   Assets  Assets: Desire for Improvement; Resilience; Communication Skills   Sleep  Sleep: Sleep: Poor   Nutritional Assessment (For OBS and FBC admissions only) Has the patient had a weight loss or gain of 10 pounds or more in the last 3 months?: No Has the patient had a decrease in food intake/or appetite?: No Does the patient have dental problems?: No Does the patient have eating habits or behaviors that may be indicators of an eating disorder including binging or inducing vomiting?: No Has the patient recently lost weight without trying?: 0 Has the patient been eating poorly because of a decreased appetite?: 0 Malnutrition Screening Tool Score: 0    Physical Exam  Physical Exam Vitals and nursing note reviewed.  Constitutional:      General: He is not in acute distress.    Appearance: He is ill-appearing.  HENT:     Head: Normocephalic and atraumatic.  Pulmonary:     Effort: Pulmonary effort is normal. No respiratory distress.  Skin:    General: Skin is warm and dry.  Neurological:     General: No focal deficit present.     Mental Status: He is oriented to person, place, and time. He is lethargic.     Cranial Nerves: No cranial nerve deficit or facial asymmetry.     Sensory: No sensory deficit.     Motor: No weakness.     Coordination: Coordination normal.     Gait: Gait normal.    Review of Systems  Constitutional:  Positive for malaise/fatigue.  Respiratory:  Negative for shortness of breath.   Cardiovascular:  Negative for chest pain.  Gastrointestinal:  Positive for nausea and vomiting.  Musculoskeletal:  Positive for myalgias.   Blood pressure 111/69, pulse 75, temperature 98.1 F (36.7 C), temperature source Oral, resp. rate 20, SpO2 99%. There is no height or weight on file to calculate BMI.  Treatment Plan Summary: Daily contact with patient to assess and evaluate symptoms and progress in  treatment and Medication management  Long Term Goals: Improvement in symptoms so as ready for discharge   Short Term Goals: Patient will verbalize feelings in meetings with treatment team members., Patient will attend at least of 50% of the groups daily., Pt will complete the PHQ9 on admission, day 3 and discharge., Patient will participate in completing the [place] Suicide Severity Rating Scale, Patient will score a low risk of violence for 24 hours prior to discharge, and Patient will take medications as prescribed daily.   Medical Decision Making  Psychiatric Diagnoses: Substance induced mood disorder Opioid Use Disorder Stimulant Use Disorder Tobacco use disorder Alcohol use disorder     Psychiatric Diagnoses and Treatment:  Opioid Use Disorder COWS scoring 9 at 6:41 AM on 10/18 Clonidine Taper Subutex induction schedule, 2 mg once at 8 AM on 10/19--> with plan to transition to BID dosing if pt tolerates medication PRNs Tylenol 650 mg every 6 hours as needed for mild pain Naproxen 500 mg BID as needed for pain Bentyl 20 mg every 6 hours as needed for spasms/abdominal cramping Robaxin 500 mg every 8 hours as needed for muscle spasms Zofran 4 mg every 6 hours as needed for nausea or vomiting Imodium 2 to 4 mg as needed for diarrhea or loose stools Maalox/Mylanta 30 mL every 4 hours as needed for indigestion Milk of Mag 30 mL as needed for constipation    Alcohol Use Disorder CIWA 5 at 6:40 AM on 10/18, showing mild withdrawal   Substance induced mood disorder  vs MDD  Restarted home Zoloft 50 mg daily Restarted home Seroquel 100 mg nightly A1C 5.5% Lipid WNL Trazodone 50 mg at bedtime PRN for insomnia   Tobacco Use Disorder Encouraged smoking cessation Nicotine patch 21 mg daily Nicorette gum PRN   Medical Issues Being Addressed:   02/03/2023:  Unwitnessed fall reported by patient on 10/18 in the afternoon. Neurological exam is unremarkable, no hematoma or redness in  the occipital region of head. Do not believe patient required CT imaging at this time.   #Subclinical Hyperthyroidism TSH (0.333 mIU/L) on 02/03/2023 Free T3 pending  Free T4 pending TSI pending    Other Labs/Imaging Reviewed: Hepatitis panel 10.3, showing immunity HIV - nonreactive CMP WNL CBC unremarkable   EKG on 02/02/2023: QTc 374     Disposition: Residential Rehabilitation, patient to call [facility] and [facility] to complete phone screening when able.    Signed: Lorri Frederick, MD 02/03/2023 1:31 PM

## 2023-02-03 NOTE — Group Note (Signed)
Group Topic: Recovery Basics  Group Date: 02/03/2023 Start Time: 2015 End Time: 2045 Facilitators: Guss Bunde  Department: [facility]  Number of Participants: 4  Group Focus: check in Treatment Modality:  Psychoeducation Interventions utilized were group exercise Purpose: reinforce self-care  Name: Eric Lowery Date of Birth: 1985/05/14  MR: [id number]    Level of Participation: active Quality of Participation: attentive Interactions with others: gave feedback Mood/Affect: appropriate Triggers (if applicable):  Cognition: goal directed Progress: Gaining insight Response: Pt wants to get better and 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 Plan: patient will be encouraged to follow up with goals  Patients Problems:  Patient Active Problem List   Diagnosis Date Noted   Opioid use disorder, severe, dependence (HCC) 02/02/2023   On high dose antipsychotic drug therapy 02/02/2023

## 2023-02-03 NOTE — ED Notes (Signed)
Pt c/o increased anxiety at 8/10. Pt was offered medication. Hydroxyzine 25 mg, PRN given.

## 2023-02-03 NOTE — ED Notes (Signed)
Pt reports anxiety decreased with medication.

## 2023-02-03 NOTE — Plan of Care (Signed)
[facility] Tonsillectomy/Adenoidectomy, Postoperative PEDS care plan entered in error.

## 2023-02-03 NOTE — Group Note (Signed)
Group Topic: Recovery Basics  Group Date: 02/03/2023 Start Time: 1000 End Time: 1040 Facilitators: Londell Moh, NT  Department: [facility]  Number of Participants: 3  Group Focus: check in Treatment Modality:  Psychoeducation and Spiritual Interventions utilized were patient education Purpose: increase insight  Name: KEYLAN HOPE Date of Birth: [birth year]  MR: [id number]    Level of Participation: active Quality of Participation: attentive Interactions with others: gave feedback Mood/Affect: appropriate Triggers (if applicable): n/a Cognition: coherent/clear Progress: Gaining insight Response: Pt was active during group and was appropriate in his responses. Plan: patient will be encouraged to continue to attend groups  Patients Problems:  Patient Active Problem List   Diagnosis Date Noted   Opioid use disorder, severe, dependence (HCC) 02/02/2023   On high dose antipsychotic drug therapy 02/02/2023

## 2023-02-03 NOTE — ED Notes (Signed)
Patient is sleeping. Respirations equal and unlabored, skin warm and dry. No change in assessment or acuity. Routine safety checks conducted according to facility protocol. Will continue to monitor for safety.   

## 2023-02-03 NOTE — ED Notes (Signed)
Pt was provided breakfast.

## 2023-02-03 NOTE — ED Notes (Signed)
Pt is in the dayroom watching TV with peers. Pt denies SI/HI/AVH. No acute distress noted. Will continue to monitor for safety. 

## 2023-02-03 NOTE — Discharge Planning (Signed)
Referrals have been sent to the following agencies for review:  [facility]: Patient is appropriate for services however, patient has a $7200 deductible and they would need $5000 upfront for admission. Agency aware that LCSW will have conversation with patient and then follow up.  [facility] in [place]: Patient can call in the complete phone screening.  [employer]: awaiting verification of benefits.  [facility]: Patient can call in to complete phone screening.    LCSW followed up with patient to see how he was doing since admission. Patient reports he feels better than yesterday. LCSW provided an update to the patient regarding the facilities listed above. Patient to call [facility] and [facility] to complete phone screening when able. LCSW will continue to follow and provide support to patient while on [facility] unit.    Fernande Boyden, LCSW Clinical Social Worker [facility] Ph: [phone]

## 2023-02-04 DIAGNOSIS — T40411A Poisoning by fentanyl or fentanyl analogs, accidental (unintentional), initial encounter: Secondary | ICD-10-CM | POA: Diagnosis not present

## 2023-02-04 DIAGNOSIS — F1910 Other psychoactive substance abuse, uncomplicated: Secondary | ICD-10-CM | POA: Diagnosis not present

## 2023-02-04 DIAGNOSIS — F1010 Alcohol abuse, uncomplicated: Secondary | ICD-10-CM | POA: Diagnosis not present

## 2023-02-04 DIAGNOSIS — Z79899 Other long term (current) drug therapy: Secondary | ICD-10-CM | POA: Diagnosis not present

## 2023-02-04 DIAGNOSIS — F1490 Cocaine use, unspecified, uncomplicated: Secondary | ICD-10-CM | POA: Diagnosis not present

## 2023-02-04 DIAGNOSIS — F1193 Opioid use, unspecified with withdrawal: Secondary | ICD-10-CM | POA: Diagnosis not present

## 2023-02-04 DIAGNOSIS — F17200 Nicotine dependence, unspecified, uncomplicated: Secondary | ICD-10-CM | POA: Diagnosis not present

## 2023-02-04 LAB — HCV RNA QUANT: HCV Quantitative: NOT DETECTED [IU]/mL (ref >50)

## 2023-02-04 LAB — T4, FREE: Free T4: 0.92 ng/dL (ref 0.61–1.12)

## 2023-02-04 MED ORDER — TRAZODONE HCL 50 MG PO TABS
50.0000 mg | ORAL_TABLET | Freq: Every evening | ORAL | Status: DC | PRN
  Administered 2023-02-04 – 2023-02-06 (×4): 50 mg via ORAL
  Filled 2023-02-04 (×4): qty 1

## 2023-02-04 MED ORDER — PRAZOSIN HCL 1 MG PO CAPS
1.0000 mg | ORAL_CAPSULE | Freq: Every day | ORAL | Status: DC
  Administered 2023-02-04 – 2023-02-05 (×2): 1 mg via ORAL
  Filled 2023-02-04 (×2): qty 1

## 2023-02-04 MED ORDER — BUPRENORPHINE HCL 2 MG SL SUBL
2.0000 mg | SUBLINGUAL_TABLET | Freq: Two times a day (BID) | SUBLINGUAL | Status: DC
  Administered 2023-02-05 – 2023-02-06 (×3): 2 mg via SUBLINGUAL
  Filled 2023-02-04 (×3): qty 1

## 2023-02-04 NOTE — ED Notes (Signed)
CIWA 15, pt reports anxiety 10/10. Active dry heaves, denies vomiting. PRN medications given

## 2023-02-04 NOTE — Group Note (Addendum)
Group Topic: Positive Affirmations  Group Date: 02/04/2023 Start Time: 1000 End Time: 1020 Facilitators: Priscille Kluver, NT  Department: [facility]  Number of Participants:0 Group Focus: self-awareness Treatment Modality:  Spiritual Interventions utilized were support Purpose: regain self-worth  Name: Eric Lowery Date of Birth: Jan 15, 1986  MR: [id number]    Level of Participation: Pt is speaking with the doctor.  Patients Problems:  Patient Active Problem List   Diagnosis Date Noted   Opioid use disorder, severe, dependence (HCC) 02/02/2023   On high dose antipsychotic drug therapy 02/02/2023

## 2023-02-04 NOTE — ED Notes (Addendum)
Patient alert & oriented x4. Denies intent to harm self or others when asked, although initially responded "not today". When further questions were asked regarding this, patient stated "I couldn't harm anybody" and upon initial verbal contracting for safety patient stated "probably not" when asked to inform staff of any thoughts to harm self or others. Patient then said "sure" after additional questions were asked. Denies A/VH at this time. Patient reported pain of 7/10 in neck, PRN tylenol given. Patient requested additional nicotine, PRN gum given, education regarding proper use given, patient verbalized understanding and thanked staff stating they "can feel it working now". No acute distress noted. Support and encouragement provided. Routine safety checks conducted per facility protocol. Encouraged patient to notify staff if any thoughts of harm towards self or others arise. Patient verbalizes understanding and agreement.

## 2023-02-04 NOTE — Group Note (Signed)
Group Topic: Recovery Basics  Group Date: 02/04/2023 Start Time: 1215 End Time: 1230 Facilitators: Ogle Hoeffner, Jacklynn Barnacle, RN; Jenean Lindau, RN  Department: [facility]  Number of Participants: 4  Group Focus: daily focus Treatment Modality:  Patient-Centered Therapy Interventions utilized were assignment and group exercise Purpose: increase insight, regain self-worth, and relapse prevention strategies  Name: KHADIM HAYEN Date of Birth: Sep 15, 1985  MR: [id number]    Level of Participation: withdrawn Quality of Participation: drowsy, isolative, and withdrawn Interactions with others: minimal, only with multiple prompts given Mood/Affect: bored and tired Triggers (if applicable): none identified Cognition: loose and not focused Progress: Minimal Response: patient dozed off several times throughout group, multiple prompts needed for engagement Plan: patient will be encouraged to actively participate in groups  Patients Problems:  Patient Active Problem List   Diagnosis Date Noted   Opioid use disorder, severe, dependence (HCC) 02/02/2023   On high dose antipsychotic drug therapy 02/02/2023

## 2023-02-04 NOTE — ED Provider Notes (Signed)
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   Psychomotor Activity  Psychomotor Activity: Psychomotor Activity: Normal   Assets  Assets: Desire for Improvement; Resilience; Communication Skills   Sleep  Sleep: Sleep: Poor   No data recorded   Physical Exam  Physical Exam Vitals and nursing note reviewed.  Constitutional:      General: He is not in acute distress.    Appearance: He is ill-appearing.  HENT:     Head:  Normocephalic and atraumatic.  Pulmonary:     Effort: Pulmonary effort is normal. No respiratory distress.  Skin:    General: Skin is warm and dry.  Neurological:     General: No focal deficit present.     Mental Status: He is oriented to person, place, and time. He is lethargic.     Cranial Nerves: No cranial nerve deficit or facial asymmetry.     Sensory: No sensory deficit.     Motor: No weakness.     Coordination: Coordination normal.     Gait: Gait normal.    Review of Systems  Constitutional:  Positive for malaise/fatigue.  Respiratory:  Negative for shortness of breath.   Cardiovascular:  Negative for chest pain.  Gastrointestinal:  Positive for nausea and vomiting.  Musculoskeletal:  Positive for myalgias.   Blood pressure 108/71, pulse 62, temperature 97.7 F (36.5 C), temperature source Oral, resp. rate 18, SpO2 98%. There is no height or weight on file to calculate BMI.  Treatment Plan Summary: Johaan Blend is a 37 yo male with a PPHx of opioid use disorder, stimulant use disorder, and tobacco use disorder, admitted to [facility] on 10/17 for detox from recent fentanyl and cocaine use with interest in Residential Rehabilitation.  Patient continues to report  ongoing dizziness.  In reviewing medications, may be due to Seroquel, and exacerbated by clonidine, as patient has been with softer blood pressures this admission.  We will discontinue Seroquel and switch to prazosin.  Will increase the as needed trazodone in case more sedation is required upon discontinuation of the former.  Will discontinue as needed propranolol    Medical Decision Making  Psychiatric Diagnoses: Substance induced mood disorder Opioid Use Disorder Stimulant Use Disorder Tobacco use disorder Alcohol use disorder     Psychiatric Diagnoses and Treatment:  Opioid Use Disorder COWS scoring 4 at 6:16 AM on 10/19 Clonidine Taper Subutex induction schedule, 2 mg BID dosing, starting tomorrow as pt tolerated  medication well PRNs Tylenol 650 mg every 6 hours as needed for mild pain Naproxen 500 mg BID as needed for pain Bentyl 20 mg every 6 hours as needed for spasms/abdominal cramping Robaxin 500 mg every 8 hours as needed for muscle spasms Zofran 4 mg every 6 hours as needed for nausea or vomiting Imodium 2 to 4 mg as needed for diarrhea or loose stools Maalox/Mylanta 30 mL every 4 hours as needed for indigestion Milk of Mag 30 mL as needed for constipation    Alcohol Use Disorder CIWA 6 at 6:16 AM on 10/19, showing mild withdrawal   Substance induced mood disorder vs MDD  Continue home Zoloft 50 mg daily Discontinue home Seroquel 100 mg XR nightly due to dizziness A1C 5.5% Lipid WNL Trazodone 50 mg at bedtime and x 1 PRN for insomnia   Tobacco Use Disorder Encouraged smoking cessation Nicotine patch 21 mg daily Nicorette gum PRN   Medical Issues Being Addressed:   02/03/2023:  Unwitnessed fall reported by patient on 10/18 in the afternoon. Neurological exam is unremarkable, no hematoma or redness in the occipital region of head. Do not believe patient required CT imaging at this time.   #Subclinical Hyperthyroidism TSH (0.333 mIU/L) on 02/03/2023 Free T3 pending  Free T4 0.92 TSI pending    Other Labs/Imaging Reviewed: Hepatitis panel 10.3, showing immunity HIV - nonreactive CMP WNL CBC unremarkable   EKG on 02/02/2023: QTc 374     Disposition: Residential Rehabilitation, patient to call [facility], [facility], [facility], [facility], and [facility] to complete phone screening when able.    Signed: Lamar Sprinkles, MD 02/04/2023 8:29 AM

## 2023-02-04 NOTE — ED Notes (Signed)
Patient is sleeping. Respirations equal and unlabored, skin warm and dry. No change in assessment or acuity. Routine safety checks conducted according to facility protocol. Will continue to monitor for safety.   

## 2023-02-04 NOTE — ED Notes (Signed)
Pt reported pain of 7/10 in neck, PRN Tylenol given

## 2023-02-04 NOTE — ED Notes (Signed)
Pt is in the dayroom watching TV with peers. Pt denies SI/HI/AVH. No acute distress noted. Will continue to monitor for safety. 

## 2023-02-04 NOTE — ED Notes (Signed)
Patient reported nausea but no episodes of vomiting. PRN Zofran given, no further complaints at this time.

## 2023-02-04 NOTE — ED Notes (Signed)
Patient is sleeping. Respirations equal and unlabored, skin warm and dry, NAD. No change in assessment or acuity. Routine safety checks conducted according to facility protocol. Will continue to monitor for safety.   

## 2023-02-05 DIAGNOSIS — F1010 Alcohol abuse, uncomplicated: Secondary | ICD-10-CM | POA: Diagnosis not present

## 2023-02-05 DIAGNOSIS — F17200 Nicotine dependence, unspecified, uncomplicated: Secondary | ICD-10-CM | POA: Diagnosis not present

## 2023-02-05 DIAGNOSIS — Z79899 Other long term (current) drug therapy: Secondary | ICD-10-CM | POA: Diagnosis not present

## 2023-02-05 DIAGNOSIS — F1193 Opioid use, unspecified with withdrawal: Secondary | ICD-10-CM | POA: Diagnosis not present

## 2023-02-05 NOTE — ED Notes (Signed)
Patient remains asleep in bed at this time without issue or complaint.  Will monitor.

## 2023-02-05 NOTE — Group Note (Signed)
Group Topic: Recovery Basics  Group Date: 02/05/2023 Start Time: 1200 End Time: 1220 Facilitators: Jenean Lindau, RN  Department: [facility]  Number of Participants: 4  Group Focus: chemical dependency education Treatment Modality:  Cognitive Behavioral Therapy Interventions utilized were assignment and patient education Purpose: enhance coping skills, explore maladaptive thinking, express feelings, express irrational fears, improve communication skills, increase insight, and relapse prevention strategies  Name: Eric Lowery Date of Birth: 11-Jun-1985  MR: [id number]    Level of Participation: active Quality of Participation: attentive Interactions with others: gave feedback Mood/Affect: appropriate Triggers (if applicable):  Cognition: coherent/clear Progress: Gaining insight Response: positive  Plan: follow-up needed  Patients Problems:  Patient Active Problem List   Diagnosis Date Noted   Opioid use disorder, severe, dependence (HCC) 02/02/2023   On high dose antipsychotic drug therapy 02/02/2023

## 2023-02-05 NOTE — Group Note (Signed)
Group Topic: Communication  Group Date: 02/05/2023 Start Time: 0700 End Time: 0701 Facilitators: Alvie Heidelberg, NT  Department: [facility]  Number of Participants: 0, nobody attended group they wanted to watch football game. Group Focus: N/A Treatment Modality: N/A Interventions utilized were N/A Purpose: N/A  Name: NITHILAN FREEMON Date of Birth: [birth year]  MR: [id number]    Level of Participation: 0 Quality of Participation: Did not attend Interactions with others: n/a Mood/Affect: n/a Triggers (if applicable): n/a Cognition: n/a Progress: n/a Response: plan to attend group in the future. Plan: Encourage the importance of having group.  Patients Problems:  Patient Active Problem List   Diagnosis Date Noted   Opioid use disorder, severe, dependence (HCC) 02/02/2023   On high dose antipsychotic drug therapy 02/02/2023

## 2023-02-05 NOTE — ED Notes (Signed)
Patient has been awake and alert on unit.  He ate lunch and is calm and pleasant.  Patient continues to ambulate tentatively and when asked about it he reports that the soles of his feet hurt.  Will explore if patient has shoes in locker he can wear.  Patient is motivated for treatment and has been asking questions related to recovery.  His knowledge is limited and Clinical research associate has been discussing the basics of recovery with him 1:1.  Patient shown literature and given a copy of the [facility] prayer which was discussed in group.  He is open to learning.  Overall patient is improved and withdrawal today is minimal.  Will monitor and provide a safe environment.

## 2023-02-05 NOTE — ED Provider Notes (Signed)
Behavioral Health Progress Note  Date and Time: 02/05/2023 6:48 PM Name: Eric Lowery MRN:  [id number]  Eric Lowery is a 37 yo male with a PPHx of opioid use disorder, stimulant use disorder, and tobacco use disorder, admitted to [facility] on 10/17 for detox from recent fentanyl and cocaine use with interest in Residential Rehabilitation. He reports a hx of IV drug use. UDS on admission is positive for buprenorphine, benzodiazepines, and cocaine.   Subjective:  The patient was evaluated on the unit and reports that he is in a much better mood today. He was unable to reach any of the facilities to whom he made calls yesterday, but spent a lot of time reading the informational packets about them, which increased his anxiety. He was reassured to try again tomorrow, and this provided some relief. He slept well last night and has noted improvements to his appetite. He denies dizziness at rest today, just when walking; although he does report some lightheadedness.  He tolerated the discontinuation of Seroquel and initiation of Prazosin well with the first dosage. Of note, his blood pressure this AM was hypotensive at 0604 AM, but normotensive when recheked at 0643. Will keep an eye out on his BP with prazosin and Trazodone. He has not yet had a BM and endorses some nausea but denies all other withdrawal sx today. He denies suicidal ideation, self-harm thoughts, homicidal ideation, auditory or visual hallucinations, paranoia, or delusions. He did have some passive SI yesterday while looking over residential materials and reflecting on his most recent overdose, but those thoughts have since subsided, and he is able to contract for safety.   Substance Use Hx: Fentanyl: IV use 3.5 grams 2 days ago. Uses it daily for the past 2.5 months.  Cocaine: Report using "hours" prior to admission to [facility] on 02/02/2023, intranasal. 2.5-3.0 grams daily for /the past 2 months (since wife separated) Tobacco: 2 ppd for the past 20  years Alcohol - Drinks moonshine nightly when he comes back from work, around 0.5 gallons a night.  Patient has participated in rehab with [facility] in [place] and in [place]. Pt recently enrolled in substance abuse treatment program with [facility] but left after 15 hours because "it was a faith based program and I don't agree with their philosophy."    Past Psychiatric Hx: Current Psychiatrist: Dr. April Manson, in [place] Previous Psychiatric Diagnoses: pt is unable to specify any formal previous psychiatric diagnoses Psychiatric medication history/compliance: reports compliance   Past Medical History: Allergies: penicillins, shellfish, peanuts Seizures: reports previous hx of epilepsy, has not had a seizure in "years", reports previously being prescribed Keppra.    Social History: Marital Status: Married, separated from wife Children: 3 children Legal: Upcoming court date in December to get his license 2/2 texting and driving. No past hx of incarcerations or legal charges.  Military: No   Diagnosis:  Final diagnoses:  Tobacco use disorder  On high dose antipsychotic drug therapy  Opioid use with withdrawal (HCC)  Alcohol abuse    Total Time spent with patient: 45 minutes   Current Medications:  Current Facility-Administered Medications  Medication Dose Route Frequency Provider Last Rate Last Admin   acetaminophen (TYLENOL) tablet 650 mg  650 mg Oral Q6H PRN Meryl Dare, MD   650 mg at 02/04/23 0915   alum & mag hydroxide-simeth (MAALOX/MYLANTA) 200-200-20 MG/5ML suspension 30 mL  30 mL Oral Q4H PRN Meryl Dare, MD   30 mL at 02/02/23 1916   buprenorphine (SUBUTEX)  SL tablet 2 mg  2 mg Sublingual BID Lamar Sprinkles, MD   2 mg at 02/05/23 0935   dicyclomine (BENTYL) tablet 20 mg  20 mg Oral Q6H PRN Meryl Dare, MD   20 mg at 02/03/23 2138   hydrOXYzine (ATARAX) tablet 25 mg  25 mg Oral Q6H PRN Meryl Dare, MD   25 mg at 02/05/23 1410   loperamide  (IMODIUM) capsule 2-4 mg  2-4 mg Oral PRN Meryl Dare, MD       magnesium hydroxide (MILK OF MAGNESIA) suspension 30 mL  30 mL Oral Daily PRN Meryl Dare, MD   30 mL at 02/05/23 1045   methocarbamol (ROBAXIN) tablet 500 mg  500 mg Oral Q8H PRN Meryl Dare, MD   500 mg at 02/03/23 2138   naproxen (NAPROSYN) tablet 500 mg  500 mg Oral BID PRN Meryl Dare, MD   500 mg at 02/03/23 0834   nicotine (NICODERM CQ - dosed in mg/24 hours) patch 21 mg  21 mg Transdermal Daily Meryl Dare, MD   21 mg at 02/05/23 1308   nicotine polacrilex (NICORETTE) gum 4 mg  4 mg Oral PRN Meryl Dare, MD   4 mg at 02/05/23 1806   ondansetron (ZOFRAN-ODT) disintegrating tablet 4 mg  4 mg Oral Q6H PRN Meryl Dare, MD   4 mg at 02/05/23 1531   prazosin (MINIPRESS) capsule 1 mg  1 mg Oral QHS Lamar Sprinkles, MD   1 mg at 02/04/23 2102   sertraline (ZOLOFT) tablet 50 mg  50 mg Oral Daily Carrion-Carrero, Margely, MD   50 mg at 02/05/23 0935   traZODone (DESYREL) tablet 50 mg  50 mg Oral QHS,MR X 1 Lamar Sprinkles, MD   50 mg at 02/04/23 2208   Current Outpatient Medications  Medication Sig Dispense Refill   albuterol (VENTOLIN HFA) 108 (90 Base) MCG/ACT inhaler Inhale 1-2 puffs into the lungs every 6 (six) hours as needed for wheezing or shortness of breath.     QUEtiapine (SEROQUEL) 100 MG tablet Take 100 mg by mouth at bedtime.     sertraline (ZOLOFT) 50 MG tablet Take 50 mg by mouth daily.      Labs  Lab Results:  Admission on 02/02/2023  Component Date Value Ref Range Status   Hgb A1c MFr Bld 02/03/2023 5.5  4.8 - 5.6 % Final   Comment: (NOTE) Pre diabetes:          5.7%-6.4%  Diabetes:              >6.4%  Glycemic control for   <7.0% adults with diabetes    Mean Plasma Glucose 02/03/2023 111.15  mg/dL Final   Performed at [facility], [address] [place], [place] [address]   Cholesterol 02/03/2023 157  0 - 200 mg/dL Final   Triglycerides 69/62/9528 75  <150 mg/dL Final   HDL  41/32/4401 45  >40 mg/dL Final   Total CHOL/HDL Ratio 02/03/2023 3.5  RATIO Final   VLDL 02/03/2023 15  0 - 40 mg/dL Final   LDL Cholesterol 02/03/2023 97  0 - 99 mg/dL Final   Comment:        Total Cholesterol/HDL:CHD Risk Coronary Heart Disease Risk Table                     Men   Women  1/2 Average Risk   3.4   3.3  Average Risk       5.0   4.4  2 X Average Risk   9.6   7.1  3 X Average Risk  23.4   11.0        Use the calculated Patient Ratio above and the CHD Risk Table to determine the patient's CHD Risk.        ATP III CLASSIFICATION (LDL):  <100     mg/dL   Optimal  295-284  mg/dL   Near or Above                    Optimal  130-159  mg/dL   Borderline  132-440  mg/dL   High  >102     mg/dL   Very High Performed at [facility], [address], [place], [place] [address]    TSH 02/03/2023 0.333 (L)  0.350 - 4.500 uIU/mL Final   Comment: Performed by a 3rd Generation assay with a functional sensitivity of <=0.01 uIU/mL. Performed at [facility], [address] [place], [place] [address]    Free T4 02/04/2023 0.92  0.61 - 1.12 ng/dL Final   Comment: (NOTE) Biotin ingestion may interfere with free T4 tests. If the results are inconsistent with the TSH level, previous test results, or the clinical presentation, then consider biotin interference. If needed, order repeat testing after stopping biotin. Performed at [facility], [address] [place], [place] [address]   Admission on 02/02/2023, Discharged on 02/02/2023  Component Date Value Ref Range Status   HIV Screen 4th Generation wRfx 02/02/2023 Non Reactive  Non Reactive Final   Performed at [facility], [address], [place], [place] [address]   POC Amphetamine UR 02/02/2023 None Detected  NONE DETECTED (Cut Off Level 1000 ng/mL) Final   POC Secobarbital (BAR) 02/02/2023 None Detected  NONE DETECTED (Cut Off Level 300 ng/mL) Final   POC Buprenorphine (BUP) 02/02/2023 Positive (A)   NONE DETECTED (Cut Off Level 10 ng/mL) Final   POC Oxazepam (BZO) 02/02/2023 Positive (A)  NONE DETECTED (Cut Off Level 300 ng/mL) Final   POC Cocaine UR 02/02/2023 Positive (A)  NONE DETECTED (Cut Off Level 300 ng/mL) Final   POC Methamphetamine UR 02/02/2023 None Detected  NONE DETECTED (Cut Off Level 1000 ng/mL) Final   POC Morphine 02/02/2023 None Detected  NONE DETECTED (Cut Off Level 300 ng/mL) Final   POC Methadone UR 02/02/2023 None Detected  NONE DETECTED (Cut Off Level 300 ng/mL) Final   POC Oxycodone UR 02/02/2023 None Detected  NONE DETECTED (Cut Off Level 100 ng/mL) Final   POC Marijuana UR 02/02/2023 None Detected  NONE DETECTED (Cut Off Level 50 ng/mL) Final   TSH 02/02/2023 0.244 (L)  0.350 - 4.500 uIU/mL Final   Comment: Performed by a 3rd Generation assay with a functional sensitivity of <=0.01 uIU/mL. Performed at [facility], [address], [place], [place] [address]    Cholesterol 02/02/2023 177  0 - 200 mg/dL Final   Triglycerides 32/95/1884 88  <150 mg/dL Final   HDL 16/60/6301 58  >40 mg/dL Final   Total CHOL/HDL Ratio 02/02/2023 3.1  RATIO Final   VLDL 02/02/2023 18  0 - 40 mg/dL Final   LDL Cholesterol 02/02/2023 101 (H)  0 - 99 mg/dL Final   Comment:        Total Cholesterol/HDL:CHD Risk Coronary Heart Disease Risk Table                     Men  Women  1/2 Average Risk   3.4   3.3  Average Risk       5.0   4.4  2 X Average Risk   9.6   7.1  3 X Average Risk  23.4   11.0        Use the calculated Patient Ratio above and the CHD Risk Table to determine the patient's CHD Risk.        ATP III CLASSIFICATION (LDL):  <100     mg/dL   Optimal  409-811  mg/dL   Near or Above                    Optimal  130-159  mg/dL   Borderline  914-782  mg/dL   High  >956     mg/dL   Very High Performed at [facility], [address], [place], [place] [address]    HCV Quantitative 02/02/2023 HCV Not Detected  >50 IU/mL Final   Test Information 02/02/2023  Comment   Final   Comment: (NOTE) The quantitative range of this assay is 15 IU/mL to 100 million IU/mL. Performed At: [facility] [address] [place], [place] [id number] Jolene Schimke MD [id number]    Hep B S AB Quant (Post) 02/02/2023 10.3  Immunity>10 mIU/mL Final   Comment: (NOTE)  Status of Immunity                     Anti-HBs Level  ------------------                     -------------- Inconsistent with Immunity                  0.0 - 10.0 Consistent with Immunity                         >10.0 Performed At: [facility] [address] [place], [place] [id number] Jolene Schimke MD QI:[id number]   Admission on 01/31/2023, Discharged on 01/31/2023  Component Date Value Ref Range Status   WBC 01/31/2023 6.4  4.0 - 10.5 K/uL Final   RBC 01/31/2023 4.43  4.22 - 5.81 MIL/uL Final   Hemoglobin 01/31/2023 13.1  13.0 - 17.0 g/dL Final   HCT 87/56/4332 38.7 (L)  39.0 - 52.0 % Final   MCV 01/31/2023 87.4  80.0 - 100.0 fL Final   MCH 01/31/2023 29.6  26.0 - 34.0 pg Final   MCHC 01/31/2023 33.9  30.0 - 36.0 g/dL Final   RDW 95/18/8416 12.4  11.5 - 15.5 % Final   Platelets 01/31/2023 311  150 - 400 K/uL Final   nRBC 01/31/2023 0.0  0.0 - 0.2 % Final   Neutrophils Relative % 01/31/2023 55  % Final   Neutro Abs 01/31/2023 3.5  1.7 - 7.7 K/uL Final   Lymphocytes Relative 01/31/2023 30  % Final   Lymphs Abs 01/31/2023 1.9  0.7 - 4.0 K/uL Final   Monocytes Relative 01/31/2023 12  % Final   Monocytes Absolute 01/31/2023 0.8  0.1 - 1.0 K/uL Final   Eosinophils Relative 01/31/2023 3  % Final   Eosinophils Absolute 01/31/2023 0.2  0.0 - 0.5 K/uL Final   Basophils Relative 01/31/2023 0  % Final   Basophils Absolute 01/31/2023 0.0  0.0 - 0.1 K/uL Final   Immature Granulocytes 01/31/2023 0  % Final   Abs Immature Granulocytes 01/31/2023 0.01  0.00 - 0.07 K/uL Final  Performed at [facility], [address], [place], [place] [address]   Sodium 01/31/2023 136  135 - 145  mmol/L Final   Potassium 01/31/2023 3.9  3.5 - 5.1 mmol/L Final   Chloride 01/31/2023 100  98 - 111 mmol/L Final   CO2 01/31/2023 28  22 - 32 mmol/L Final   Glucose, Bld 01/31/2023 83  70 - 99 mg/dL Final   Glucose reference range applies only to samples taken after fasting for at least 8 hours.   BUN 01/31/2023 13  6 - 20 mg/dL Final   Creatinine, Ser 01/31/2023 0.80  0.61 - 1.24 mg/dL Final   Calcium 86/57/8469 8.8 (L)  8.9 - 10.3 mg/dL Final   Total Protein 62/95/2841 6.6  6.5 - 8.1 g/dL Final   Albumin 32/44/0102 3.7  3.5 - 5.0 g/dL Final   AST 72/53/6644 19  15 - 41 U/L Final   ALT 01/31/2023 15  0 - 44 U/L Final   Alkaline Phosphatase 01/31/2023 75  38 - 126 U/L Final   Total Bilirubin 01/31/2023 0.5  0.3 - 1.2 mg/dL Final   GFR, Estimated 01/31/2023 >60  >60 mL/min Final   Comment: (NOTE) Calculated using the CKD-EPI Creatinine Equation (2021)    Anion gap 01/31/2023 8  5 - 15 Final   Performed at [facility], [address], [place], [place] [address]   Troponin I (High Sensitivity) 01/31/2023 8  <18 ng/L Final   Comment: (NOTE) Elevated high sensitivity troponin I (hsTnI) values and significant  changes across serial measurements may suggest ACS but many other  chronic and acute conditions are known to elevate hsTnI results.  Refer to the "Links" section for chest pain algorithms and additional  guidance. Performed at [facility], [address], [place], [place] [address]    Alcohol, Ethyl (B) 01/31/2023 <10  <10 mg/dL Final   Comment: (NOTE) Lowest detectable limit for serum alcohol is 10 mg/dL.  For medical purposes only. Performed at [facility], [address], [place], [place] [address]    Opiates 01/31/2023 NONE DETECTED  NONE DETECTED Final   Cocaine 01/31/2023 POSITIVE (A)  NONE DETECTED Final   Benzodiazepines 01/31/2023 POSITIVE (A)  NONE DETECTED Final   Amphetamines 01/31/2023 NONE DETECTED  NONE DETECTED Final   Tetrahydrocannabinol 01/31/2023 NONE  DETECTED  NONE DETECTED Final   Barbiturates 01/31/2023 NONE DETECTED  NONE DETECTED Final   Comment: (NOTE) DRUG SCREEN FOR MEDICAL PURPOSES ONLY.  IF CONFIRMATION IS NEEDED FOR ANY PURPOSE, NOTIFY LAB WITHIN 5 DAYS.  LOWEST DETECTABLE LIMITS FOR URINE DRUG SCREEN Drug Class                     Cutoff (ng/mL) Amphetamine and metabolites    1000 Barbiturate and metabolites    200 Benzodiazepine                 200 Opiates and metabolites        300 Cocaine and metabolites        300 THC                            50 Performed at [facility], [address], [place], [place] [address]    Troponin I (High Sensitivity) 01/31/2023 5  <18 ng/L Final   Comment: (NOTE) Elevated high sensitivity troponin I (hsTnI) values and significant  changes across serial measurements may suggest ACS but many other  chronic and acute  conditions are known to elevate hsTnI results.  Refer to the "Links" section for chest pain algorithms and additional  guidance. Performed at [facility], [address] [place], [place] [address]   Admission on 01/30/2023, Discharged on 01/31/2023  Component Date Value Ref Range Status   Glucose-Capillary 01/30/2023 103 (H)  70 - 99 mg/dL Final   Glucose reference range applies only to samples taken after fasting for at least 8 hours.   WBC 01/30/2023 12.2 (H)  4.0 - 10.5 K/uL Final   RBC 01/30/2023 4.56  4.22 - 5.81 MIL/uL Final   Hemoglobin 01/30/2023 13.3  13.0 - 17.0 g/dL Final   HCT 27/25/3664 39.6  39.0 - 52.0 % Final   MCV 01/30/2023 86.8  80.0 - 100.0 fL Final   MCH 01/30/2023 29.2  26.0 - 34.0 pg Final   MCHC 01/30/2023 33.6  30.0 - 36.0 g/dL Final   RDW 40/34/7425 12.3  11.5 - 15.5 % Final   Platelets 01/30/2023 344  150 - 400 K/uL Final   nRBC 01/30/2023 0.0  0.0 - 0.2 % Final   Neutrophils Relative % 01/30/2023 80  % Final   Neutro Abs 01/30/2023 9.7 (H)  1.7 - 7.7 K/uL Final   Lymphocytes Relative 01/30/2023 11  % Final   Lymphs Abs 01/30/2023 1.4   0.7 - 4.0 K/uL Final   Monocytes Relative 01/30/2023 8  % Final   Monocytes Absolute 01/30/2023 1.0  0.1 - 1.0 K/uL Final   Eosinophils Relative 01/30/2023 1  % Final   Eosinophils Absolute 01/30/2023 0.1  0.0 - 0.5 K/uL Final   Basophils Relative 01/30/2023 0  % Final   Basophils Absolute 01/30/2023 0.0  0.0 - 0.1 K/uL Final   Immature Granulocytes 01/30/2023 0  % Final   Abs Immature Granulocytes 01/30/2023 0.04  0.00 - 0.07 K/uL Final   Performed at [facility], [address], [place], [place] [address]   Sodium 01/30/2023 135  135 - 145 mmol/L Final   Potassium 01/30/2023 4.3  3.5 - 5.1 mmol/L Final   Chloride 01/30/2023 100  98 - 111 mmol/L Final   CO2 01/30/2023 27  22 - 32 mmol/L Final   Glucose, Bld 01/30/2023 100 (H)  70 - 99 mg/dL Final   Glucose reference range applies only to samples taken after fasting for at least 8 hours.   BUN 01/30/2023 16  6 - 20 mg/dL Final   Creatinine, Ser 01/30/2023 0.83  0.61 - 1.24 mg/dL Final   Calcium 75/64/3329 8.9  8.9 - 10.3 mg/dL Final   Total Protein 51/88/4166 7.0  6.5 - 8.1 g/dL Final   Albumin 10/16/1599 4.1  3.5 - 5.0 g/dL Final   AST 09/32/3557 21  15 - 41 U/L Final   ALT 01/30/2023 14  0 - 44 U/L Final   Alkaline Phosphatase 01/30/2023 79  38 - 126 U/L Final   Total Bilirubin 01/30/2023 0.4  0.3 - 1.2 mg/dL Final   GFR, Estimated 01/30/2023 >60  >60 mL/min Final   Comment: (NOTE) Calculated using the CKD-EPI Creatinine Equation (2021)    Anion gap 01/30/2023 8  5 - 15 Final   Performed at [facility], [address], [place], [place] [address]   Opiates 01/30/2023 NONE DETECTED  NONE DETECTED Final   Cocaine 01/30/2023 POSITIVE (A)  NONE DETECTED Final   Benzodiazepines 01/30/2023 POSITIVE (A)  NONE DETECTED Final   Amphetamines 01/30/2023 NONE DETECTED  NONE DETECTED Final   Tetrahydrocannabinol 01/30/2023 NONE DETECTED  NONE DETECTED Final   Barbiturates 01/30/2023 NONE DETECTED  NONE DETECTED Final   Comment: (NOTE) DRUG  SCREEN FOR MEDICAL PURPOSES ONLY.  IF CONFIRMATION IS NEEDED FOR ANY PURPOSE, NOTIFY LAB WITHIN 5 DAYS.  LOWEST DETECTABLE LIMITS FOR URINE DRUG SCREEN Drug Class                     Cutoff (ng/mL) Amphetamine and metabolites    1000 Barbiturate and metabolites    200 Benzodiazepine                 200 Opiates and metabolites        300 Cocaine and metabolites        300 THC                            50 Performed at [facility], [address], [place], [place] [address]    Troponin I (High Sensitivity) 01/30/2023 <2  <18 ng/L Final   Comment: (NOTE) Elevated high sensitivity troponin I (hsTnI) values and significant  changes across serial measurements may suggest ACS but many other  chronic and acute conditions are known to elevate hsTnI results.  Refer to the "Links" section for chest pain algorithms and additional  guidance. Performed at [facility], [address], [place], [place] [address]    Troponin I (High Sensitivity) 01/30/2023 <2  <18 ng/L Final   Comment: (NOTE) Elevated high sensitivity troponin I (hsTnI) values and significant  changes across serial measurements may suggest ACS but many other  chronic and acute conditions are known to elevate hsTnI results.  Refer to the "Links" section for chest pain algorithms and additional  guidance. Performed at [facility], [address] [place], [place] [address]   Admission on 01/05/2023, Discharged on 01/05/2023  Component Date Value Ref Range Status   Sodium 01/05/2023 138  135 - 145 mmol/L Final   Potassium 01/05/2023 4.2  3.5 - 5.1 mmol/L Final   Chloride 01/05/2023 101  98 - 111 mmol/L Final   CO2 01/05/2023 26  22 - 32 mmol/L Final   Glucose, Bld 01/05/2023 115 (H)  70 - 99 mg/dL Final   Glucose reference range applies only to samples taken after fasting for at least 8 hours.   BUN 01/05/2023 13  6 - 20 mg/dL Final   Creatinine, Ser 01/05/2023 0.91  0.61 - 1.24 mg/dL Final   Calcium 91/47/8295 10.0  8.9 -  10.3 mg/dL Final   Total Protein 62/13/0865 7.7  6.5 - 8.1 g/dL Final   Albumin 78/46/9629 4.5  3.5 - 5.0 g/dL Final   AST 52/84/1324 20  15 - 41 U/L Final   ALT 01/05/2023 16  0 - 44 U/L Final   Alkaline Phosphatase 01/05/2023 79  38 - 126 U/L Final   Total Bilirubin 01/05/2023 0.9  0.3 - 1.2 mg/dL Final   GFR, Estimated 01/05/2023 >60  >60 mL/min Final   Comment: (NOTE) Calculated using the CKD-EPI Creatinine Equation (2021)    Anion gap 01/05/2023 11  5 - 15 Final   Performed at [facility], [address] [place], [place] [address]   Alcohol, Ethyl (B) 01/05/2023 <10  <10 mg/dL Final   Comment: (NOTE) Lowest detectable limit for serum alcohol is 10 mg/dL.  For medical purposes only. Performed at [facility], [address], [place], [place] [address]    WBC 01/05/2023 8.7  4.0 - 10.5 K/uL Final  RBC 01/05/2023 5.23  4.22 - 5.81 MIL/uL Final   Hemoglobin 01/05/2023 15.4  13.0 - 17.0 g/dL Final   HCT 27/09/2374 46.2  39.0 - 52.0 % Final   MCV 01/05/2023 88.3  80.0 - 100.0 fL Final   MCH 01/05/2023 29.4  26.0 - 34.0 pg Final   MCHC 01/05/2023 33.3  30.0 - 36.0 g/dL Final   RDW 28/31/5176 12.7  11.5 - 15.5 % Final   Platelets 01/05/2023 334  150 - 400 K/uL Final   nRBC 01/05/2023 0.0  0.0 - 0.2 % Final   Performed at [facility], [address] [place], [place] [address]   Opiates 01/05/2023 NONE DETECTED  NONE DETECTED Final   Cocaine 01/05/2023 POSITIVE (A)  NONE DETECTED Final   Benzodiazepines 01/05/2023 NONE DETECTED  NONE DETECTED Final   Amphetamines 01/05/2023 POSITIVE (A)  NONE DETECTED Final   Tetrahydrocannabinol 01/05/2023 POSITIVE (A)  NONE DETECTED Final   Barbiturates 01/05/2023 NONE DETECTED  NONE DETECTED Final   Comment: (NOTE) DRUG SCREEN FOR MEDICAL PURPOSES ONLY.  IF CONFIRMATION IS NEEDED FOR ANY PURPOSE, NOTIFY LAB WITHIN 5 DAYS.  LOWEST DETECTABLE LIMITS FOR URINE DRUG SCREEN Drug Class                     Cutoff (ng/mL) Amphetamine and  metabolites    1000 Barbiturate and metabolites    200 Benzodiazepine                 200 Opiates and metabolites        300 Cocaine and metabolites        300 THC                            50 Performed at [facility], [address] [place], [place] [address]     Blood Alcohol level:  Lab Results  Component Value Date   [facility] <10 01/31/2023   ETH <10 01/05/2023    Metabolic Disorder Labs: Lab Results  Component Value Date   HGBA1C 5.5 02/03/2023   MPG 111.15 02/03/2023   No results found for: "PROLACTIN" Lab Results  Component Value Date   CHOL 157 02/03/2023   TRIG 75 02/03/2023   HDL 45 02/03/2023   CHOLHDL 3.5 02/03/2023   VLDL 15 02/03/2023   LDLCALC 97 02/03/2023   LDLCALC 101 (H) 02/02/2023    Therapeutic Lab Levels: No results found for: "LITHIUM" No results found for: "VALPROATE" No results found for: "CBMZ"  Physical Findings   AUDIT    Flowsheet Row ED from 02/02/2023 in [facility]  Alcohol Use Disorder Identification Test Final Score (AUDIT) 34      PHQ2-9    Flowsheet Row ED from 02/02/2023 in [facility] Office Visit from 01/04/2022 in [facility]  PHQ-2 Total Score 2 0  PHQ-9 Total Score 15 --      Flowsheet Row ED from 02/02/2023 in [facility] Most recent reading at 02/02/2023  2:13 PM ED from 02/02/2023 in [facility] Most recent reading at 02/02/2023 11:41 AM ED from 01/31/2023 in [facility] Emergency Department at [facility] Most recent reading at 01/31/2023  9:44 AM  C-SSRS RISK CATEGORY No Risk No Risk No Risk        Musculoskeletal  Strength & Muscle Tone: within normal limits Gait & Station: normal Patient leans:  N/A  Psychiatric Specialty Exam  Presentation  General Appearance:  Appropriate for Environment; Casual  Eye Contact: Good  Speech: Clear and Coherent;  Normal Rate  Speech Volume: Normal  Handedness: Right   Mood and Affect  Mood: Euthymic; Anxious  Affect: Appropriate; Congruent   Thought Process  Thought Processes: Coherent; Linear  Descriptions of Associations:Intact  Orientation:Full (Time, Place and Person)  Thought Content:Logical; WDL  Diagnosis of Schizophrenia or Schizoaffective disorder in past: No    Hallucinations:Hallucinations: None   Ideas of Reference:None  Suicidal Thoughts:Suicidal Thoughts: No   Homicidal Thoughts:Homicidal Thoughts: No    Sensorium  Memory: Immediate Good; Recent Good  Judgment: Fair  Insight: Fair   Art therapist  Concentration: Good  Attention Span: Good  Recall: Good  Fund of Knowledge: Good  Language: Good   Psychomotor Activity  Psychomotor Activity: Psychomotor Activity: Normal    Assets  Assets: Communication Skills; Desire for Improvement; Financial Resources/Insurance   Sleep  Sleep: Sleep: Good    No data recorded   Physical Exam  Physical Exam Vitals and nursing note reviewed.  Constitutional:      General: He is not in acute distress.    Appearance: He is not ill-appearing.  HENT:     Head: Normocephalic and atraumatic.  Pulmonary:     Effort: Pulmonary effort is normal. No respiratory distress.  Skin:    General: Skin is warm and dry.  Neurological:     General: No focal deficit present.     Mental Status: He is alert and oriented to person, place, and time.     Cranial Nerves: No cranial nerve deficit or facial asymmetry.     Sensory: No sensory deficit.     Motor: No weakness.     Coordination: Coordination normal.     Gait: Gait normal.    Review of Systems  Constitutional:  Negative for malaise/fatigue.  Respiratory:  Negative for shortness of breath.   Cardiovascular:  Negative for chest pain.  Gastrointestinal:  Positive for nausea. Negative for vomiting.  Musculoskeletal:  Negative for  myalgias.  Neurological:  Positive for dizziness.       Some lightheadedness and improved dizziness.    Blood pressure 120/60, pulse 64, temperature 98.1 F (36.7 C), temperature source Oral, resp. rate 18, SpO2 97%. There is no height or weight on file to calculate BMI.  Treatment Plan Summary: Eril Timpson is a 37 yo male with a PPHx of opioid use disorder, stimulant use disorder, and tobacco use disorder, admitted to [facility] on 10/17 for detox from recent fentanyl and cocaine use with interest in Residential Rehabilitation.  Patient reports improvements to dizziness today after discontinuing Seroquel last night.  As mentioned in HPI, will keep an eye out for hypotensive BPs with Prazosin and Trazodone. May need to adjust dose of the latter in the future, particularly if patient symptomatic. Will increase Zoloft for PTSD related anxiety symptoms; as well, he was reassured that some degree of anxiety and sadness are normal emotions.     Medical Decision Making  Psychiatric Diagnoses: Substance induced mood disorder PTSD Opioid Use Disorder Stimulant Use Disorder Tobacco use disorder Alcohol use disorder     Psychiatric Diagnoses and Treatment:  Opioid Use Disorder COWS scoring 0 yesterday. Will discontinue monitoring.  Clonidine Taper Subutex induction schedule, 2 mg BID dosing, starting today PRNs Tylenol 650 mg every 6 hours as needed for mild pain Naproxen 500 mg BID as needed for pain Bentyl 20 mg every 6 hours  as needed for spasms/abdominal cramping Robaxin 500 mg every 8 hours as needed for muscle spasms Zofran 4 mg every 6 hours as needed for nausea or vomiting Imodium 2 to 4 mg as needed for diarrhea or loose stools Maalox/Mylanta 30 mL every 4 hours as needed for indigestion Milk of Mag 30 mL as needed for constipation    Alcohol Use Disorder CIWA 3 yesterday, and no withdrawal symptoms apparent today. Will d/c CIWA monitoring.  Substance induced mood disorder   PTSD Increase to Zoloft 100 mg daily, beginning tomorrow Continue Prazosin 1 mg at bedtime, started 10/19. Previously discontinued home Seroquel 100 mg XR nightly due to dizziness A1C 5.5% Lipid WNL Continue Trazodone 50 mg at bedtime and x 1 PRN for insomnia   Tobacco Use Disorder Encouraged smoking cessation Nicotine patch 21 mg daily Nicorette gum PRN   Medical Issues Being Addressed:   02/03/2023:  Unwitnessed fall reported by patient on 10/18 in the afternoon. Neurological exam is unremarkable, no hematoma or redness in the occipital region of head. Do not believe patient required CT imaging at this time.   #Subclinical Hyperthyroidism; likely substance-related TSH (0.333 mIU/L) on 02/03/2023 Free T3 pending  Free T4 0.92 TSI pending    Other Labs/Imaging Reviewed: Hepatitis panel 10.3, showing immunity HIV - nonreactive CMP WNL CBC unremarkable   EKG on 02/02/2023: QTc 374     Disposition: Residential Rehabilitation, patient to continue calling [facility], [facility], [address], and [facility] to complete phone screening.   Signed: Lamar Sprinkles, MD PGY-3 02/05/2023 6:48 PM

## 2023-02-05 NOTE — ED Notes (Signed)
Patient is in the Dayroom watching TV with other patients. Asked for snacks, chips given. Denies SI/HI/ &AVH. Concerns about the swollen feet other than that interactive and pleasant.  Respirations even and unlabored. Will continue to monitor for safety.

## 2023-02-05 NOTE — ED Notes (Signed)
Patient is resting comfortably. 

## 2023-02-05 NOTE — Group Note (Signed)
Group Topic: Positive Affirmations  Group Date: 02/05/2023 Start Time: 1245 End Time: 1308 Facilitators: Vonzell Schlatter B  Department: [facility]  Number of Participants: 4  Group Focus: affirmation and communication Treatment Modality:  Psychoeducation Interventions utilized were problem solving and story telling Purpose: increase insight and relapse prevention strategies  Name: DAELIN MAIRE Date of Birth: 07-Aug-1985  MR: [id number]    Level of Participation: active Quality of Participation: attentive and cooperative Interactions with others: gave feedback Mood/Affect: positive Triggers (if applicable): n/a Cognition: coherent/clear Progress: Moderate Response: pt is wanting a mentor/ therapist Plan: follow-up needed  Patients Problems:  Patient Active Problem List   Diagnosis Date Noted   Opioid use disorder, severe, dependence (HCC) 02/02/2023   On high dose antipsychotic drug therapy 02/02/2023

## 2023-02-05 NOTE — ED Notes (Signed)
Patient complained of all over body tremors from anxiety.  25mg  vistaril PO PRN given.  He accepted verbal support and education on progressive body relaxation.

## 2023-02-05 NOTE — ED Notes (Signed)
Patient is sleeping. Respirations equal and unlabored, skin warm and dry, NAD. No change in assessment or acuity. Routine safety checks conducted according to facility protocol. Will continue to monitor for safety.   

## 2023-02-05 NOTE — Group Note (Signed)
Group Topic: Communication  Group Date: 02/05/2023 Start Time: 1930 End Time: 2030 Facilitators: Rae Lips B  Department: [facility]  Number of Participants: 4  Group Focus: abuse issues, acceptance, activities of daily living skills, check in, communication, and depression Treatment Modality:  Individual Therapy, Patient-Centered Therapy, and Spiritual Interventions utilized were leisure development, problem solving, reality testing, story telling, and support Purpose: enhance coping skills, express feelings, regain self-worth, and relapse prevention strategies  Name: Eric Lowery Date of Birth: 22-Nov-1985  MR: [id number]    Level of Participation: active Quality of Participation: attentive, cooperative, and initiates communication Interactions with others: gave feedback Mood/Affect: appropriate and positive Triggers (if applicable): NA Cognition: coherent/clear Progress: Significant Response: He said being here is helping him see he needs help and he's ready.  Plan: patient will be encouraged to keep going to groups.   Patients Problems:  Patient Active Problem List   Diagnosis Date Noted   Opioid use disorder, severe, dependence (HCC) 02/02/2023   On high dose antipsychotic drug therapy 02/02/2023

## 2023-02-05 NOTE — ED Notes (Signed)
Patient asked for pain med for his foot. Medications given.

## 2023-02-06 ENCOUNTER — Emergency Department (HOSPITAL_COMMUNITY): Payer: Commercial Managed Care - PPO

## 2023-02-06 ENCOUNTER — Emergency Department (HOSPITAL_COMMUNITY)
Admission: EM | Admit: 2023-02-06 | Discharge: 2023-02-07 | Disposition: A | Discharge status: Other Acute Inpatient Hospital | Payer: Commercial Managed Care - PPO | Attending: Emergency Medicine | Admitting: Emergency Medicine

## 2023-02-06 ENCOUNTER — Encounter (HOSPITAL_COMMUNITY): Payer: Self-pay

## 2023-02-06 ENCOUNTER — Other Ambulatory Visit: Payer: Self-pay

## 2023-02-06 DIAGNOSIS — R531 Weakness: Secondary | ICD-10-CM | POA: Insufficient documentation

## 2023-02-06 DIAGNOSIS — M50222 Other cervical disc displacement at C5-C6 level: Secondary | ICD-10-CM | POA: Diagnosis not present

## 2023-02-06 DIAGNOSIS — S0990XA Unspecified injury of head, initial encounter: Secondary | ICD-10-CM | POA: Diagnosis not present

## 2023-02-06 DIAGNOSIS — J45909 Unspecified asthma, uncomplicated: Secondary | ICD-10-CM | POA: Diagnosis not present

## 2023-02-06 DIAGNOSIS — F17200 Nicotine dependence, unspecified, uncomplicated: Secondary | ICD-10-CM | POA: Diagnosis not present

## 2023-02-06 DIAGNOSIS — R0789 Other chest pain: Secondary | ICD-10-CM | POA: Diagnosis not present

## 2023-02-06 DIAGNOSIS — R079 Chest pain, unspecified: Secondary | ICD-10-CM | POA: Insufficient documentation

## 2023-02-06 DIAGNOSIS — R410 Disorientation, unspecified: Secondary | ICD-10-CM | POA: Diagnosis not present

## 2023-02-06 DIAGNOSIS — R42 Dizziness and giddiness: Secondary | ICD-10-CM | POA: Diagnosis not present

## 2023-02-06 DIAGNOSIS — F1193 Opioid use, unspecified with withdrawal: Secondary | ICD-10-CM | POA: Diagnosis not present

## 2023-02-06 DIAGNOSIS — I10 Essential (primary) hypertension: Secondary | ICD-10-CM | POA: Insufficient documentation

## 2023-02-06 DIAGNOSIS — Z91010 Allergy to peanuts: Secondary | ICD-10-CM | POA: Diagnosis not present

## 2023-02-06 DIAGNOSIS — Z79899 Other long term (current) drug therapy: Secondary | ICD-10-CM | POA: Diagnosis not present

## 2023-02-06 DIAGNOSIS — F1010 Alcohol abuse, uncomplicated: Secondary | ICD-10-CM | POA: Diagnosis not present

## 2023-02-06 DIAGNOSIS — M5030 Other cervical disc degeneration, unspecified cervical region: Secondary | ICD-10-CM | POA: Diagnosis not present

## 2023-02-06 DIAGNOSIS — S199XXA Unspecified injury of neck, initial encounter: Secondary | ICD-10-CM | POA: Diagnosis not present

## 2023-02-06 DIAGNOSIS — R29818 Other symptoms and signs involving the nervous system: Secondary | ICD-10-CM | POA: Diagnosis not present

## 2023-02-06 DIAGNOSIS — M50223 Other cervical disc displacement at C6-C7 level: Secondary | ICD-10-CM | POA: Diagnosis not present

## 2023-02-06 LAB — BASIC METABOLIC PANEL
Anion gap: 10 (ref 5–15)
BUN: 20 mg/dL (ref 6–20)
CO2: 27 mmol/L (ref 22–32)
Calcium: 9.4 mg/dL (ref 8.9–10.3)
Chloride: 98 mmol/L (ref 98–111)
Creatinine, Ser: 0.98 mg/dL (ref 0.61–1.24)
GFR, Estimated: >60 mL/min (ref >60)
Glucose, Bld: 81 mg/dL (ref 70–99)
Potassium: 4.4 mmol/L (ref 3.5–5.1)
Sodium: 135 mmol/L (ref 135–145)

## 2023-02-06 LAB — CBC
HCT: 39.2 % (ref 39.0–52.0)
Hemoglobin: 13.1 g/dL (ref 13.0–17.0)
MCH: 29.2 pg (ref 26.0–34.0)
MCHC: 33.4 g/dL (ref 30.0–36.0)
MCV: 87.3 fL (ref 80.0–100.0)
Platelets: 262 10*3/uL (ref 150–400)
RBC: 4.49 MIL/uL (ref 4.22–5.81)
RDW: 12 % (ref 11.5–15.5)
WBC: 6.4 10*3/uL (ref 4.0–10.5)
nRBC: 0 % (ref 0.0–0.2)

## 2023-02-06 LAB — RAPID URINE DRUG SCREEN, HOSP PERFORMED
Amphetamines: NOT DETECTED
Barbiturates: NOT DETECTED
Benzodiazepines: NOT DETECTED
Cocaine: NOT DETECTED
Opiates: NOT DETECTED
Tetrahydrocannabinol: NOT DETECTED

## 2023-02-06 LAB — TROPONIN I (HIGH SENSITIVITY): Troponin I (High Sensitivity): <2 ng/L (ref <18)

## 2023-02-06 MED ORDER — MORPHINE SULFATE (PF) 4 MG/ML IV SOLN
4.0000 mg | Freq: Once | INTRAVENOUS | Status: AC
Start: 2023-02-06 — End: 2023-02-06
  Administered 2023-02-06: 4 mg via INTRAVENOUS
  Filled 2023-02-06: qty 1

## 2023-02-06 MED ORDER — KETOROLAC TROMETHAMINE 15 MG/ML IJ SOLN
15.0000 mg | Freq: Once | INTRAMUSCULAR | Status: AC
  Administered 2023-02-06: 15 mg via INTRAVENOUS
  Filled 2023-02-06: qty 1

## 2023-02-06 MED ORDER — LORAZEPAM 2 MG/ML IJ SOLN
1.0000 mg | Freq: Once | INTRAMUSCULAR | Status: AC
  Administered 2023-02-06: 1 mg via INTRAVENOUS
  Filled 2023-02-06: qty 1

## 2023-02-06 MED ORDER — THIAMINE HCL 100 MG/ML IJ SOLN: 100.0000 mg | Freq: Once | INTRAMUSCULAR | Status: DC

## 2023-02-06 MED ORDER — THIAMINE HCL 100 MG PO TABS: 100.0000 mg | ORAL_TABLET | Freq: Every day | ORAL | Status: DC

## 2023-02-06 MED ORDER — GADOBUTROL 1 MMOL/ML IV SOLN
8.0000 mL | Freq: Once | INTRAVENOUS | Status: AC | PRN
  Administered 2023-02-06: 8 mL via INTRAVENOUS

## 2023-02-06 MED ORDER — LORAZEPAM 1 MG PO TABS
1.0000 mg | ORAL_TABLET | Freq: Once | ORAL | Status: AC
  Administered 2023-02-06: 1 mg via ORAL
  Filled 2023-02-06: qty 1

## 2023-02-06 MED ORDER — MORPHINE SULFATE (PF) 4 MG/ML IV SOLN
4.0000 mg | Freq: Once | INTRAVENOUS | Status: AC
  Administered 2023-02-06: 4 mg via INTRAVENOUS
  Filled 2023-02-06: qty 1

## 2023-02-06 NOTE — ED Notes (Signed)
Patient transported to [facility] via non emergent EMS.  Once patient is cleared he can return to [facility] to complete treatment.

## 2023-02-06 NOTE — ED Provider Notes (Signed)
Accepted handoff at shift change from Lurena Nida PA-C. Please see prior provider note for more detail.   Briefly: Patient is 37 y.o.   DDX: concern for patient coming in from [facility] for medical clearance.  Endorsing chest pain, dizziness for 4 days, history of seizures, reports "cannot walk", objective left arm and leg weakness on exam, reproducible on exam.  Profound dysmetria on exam.  Reports some subjective right side face abnormalities to sensation.  We spoke with neurology who recommend MRI brain and C-spine.  He does have a history of IV drug use.  No recent fever, chills.  Concern for epidural abscess versus osteomyelitis, versus discitis, additionally concern for endocarditis or other infectious etiology with his chest pain.  Likely to need admission for inability to walk regardless of results of MRI  Plan: I independently interpreted imaging including MR brain, C-spine which shows no evidence of acute intracranial abnormality or injury of cervical spine. I agree with the radiologist interpretation.  On reassessment patient's physical exam is very inconsistent, do not think it is representative of an acute neurologic process.  I spoke with Channing Mutters at behavioral health who agrees that patient can return to the behavior health urgent care.  He is medically cleared at this time.  If he continues to have weakness, dizziness he can follow-up with neurology once psychiatrically evaluated.     RISR  EDTHIS    Olene Floss, PA-C 02/06/23 2200    Tegeler, Canary Brim, MD 02/06/23 [phone]

## 2023-02-06 NOTE — ED Notes (Signed)
The patient walk to bathroom with assist and was very unsteady while walking. So patient was advised to use urinal the next time he have to use toilet, because we don't want him to fall, if he went on his own again. He stated that he understand. The nurse was informed. Seizure pads was placed on siderails, because patient stated he is going through detox.

## 2023-02-06 NOTE — ED Notes (Signed)
Pt went to MRI.

## 2023-02-06 NOTE — ED Provider Notes (Signed)
Name: Eric Lowery MRN:  [id number]  Eric Lowery is a 37 yo male with a PPHx of opioid use disorder, stimulant use disorder, and tobacco use disorder, admitted to [facility] on 10/17 for detox from recent fentanyl and cocaine use with interest in Residential Rehabilitation. He reports a hx of IV drug use. UDS on admission is positive for buprenorphine, benzodiazepines, and cocaine.   Subjective: Patient presenting this morning with a recent onset of worsening confusion, difficulty ambulating, and disorganized thinking. He had initially reported an unwitnessed fall on Friday without head trauma and normal neurological exam.   RN reports increasing difficulty with orientation and performing daily tasks overnight, he is having significant difficulty ambulating, requiring a roaming walker.   On assessment with patient, he reports worsening insomnia overnight. The patient is perseverative about another individual on the unit whom he claims is "testing his patience," repeatedly returning to this topic despite redirection. He is reporting racing thoughts and increased goal directed activity since his seroquel was discontinued over the weekend.   Contacted [facility] provider, spoke with Dr. Earlene Plater, who agrees to accept patient.   Objective: Vitals:   02/06/23 0643 02/06/23 0858  BP: 111/60 111/60  Pulse: 69 69  Resp:  16  Temp:  97.9 F (36.6 C)  SpO2:  96%  General: Appears confused and anxious. Neurological Exam:  The patient is oriented to the year but hesitant when asked to identify the month. He is unable to state the day of the week and does not respond to questions regarding his current location.  Speech is disorganized.  Gait is unsteady, with difficulty initiating movement. Cranial nerves II-XII grossly intact.  Assessment/Plan: 37 yo male with worsening confusion, disorganized thinking, and difficulty ambulating concerning for acute encephalopathy, requiring higher level of care. Patient had  initially reported dizziness on 10/19, initially believed to be medication induced, seroquel was discontinued at that time.  Once medically stabilized, patient can return to [facility].   Current Scheduled Medications include: Opioid Use Disorder Clonidine Taper Subutex induction schedule, 2 mg BID dosing  Alcohol Use Disorder Thiamine tablet 100 mg daily Thiamine 100 mg IM injection once  Substance induced mood disorder  PTSD Zoloft 100 mg daily Prazosin 1 mg at bedtime, started 10/19 Trazodone 50 mg at bedtime,  x1 PRN for insomnia  Tobacco Use Disorder Encouraged smoking cessation Nicotine patch 21 mg daily Nicorette gum PRN    Dr. Liston Alba, MD PGY-2, Psychiatry Residency

## 2023-02-06 NOTE — ED Notes (Signed)
Patients belongings sent with him.

## 2023-02-06 NOTE — ED Notes (Signed)
Patient remains unsteady in his gait even with walker.  RN and MHT both caught patient several times as he was about to fall from standing.  Patient strongly encouraged to remain seated and to call staff to have needs met.  MD met with patient and he has been accepted to [facility] for evaluation and medical clearance.  Non emergent ems called and awaiting their arrival.  Patient mood is upbeat and he remains pleasant and cooperative.  Will monitor and maintain safety until transfer.

## 2023-02-06 NOTE — Discharge Instructions (Addendum)
We do not see any medical reason for your numbness, weakness today, you can return to the behavioral health urgent care for further assessment and management.

## 2023-02-06 NOTE — ED Notes (Signed)
Patient is awake and alert on unit.  He continues to have difficulty ambulating and he was given a walker on night shift.  As per report patient awoke early this morning and was confused and disoriented.  Patient is less confused at this time.  He has simple thought process and information needs to be relayed simply and directly.  Will monitor patient and maintain safety while on unit.

## 2023-02-06 NOTE — ED Triage Notes (Signed)
Pt coming in with chest pain and dizzyness x4 days. Pt has a history of seizures. Pt was given 324 of baby Asprin.   BP110/68 Hr88 97% ra 122 cbg

## 2023-02-06 NOTE — ED Provider Notes (Signed)
[facility] EMERGENCY DEPARTMENT AT [facility] Provider Note   CSN: [id number] Arrival date & time: 01/31/23  3244     History  Chief Complaint  Patient presents with   Drug Overdose    Eric Lowery is a 37 y.o. male.  Patient went to [facility] and became pale and weak.  He was sent to the emergency department for possible syncope..  Patient was given Narcan at [facility].  Patient admits to using some drugs.  Patient mildly lethargic when seen  The history is provided by the patient.  Drug Overdose This is a new problem. The current episode started 6 to 12 hours ago. The problem occurs rarely. The problem has been resolved. Pertinent negatives include no chest pain, no abdominal pain and no headaches. Nothing aggravates the symptoms. Nothing relieves the symptoms. He has tried nothing for the symptoms. The treatment provided no relief.       Home Medications Prior to Admission medications   Medication Sig Start Date End Date Taking? Authorizing Provider  QUEtiapine (SEROQUEL) 100 MG tablet Take 100 mg by mouth at bedtime. 01/23/23  Yes [provider]  albuterol (VENTOLIN HFA) 108 (90 Base) MCG/ACT inhaler Inhale 1-2 puffs into the lungs every 6 (six) hours as needed for wheezing or shortness of breath.    [provider]  sertraline (ZOLOFT) 50 MG tablet Take 50 mg by mouth daily.    [provider]      Allergies    Peanuts [nuts], Penicillins, Shellfish allergy, and Cyclobenzaprine hcl    Review of Systems   Review of Systems  Constitutional:  Negative for appetite change and fatigue.  HENT:  Negative for congestion, ear discharge and sinus pressure.   Eyes:  Negative for discharge.  Respiratory:  Negative for cough.   Cardiovascular:  Negative for chest pain.  Gastrointestinal:  Negative for abdominal pain and diarrhea.  Genitourinary:  Negative for frequency and hematuria.  Musculoskeletal:  Negative for back pain.  Skin:   Negative for rash.  Neurological:  Negative for seizures and headaches.  Psychiatric/Behavioral:  Negative for hallucinations.     Physical Exam Updated Vital Signs BP 122/86   Pulse 75   Temp 98.2 F (36.8 C) (Oral)   Resp 15   Ht 6\' 1"  (1.854 m)   Wt 81.6 kg   SpO2 100%   BMI 23.75 kg/m  Physical Exam Vitals and nursing note reviewed.  Constitutional:      Appearance: He is well-developed.  HENT:     Head: Normocephalic.     Nose: Nose normal.  Eyes:     General: No scleral icterus.    Conjunctiva/sclera: Conjunctivae normal.  Neck:     Thyroid: No thyromegaly.  Cardiovascular:     Rate and Rhythm: Normal rate and regular rhythm.     Heart sounds: No murmur heard.    No friction rub. No gallop.  Pulmonary:     Breath sounds: No stridor. No wheezing or rales.  Chest:     Chest wall: No tenderness.  Abdominal:     General: There is no distension.     Tenderness: There is no abdominal tenderness. There is no rebound.  Musculoskeletal:        General: Normal range of motion.     Cervical back: Neck supple.  Lymphadenopathy:     Cervical: No cervical adenopathy.  Skin:    Findings: No erythema or rash.  Neurological:     Mental Status: He  is alert and oriented to person, place, and time.     Motor: No abnormal muscle tone.     Coordination: Coordination normal.     Comments: Mildly lethargic  Psychiatric:        Behavior: Behavior normal.     ED Results / Procedures / Treatments   Labs (all labs ordered are listed, but only abnormal results are displayed) Labs Reviewed  CBC WITH DIFFERENTIAL/PLATELET - Abnormal; Notable for the following components:      Result Value   HCT 38.7 (*)    All other components within normal limits  COMPREHENSIVE METABOLIC PANEL - Abnormal; Notable for the following components:   Calcium 8.8 (*)    All other components within normal limits  RAPID URINE DRUG SCREEN, HOSP PERFORMED - Abnormal; Notable for the following  components:   Cocaine POSITIVE (*)    Benzodiazepines POSITIVE (*)    All other components within normal limits  ETHANOL  TROPONIN I (HIGH SENSITIVITY)  TROPONIN I (HIGH SENSITIVITY)    EKG EKG Interpretation Date/Time:  Tuesday January 31 2023 09:36:23 EDT Ventricular Rate:  78 PR Interval:  136 QRS Duration:  89 QT Interval:  366 QTC Calculation: 417 R Axis:   72  Text Interpretation: Sinus rhythm ST elev, probable normal early repol pattern Confirmed by Bethann Berkshire [phone]) on 01/31/2023 11:12:45 AM  Radiology No results found.  Procedures Procedures    Medications Ordered in ED Medications  ketorolac (TORADOL) 30 MG/ML injection 30 mg (30 mg Intravenous Given 01/31/23 1116)    ED Course/ Medical Decision Making/ A&P                                 Medical Decision Making Amount and/or Complexity of Data Reviewed Labs: ordered. Radiology: ordered.  Risk Prescription drug management.   Patient with syncopal episode possible related to substance abuse.  He was observed hours in the emergency department and then was discharged home to follow-up with [facility]        Final Clinical Impression(s) / ED Diagnoses Final diagnoses:  Polysubstance abuse (HCC)  Atypical chest pain    Rx / DC Orders ED Discharge Orders          Ordered    Ambulatory referral to Cardiology       Comments: If you have not heard from the Cardiology office within the next 72 hours please call [phone].   01/31/23 1549              Bethann Berkshire, MD 02/06/23 1146

## 2023-02-06 NOTE — ED Triage Notes (Signed)
Pt coming in from [facility] for medical clearance.

## 2023-02-06 NOTE — ED Notes (Signed)
Patient in the bedroom sleeping. Seen comfortable, NAD. Patient did not go to bed until around 02:15 am.Respirations even and unlabored. Will continue to monitor for safety.

## 2023-02-06 NOTE — ED Provider Notes (Signed)
EMERGENCY DEPARTMENT AT [facility] Provider Note   CSN: [id number] Arrival date & time: 02/06/23  3086     History  Chief Complaint  Patient presents with   Medical Clearance   Chest Pain    Eric Lowery is a 37 y.o. male.  Patient with history of polysubstance abuse presents today from behavioral health with concerns for dizziness. Patient states that same began about 1 month ago and has been persistent since then. He states that he is so unsteady on his feet that he cannot walk. He states that he did not want to tell the staff at behavioral health that he could not walk due to his dizziness and has subsequently sustained several falls during attempts to ambulate.  He also notes left-sided weakness that again has been ongoing for the last 1 month. States that he does experience a room spinning sensation that is present at rest as well as with ambulation. He does also endorse a headache with neck pain which started after his fall on Friday.  Denies altered bowel or bladder function or saddle paresthesias.  Does have of IV drug use, has not used since admission to behavioral health over a week ago.   The history is provided by the patient. No language interpreter was used.  Chest Pain Associated symptoms: dizziness and weakness        Home Medications Prior to Admission medications   Medication Sig Start Date End Date Taking? Authorizing Provider  albuterol (VENTOLIN HFA) 108 (90 Base) MCG/ACT inhaler Inhale 1-2 puffs into the lungs every 6 (six) hours as needed for wheezing or shortness of breath.    [provider]  QUEtiapine (SEROQUEL) 100 MG tablet Take 100 mg by mouth at bedtime. 01/23/23   [provider]  sertraline (ZOLOFT) 50 MG tablet Take 50 mg by mouth daily.    [provider]      Allergies    Peanuts [nuts], Penicillins, Shellfish allergy, and Cyclobenzaprine hcl    Review of Systems   Review of Systems   Cardiovascular:  Positive for chest pain.  Neurological:  Positive for dizziness and weakness.  All other systems reviewed and are negative.   Physical Exam Updated Vital Signs BP 131/88 (BP Location: Right Arm)   Pulse 97   Temp 97.9 F (36.6 C) (Oral)   Resp 16   Ht 6\' 1"  (1.854 m)   Wt 81.6 kg   SpO2 100%   BMI 23.75 kg/m  Physical Exam Vitals and nursing note reviewed.  Constitutional:      General: He is not in acute distress.    Appearance: Normal appearance. He is normal weight. He is not ill-appearing, toxic-appearing or diaphoretic.  HENT:     Head: Normocephalic and atraumatic.  Cardiovascular:     Rate and Rhythm: Normal rate and regular rhythm.     Heart sounds: Normal heart sounds.  Pulmonary:     Effort: Pulmonary effort is normal. No respiratory distress.     Breath sounds: Normal breath sounds.  Musculoskeletal:        General: Normal range of motion.     Cervical back: Normal range of motion.     Right lower leg: No tenderness. No edema.     Left lower leg: No tenderness. No edema.  Skin:    General: Skin is warm and dry.  Neurological:     General: No focal deficit present.     Mental Status: He is alert  and oriented to person, place, and time.     GCS: GCS eye subscore is 4. GCS verbal subscore is 5. GCS motor subscore is 6.     Sensory: Sensation is intact.     Motor: Motor function is intact.     Coordination: Coordination is intact.     Gait: Gait is intact.     Comments: Alert and oriented to self, place, time and event.    Speech is fluent, clear without dysarthria or dysphasia.    Strength 5/5 in right upper/lower extremity, 4/5 in left upper/lower extremity Sensation intact in upper/lower extremities   Attempted to ambulate, able to stand, however unable to take a single step without substantial unsteadiness.   CN I not tested  CN II grossly intact visual fields bilaterally. Did not visualize posterior eye.  CN III, IV, VI PERRLA and  EOMs intact bilaterally  CN V Subjective sensation change to the right face, none to the left CN VII facial movements symmetric  CN VIII not tested  CN IX, X no uvula deviation, symmetric rise of soft palate  CN XI 5/5 SCM and trapezius strength bilaterally  CN XII Midline tongue protrusion, symmetric L/R movements   Marked dysmetria noted with finger-nose testing  Psychiatric:        Mood and Affect: Mood normal.        Behavior: Behavior normal.     ED Results / Procedures / Treatments   Labs (all labs ordered are listed, but only abnormal results are displayed) Labs Reviewed  CBC  BASIC METABOLIC PANEL  TROPONIN I (HIGH SENSITIVITY)  TROPONIN I (HIGH SENSITIVITY)    EKG EKG Interpretation Date/Time:  Monday February 06 2023 09:54:44 EDT Ventricular Rate:  81 PR Interval:  124 QRS Duration:  82 QT Interval:  354 QTC Calculation: 411 R Axis:   70  Text Interpretation: Normal sinus rhythm Normal ECG When compared with ECG of 02-Feb-2023 13:13, PREVIOUS ECG IS PRESENT Confirmed by Fulton Reek [id number]) on 02/06/2023 10:24:05 AM  Radiology CT Head Wo Contrast  Result Date: 02/06/2023 CLINICAL DATA:  Neuro deficit, acute, stroke suspected; Neck trauma, midline tenderness (Age 33-64y) EXAM: CT HEAD WITHOUT CONTRAST CT CERVICAL SPINE WITHOUT CONTRAST TECHNIQUE: Multidetector CT imaging of the head and cervical spine was performed following the standard protocol without intravenous contrast. Multiplanar CT image reconstructions of the cervical spine were also generated. RADIATION DOSE REDUCTION: This exam was performed according to the departmental dose-optimization program which includes automated exposure control, adjustment of the mA and/or kV according to patient size and/or use of iterative reconstruction technique. COMPARISON:  None Available. FINDINGS: CT HEAD FINDINGS Brain: No evidence of acute infarction, hemorrhage, hydrocephalus, extra-axial collection or mass  lesion/mass effect. Vascular: No hyperdense vessel or unexpected calcification. Skull: Normal. Negative for fracture or focal lesion. Sinuses/Orbits: No middle ear or mastoid effusion. Paranasal sinuses are clear. Orbits are unremarkable. Other: None. CT CERVICAL SPINE FINDINGS Alignment: Normal. Skull base and vertebrae: No acute fracture. No there new mild cortical irregularity of the superior endplate of T1. This is likely degenerative in nature. Soft tissues and spinal canal: No prevertebral fluid or swelling. No visible canal hematoma. Disc levels:  No evidence of high-grade spinal canal stenosis. Upper chest: Negative. Other: None IMPRESSION: 1. No acute intracranial abnormality. 2. No acute fracture or traumatic malalignment of the cervical spine. 3. New mild cortical irregularity of the superior endplate of T1, likely degenerative in nature. If there is clinical concern for acute fracture, consider further  evaluation with MRI. Electronically Signed   By: Lorenza Cambridge M.D.   On: 02/06/2023 13:27   CT Cervical Spine Wo Contrast  Result Date: 02/06/2023 CLINICAL DATA:  Neuro deficit, acute, stroke suspected; Neck trauma, midline tenderness (Age 78-64y) EXAM: CT HEAD WITHOUT CONTRAST CT CERVICAL SPINE WITHOUT CONTRAST TECHNIQUE: Multidetector CT imaging of the head and cervical spine was performed following the standard protocol without intravenous contrast. Multiplanar CT image reconstructions of the cervical spine were also generated. RADIATION DOSE REDUCTION: This exam was performed according to the departmental dose-optimization program which includes automated exposure control, adjustment of the mA and/or kV according to patient size and/or use of iterative reconstruction technique. COMPARISON:  None Available. FINDINGS: CT HEAD FINDINGS Brain: No evidence of acute infarction, hemorrhage, hydrocephalus, extra-axial collection or mass lesion/mass effect. Vascular: No hyperdense vessel or unexpected  calcification. Skull: Normal. Negative for fracture or focal lesion. Sinuses/Orbits: No middle ear or mastoid effusion. Paranasal sinuses are clear. Orbits are unremarkable. Other: None. CT CERVICAL SPINE FINDINGS Alignment: Normal. Skull base and vertebrae: No acute fracture. No there new mild cortical irregularity of the superior endplate of T1. This is likely degenerative in nature. Soft tissues and spinal canal: No prevertebral fluid or swelling. No visible canal hematoma. Disc levels:  No evidence of high-grade spinal canal stenosis. Upper chest: Negative. Other: None IMPRESSION: 1. No acute intracranial abnormality. 2. No acute fracture or traumatic malalignment of the cervical spine. 3. New mild cortical irregularity of the superior endplate of T1, likely degenerative in nature. If there is clinical concern for acute fracture, consider further evaluation with MRI. Electronically Signed   By: Lorenza Cambridge M.D.   On: 02/06/2023 13:27   DG Chest 2 View  Result Date: 02/06/2023 CLINICAL DATA:  Chest pain for 4 days EXAM: CHEST - 2 VIEW COMPARISON:  X-ray 01/31/2023 FINDINGS: The heart size and mediastinal contours are within normal limits. Hyperinflation. No consolidation, pneumothorax or effusion. No edema. The visualized skeletal structures are unremarkable. Curvature of the spine. Overlapping cardiac leads. IMPRESSION: Hyperinflation with chronic changes. No acute cardiopulmonary disease. Electronically Signed   By: Karen Kays M.D.   On: 02/06/2023 12:07    Procedures Procedures    Medications Ordered in ED Medications  LORazepam (ATIVAN) tablet 1 mg (1 mg Oral Given 02/06/23 1205)    ED Course/ Medical Decision Making/ A&P                                 Medical Decision Making Amount and/or Complexity of Data Reviewed Labs: ordered. Radiology: ordered.  Risk Prescription drug management.   This patient is a 37 y.o. male who presents to the ED for concern of dizziness, this  involves an extensive number of treatment options, and is a complaint that carries with it a high risk of complications and morbidity. The emergent differential diagnosis prior to evaluation includes, but is not limited to, endocarditis, BPPV, vestibular migraine, head trauma, AVM, intracranial tumor, multiple sclerosis, drug-related, CVA, vasovagal syncope, orthostatic hypotension, sepsis, hypoglycemia, electrolyte disturbance, anemia, anxiety/panic attack  This is not an exhaustive differential.   Past Medical History / Co-morbidities / Social History:  has a past medical history of Asthma, Chronic back pain, Chronic neck pain, GERD (gastroesophageal reflux disease), and Hypertension.  Additional history: Chart reviewed. Pertinent results include: Here on 10/14 after drug overdose.  Medically cleared and sent to behavioral health.  Psychiatry notes that the patient has  been confused, however he is alert and oriented here.  They do note that he is had significant difficulty ambulating and has been having to use a walker.  This is new for him.  Initially thought to be due to Seroquel which was discontinued without any improvement.  Several unwitnessed falls while at behavioral health.  Sent here for evaluation of this.  Physical Exam: Physical exam performed. The pertinent findings include: Per above, weakness noted in left upper and left lower extremities.  Sensation change to the right face.  Dysmetria with finger-to-nose testing.  Unable to ambulate.  Lab Tests: I ordered, and personally interpreted labs.  The pertinent results include: No acute laboratory abnormalities.  UDS negative.   Imaging Studies: I ordered imaging studies including CT head, neck, CXR. I independently visualized and interpreted imaging which showed   CXR: Hyperinflation with chronic changes. No acute cardiopulmonary disease.  CT:  1. No acute intracranial abnormality. 2. No acute fracture or traumatic malalignment  of the cervical spine. 3. New mild cortical irregularity of the superior endplate of T1, likely degenerative in nature. If there is clinical concern for acute fracture, consider further evaluation with MRI.  I agree with the radiologist interpretation.  MRI brain and cervical spine pending at shift change.   Cardiac Monitoring:  The patient was maintained on a cardiac monitor.  My attending physician Dr. Earlene Plater viewed and interpreted the cardiac monitored which showed an underlying rhythm of: sinus rhythm. I agree with this interpretation.   Medications: I ordered medication including ativan  for anxiety. Reevaluation of the patient after these medicines showed that the patient improved. I have reviewed the patients home medicines and have made adjustments as needed.  Consultations Obtained: I requested consultation with the neurology on-call Dr. Amada Jupiter,  and discussed lab and imaging findings as well as pertinent plan - they recommend: MRI brain and cervical spine with and without contrast.  Plan to reassess pending these results.   Disposition:  Patient with dizziness and neurodeficits, unable to ambulate.  Suspect he will need admission for same.  His MRI brain and cervical spine are pending at shift change and will determine dispo.  He also notes left-sided chest pain radiating to his left shoulder.  No murmurs heard on exam, however he does have a history of IV drug use potentially concerning for endocarditis.  Blood cultures pending.  Care handoff to [facility], PA-C at shift change.  Please see their note for continued evaluation and dispo.  This is a shared visit with supervising physician Dr. Earlene Plater who has independently evaluated patient & provided guidance in evaluation/management/disposition, in agreement with care    Final Clinical Impression(s) / ED Diagnoses Final diagnoses:  None    Rx / DC Orders ED Discharge Orders     None         Vear Clock 02/08/23 1323    Laurence Spates, MD 02/10/23 1555

## 2023-02-06 NOTE — Discharge Planning (Signed)
LCSW provided update that patient has been transported to ED for screening. If patient is medically cleared then he will return  back to [facility] for continued care. Patient is being considered for residential placement at this time, and was advised on Friday to complete phone screenings with [facility] [phone] and [facility] in [place] [phone]. Unsure if phone calls were made. Patient was confused, using a walker, and felt unstable. LCSW will follow up if patient returns.   No other needs to report at this time.   Fernande Boyden, LCSW Clinical Social Worker [facility] Ph: [phone]

## 2023-02-06 NOTE — ED Provider Notes (Incomplete)
Behavioral Health Progress Note  Date and Time: 02/06/2023 8:11 AM Name: Eric Lowery MRN:  [id number]  Eric Lowery is a 37 yo male with a PPHx of opioid use disorder, stimulant use disorder, and tobacco use disorder, admitted to [facility] on 10/17 for detox from recent fentanyl and cocaine use with interest in [facility]idential [facility]. He reports a hx of IV drug use. UDS on admission is positive for buprenorphine, benzodiazepines, and cocaine.   Subjective:  ***feels better since starting subutex.  Working through the alcohol anonymous. Has not spoken with family. Describes feeling guilty and ashmed of himself, feels that he "has come to terms with all that I have done". Dizziness has improved since stopping seroquel. Using roaming walker to "keep himself". Feet don't hurt as much.  Working through daily check in: care for people, love life, humble. Pt wanted to work on worksheets at 2 AM.    Denies si, hi, avh.  Aggravated by new patient on the unit.   Pt is finding prazosin.   Since stopping serouel, pt reports insomnia has worsened, feels "flip-floopy". Racing thoughts. Shwoing me a bunch of worksheets. Has been working on these since 2 AM.   Reports having episodes like this in the past, last episode was 2.5 months.   Substance Use Hx: Fentanyl: IV use 3.5 grams 2 days ago. Uses it daily for the past 2.5 months.  Cocaine: Report using "hours" prior to admission to [facility] on 02/02/2023, intranasal. 2.5-3.0 grams daily for /the past 2 months (since wife separated) Tobacco: 2 ppd for the past 20 years Alcohol - Drinks moonshine nightly when he comes back from work, around 0.5 gallons a night.  Patient has participated in rehab with [facility] in [place] and in [place]. Pt recently enrolled in substance abuse treatment program with [facility] but left after 15 hours because "it was a faith based program and I don't agree with their philosophy."    Past Psychiatric Hx: Current  Psychiatrist: Dr. April Manson, in [place] Previous Psychiatric Diagnoses: pt is unable to specify any formal previous psychiatric diagnoses Psychiatric medication history/compliance: reports compliance   Past Medical History: Allergies: penicillins, shellfish, peanuts Seizures: reports previous hx of epilepsy, has not had a seizure in "years", reports previously being prescribed Keppra.    Social History: Marital Status: Married, separated from wife Children: 3 children Legal: Upcoming court date in December to get his license 2/2 texting and driving. No past hx of incarcerations or legal charges.  Military: No   Diagnosis:  Final diagnoses:  Tobacco use disorder  On high dose antipsychotic drug therapy  Opioid use with withdrawal (HCC)  Alcohol abuse    Total Time spent with patient: 45 minutes   Current Medications:  Current Facility-Administered Medications  Medication Dose Route Frequency Provider Last Rate Last Admin   acetaminophen (TYLENOL) tablet 650 mg  650 mg Oral Q6H PRN Meryl Dare, MD   650 mg at 02/04/23 0915   alum & mag hydroxide-simeth (MAALOX/MYLANTA) 200-200-20 MG/5ML suspension 30 mL  30 mL Oral Q4H PRN Meryl Dare, MD   30 mL at 02/02/23 1916   buprenorphine (SUBUTEX) SL tablet 2 mg  2 mg Sublingual BID Lamar Sprinkles, MD   2 mg at 02/05/23 2120   dicyclomine (BENTYL) tablet 20 mg  20 mg Oral Q6H PRN Meryl Dare, MD   20 mg at 02/03/23 2138   hydrOXYzine (ATARAX) tablet 25 mg  25 mg Oral Q6H PRN Meryl Dare, MD   25 mg  at 02/05/23 1410   loperamide (IMODIUM) capsule 2-4 mg  2-4 mg Oral PRN Meryl Dare, MD       magnesium hydroxide (MILK OF MAGNESIA) suspension 30 mL  30 mL Oral Daily PRN Meryl Dare, MD   30 mL at 02/05/23 1045   methocarbamol (ROBAXIN) tablet 500 mg  500 mg Oral Q8H PRN Meryl Dare, MD   500 mg at 02/05/23 2046   naproxen (NAPROSYN) tablet 500 mg  500 mg Oral BID PRN Meryl Dare, MD   500 mg at 02/05/23 2046    nicotine (NICODERM CQ - dosed in mg/24 hours) patch 21 mg  21 mg Transdermal Daily Meryl Dare, MD   21 mg at 02/05/23 1610   nicotine polacrilex (NICORETTE) gum 4 mg  4 mg Oral PRN Meryl Dare, MD   4 mg at 02/05/23 2123   ondansetron (ZOFRAN-ODT) disintegrating tablet 4 mg  4 mg Oral Q6H PRN Meryl Dare, MD   4 mg at 02/05/23 1531   prazosin (MINIPRESS) capsule 1 mg  1 mg Oral QHS Lamar Sprinkles, MD   1 mg at 02/05/23 2121   sertraline (ZOLOFT) tablet 50 mg  50 mg Oral Daily Carrion-Carrero, Karle Starch, MD   50 mg at 02/05/23 0935   thiamine (VITAMIN B1) injection 100 mg  100 mg Intramuscular Once Carrion-Carrero, Karle Starch, MD       thiamine (VITAMIN B1) tablet 100 mg  100 mg Oral Daily Carrion-Carrero, Mazie Fencl, MD       traZODone (DESYREL) tablet 50 mg  50 mg Oral QHS,MR X 1 Lamar Sprinkles, MD   50 mg at 02/06/23 0134   Current Outpatient Medications  Medication Sig Dispense Refill   albuterol (VENTOLIN HFA) 108 (90 Base) MCG/ACT inhaler Inhale 1-2 puffs into the lungs every 6 (six) hours as needed for wheezing or shortness of breath.     QUEtiapine (SEROQUEL) 100 MG tablet Take 100 mg by mouth at bedtime.     sertraline (ZOLOFT) 50 MG tablet Take 50 mg by mouth daily.      Labs  Lab Results:  Admission on 02/02/2023  Component Date Value Ref Range Status   Hgb A1c MFr Bld 02/03/2023 5.5  4.8 - 5.6 % Final   Comment: (NOTE) Pre diabetes:          5.7%-6.4%  Diabetes:              >6.4%  Glycemic control for   <7.0% adults with diabetes    Mean Plasma Glucose 02/03/2023 111.15  mg/dL Final   Performed at [facility], [address], [place], [place] [address]   Cholesterol 02/03/2023 157  0 - 200 mg/dL Final   Triglycerides 40/98/1191 75  <150 mg/dL Final   HDL 47/82/9562 45  >40 mg/dL Final   Total CHOL/HDL Ratio 02/03/2023 3.5  RATIO Final   VLDL 02/03/2023 15  0 - 40 mg/dL Final   LDL Cholesterol 02/03/2023 97  0 - 99 mg/dL Final   Comment:        Total  Cholesterol/HDL:CHD Risk Coronary Heart Disease Risk Table                     Men   Women  1/2 Average Risk   3.4   3.3  Average Risk       5.0   4.4  2 X Average Risk   9.6   7.1  3 X Average Risk  23.4   11.0  Use the calculated Patient Ratio above and the CHD Risk Table to determine the patient's CHD Risk.        ATP III CLASSIFICATION (LDL):  <100     mg/dL   Optimal  161-096  mg/dL   Near or Above                    Optimal  130-159  mg/dL   Borderline  045-409  mg/dL   High  >811     mg/dL   Very High Performed at [facility], [address], [place], [place] [address]    TSH 02/03/2023 0.333 (L)  0.350 - 4.500 uIU/mL Final   Comment: Performed by a 3rd Generation assay with a functional sensitivity of <=0.01 uIU/mL. Performed at [facility], [address] [place], [place] [address]    Free T4 02/04/2023 0.92  0.61 - 1.12 ng/dL Final   Comment: (NOTE) Biotin ingestion may interfere with free T4 tests. If the results are inconsistent with the TSH level, previous test results, or the clinical presentation, then consider biotin interference. If needed, order repeat testing after stopping biotin. Performed at [facility], [address], [place], [place] [address]   Admission on 02/02/2023, Discharged on 02/02/2023  Component Date Value Ref Range Status   HIV Screen 4th Generation wRfx 02/02/2023 Non Reactive  Non Reactive Final   Performed at [facility], [address] [place], [place] [address]   POC Amphetamine UR 02/02/2023 None Detected  NONE DETECTED (Cut Off Level 1000 ng/mL) Final   POC Secobarbital (BAR) 02/02/2023 None Detected  NONE DETECTED (Cut Off Level 300 ng/mL) Final   POC Buprenorphine (BUP) 02/02/2023 Positive (A)  NONE DETECTED (Cut Off Level 10 ng/mL) Final   POC Oxazepam (BZO) 02/02/2023 Positive (A)  NONE DETECTED (Cut Off Level 300 ng/mL) Final   POC Cocaine UR 02/02/2023 Positive (A)  NONE DETECTED (Cut Off  Level 300 ng/mL) Final   POC Methamphetamine UR 02/02/2023 None Detected  NONE DETECTED (Cut Off Level 1000 ng/mL) Final   POC Morphine 02/02/2023 None Detected  NONE DETECTED (Cut Off Level 300 ng/mL) Final   POC Methadone UR 02/02/2023 None Detected  NONE DETECTED (Cut Off Level 300 ng/mL) Final   POC Oxycodone UR 02/02/2023 None Detected  NONE DETECTED (Cut Off Level 100 ng/mL) Final   POC Marijuana UR 02/02/2023 None Detected  NONE DETECTED (Cut Off Level 50 ng/mL) Final   TSH 02/02/2023 0.244 (L)  0.350 - 4.500 uIU/mL Final   Comment: Performed by a 3rd Generation assay with a functional sensitivity of <=0.01 uIU/mL. Performed at [facility], [address] [place], [place] [address]    Cholesterol 02/02/2023 177  0 - 200 mg/dL Final   Triglycerides 28/41/3244 88  <150 mg/dL Final   HDL 04/20/7251 58  >40 mg/dL Final   Total CHOL/HDL Ratio 02/02/2023 3.1  RATIO Final   VLDL 02/02/2023 18  0 - 40 mg/dL Final   LDL Cholesterol 02/02/2023 101 (H)  0 - 99 mg/dL Final   Comment:        Total Cholesterol/HDL:CHD Risk Coronary Heart Disease Risk Table                     Men   Women  1/2 Average Risk   3.4   3.3  Average Risk       5.0   4.4  2 X  Average Risk   9.6   7.1  3 X Average Risk  23.4   11.0        Use the calculated Patient Ratio above and the CHD Risk Table to determine the patient's CHD Risk.        ATP III CLASSIFICATION (LDL):  <100     mg/dL   Optimal  562-130  mg/dL   Near or Above                    Optimal  130-159  mg/dL   Borderline  865-784  mg/dL   High  >696     mg/dL   Very High Performed at [facility], [address], [place], [place] [address]    HCV Quantitative 02/02/2023 HCV Not Detected  >50 IU/mL Final   Test Information 02/02/2023 Comment   Final   Comment: (NOTE) The quantitative range of this assay is 15 IU/mL to 100 million IU/mL. Performed At: [facility] [address] [place], [place] [id number] Jolene Schimke  MD [id number]    Hep B S AB Quant (Post) 02/02/2023 10.3  Immunity>10 mIU/mL Final   Comment: (NOTE)  Status of Immunity                     Anti-HBs Level  ------------------                     -------------- Inconsistent with Immunity                  0.0 - 10.0 Consistent with Immunity                         >10.0 Performed At: [facility] [address] [place], [place] [id number] Jolene Schimke MD [id number]   Admission on 01/31/2023, Discharged on 01/31/2023  Component Date Value Ref Range Status   WBC 01/31/2023 6.4  4.0 - 10.5 K/uL Final   RBC 01/31/2023 4.43  4.22 - 5.81 MIL/uL Final   Hemoglobin 01/31/2023 13.1  13.0 - 17.0 g/dL Final   HCT 41/66/0630 38.7 (L)  39.0 - 52.0 % Final   MCV 01/31/2023 87.4  80.0 - 100.0 fL Final   MCH 01/31/2023 29.6  26.0 - 34.0 pg Final   MCHC 01/31/2023 33.9  30.0 - 36.0 g/dL Final   RDW 16/04/930 12.4  11.5 - 15.5 % Final   Platelets 01/31/2023 311  150 - 400 K/uL Final   nRBC 01/31/2023 0.0  0.0 - 0.2 % Final   Neutrophils Relative % 01/31/2023 55  % Final   Neutro Abs 01/31/2023 3.5  1.7 - 7.7 K/uL Final   Lymphocytes Relative 01/31/2023 30  % Final   Lymphs Abs 01/31/2023 1.9  0.7 - 4.0 K/uL Final   Monocytes Relative 01/31/2023 12  % Final   Monocytes Absolute 01/31/2023 0.8  0.1 - 1.0 K/uL Final   Eosinophils Relative 01/31/2023 3  % Final   Eosinophils Absolute 01/31/2023 0.2  0.0 - 0.5 K/uL Final   Basophils Relative 01/31/2023 0  % Final   Basophils Absolute 01/31/2023 0.0  0.0 - 0.1 K/uL Final   Immature Granulocytes 01/31/2023 0  % Final   Abs Immature Granulocytes 01/31/2023 0.01  0.00 - 0.07 K/uL Final   Performed at [facility], [address], [place], [place] [address]   Sodium 01/31/2023 136  135 - 145 mmol/L Final   Potassium  01/31/2023 3.9  3.5 - 5.1 mmol/L Final   Chloride 01/31/2023 100  98 - 111 mmol/L Final   CO2 01/31/2023 28  22 - 32 mmol/L Final   Glucose, Bld 01/31/2023 83  70 - 99 mg/dL  Final   Glucose reference range applies only to samples taken after fasting for at least 8 hours.   BUN 01/31/2023 13  6 - 20 mg/dL Final   Creatinine, Ser 01/31/2023 0.80  0.61 - 1.24 mg/dL Final   Calcium 60/45/4098 8.8 (L)  8.9 - 10.3 mg/dL Final   Total Protein 11/91/4782 6.6  6.5 - 8.1 g/dL Final   Albumin 95/62/1308 3.7  3.5 - 5.0 g/dL Final   AST 65/78/4696 19  15 - 41 U/L Final   ALT 01/31/2023 15  0 - 44 U/L Final   Alkaline Phosphatase 01/31/2023 75  38 - 126 U/L Final   Total Bilirubin 01/31/2023 0.5  0.3 - 1.2 mg/dL Final   GFR, Estimated 01/31/2023 >60  >60 mL/min Final   Comment: (NOTE) Calculated using the CKD-EPI Creatinine Equation (2021)    Anion gap 01/31/2023 8  5 - 15 Final   Performed at [facility], [address], [place], [place] [address]   Troponin I (High Sensitivity) 01/31/2023 8  <18 ng/L Final   Comment: (NOTE) Elevated high sensitivity troponin I (hsTnI) values and significant  changes across serial measurements may suggest ACS but many other  chronic and acute conditions are known to elevate hsTnI results.  Refer to the "Links" section for chest pain algorithms and additional  guidance. Performed at [facility], [address], [place], [place] [address]    Alcohol, Ethyl (B) 01/31/2023 <10  <10 mg/dL Final   Comment: (NOTE) Lowest detectable limit for serum alcohol is 10 mg/dL.  For medical purposes only. Performed at [facility], [address], [place], [place] [address]    Opiates 01/31/2023 NONE DETECTED  NONE DETECTED Final   Cocaine 01/31/2023 POSITIVE (A)  NONE DETECTED Final   Benzodiazepines 01/31/2023 POSITIVE (A)  NONE DETECTED Final   Amphetamines 01/31/2023 NONE DETECTED  NONE DETECTED Final   Tetrahydrocannabinol 01/31/2023 NONE DETECTED  NONE DETECTED Final   Barbiturates 01/31/2023 NONE DETECTED  NONE DETECTED Final   Comment: (NOTE) DRUG SCREEN FOR MEDICAL PURPOSES ONLY.  IF CONFIRMATION IS NEEDED FOR ANY PURPOSE, NOTIFY  LAB WITHIN 5 DAYS.  LOWEST DETECTABLE LIMITS FOR URINE DRUG SCREEN Drug Class                     Cutoff (ng/mL) Amphetamine and metabolites    1000 Barbiturate and metabolites    200 Benzodiazepine                 200 Opiates and metabolites        300 Cocaine and metabolites        300 THC                            50 Performed at [facility], [address], [place], [place] [address]    Troponin I (High Sensitivity) 01/31/2023 5  <18 ng/L Final   Comment: (NOTE) Elevated high sensitivity troponin I (hsTnI) values and significant  changes across serial measurements may suggest ACS but many other  chronic and acute conditions are known to elevate hsTnI results.  Refer to the "Links" section for chest pain algorithms and additional  guidance. Performed at [facility], [address] [place], [place] [address]   Admission on 01/30/2023, Discharged on 01/31/2023  Component Date Value Ref Range Status   Glucose-Capillary 01/30/2023 103 (H)  70 - 99 mg/dL Final   Glucose reference range applies only to samples taken after fasting for at least 8 hours.   WBC 01/30/2023 12.2 (H)  4.0 - 10.5 K/uL Final   RBC 01/30/2023 4.56  4.22 - 5.81 MIL/uL Final   Hemoglobin 01/30/2023 13.3  13.0 - 17.0 g/dL Final   HCT 19/14/7829 39.6  39.0 - 52.0 % Final   MCV 01/30/2023 86.8  80.0 - 100.0 fL Final   MCH 01/30/2023 29.2  26.0 - 34.0 pg Final   MCHC 01/30/2023 33.6  30.0 - 36.0 g/dL Final   RDW 56/21/3086 12.3  11.5 - 15.5 % Final   Platelets 01/30/2023 344  150 - 400 K/uL Final   nRBC 01/30/2023 0.0  0.0 - 0.2 % Final   Neutrophils Relative % 01/30/2023 80  % Final   Neutro Abs 01/30/2023 9.7 (H)  1.7 - 7.7 K/uL Final   Lymphocytes Relative 01/30/2023 11  % Final   Lymphs Abs 01/30/2023 1.4  0.7 - 4.0 K/uL Final   Monocytes Relative 01/30/2023 8  % Final   Monocytes Absolute 01/30/2023 1.0  0.1 - 1.0 K/uL Final   Eosinophils Relative 01/30/2023 1  % Final   Eosinophils Absolute  01/30/2023 0.1  0.0 - 0.5 K/uL Final   Basophils Relative 01/30/2023 0  % Final   Basophils Absolute 01/30/2023 0.0  0.0 - 0.1 K/uL Final   Immature Granulocytes 01/30/2023 0  % Final   Abs Immature Granulocytes 01/30/2023 0.04  0.00 - 0.07 K/uL Final   Performed at [facility], [address], [place], [place] [address]   Sodium 01/30/2023 135  135 - 145 mmol/L Final   Potassium 01/30/2023 4.3  3.5 - 5.1 mmol/L Final   Chloride 01/30/2023 100  98 - 111 mmol/L Final   CO2 01/30/2023 27  22 - 32 mmol/L Final   Glucose, Bld 01/30/2023 100 (H)  70 - 99 mg/dL Final   Glucose reference range applies only to samples taken after fasting for at least 8 hours.   BUN 01/30/2023 16  6 - 20 mg/dL Final   Creatinine, Ser 01/30/2023 0.83  0.61 - 1.24 mg/dL Final   Calcium 96/29/5284 8.9  8.9 - 10.3 mg/dL Final   Total Protein 13/24/4010 7.0  6.5 - 8.1 g/dL Final   Albumin 27/25/3664 4.1  3.5 - 5.0 g/dL Final   AST 40/34/7425 21  15 - 41 U/L Final   ALT 01/30/2023 14  0 - 44 U/L Final   Alkaline Phosphatase 01/30/2023 79  38 - 126 U/L Final   Total Bilirubin 01/30/2023 0.4  0.3 - 1.2 mg/dL Final   GFR, Estimated 01/30/2023 >60  >60 mL/min Final   Comment: (NOTE) Calculated using the CKD-EPI Creatinine Equation (2021)    Anion gap 01/30/2023 8  5 - 15 Final   Performed at [facility], [address] [place], [place] [address]   Opiates 01/30/2023 NONE DETECTED  NONE DETECTED Final   Cocaine 01/30/2023 POSITIVE (A)  NONE DETECTED Final   Benzodiazepines 01/30/2023 POSITIVE (A)  NONE DETECTED Final   Amphetamines 01/30/2023 NONE DETECTED  NONE DETECTED Final   Tetrahydrocannabinol 01/30/2023 NONE DETECTED  NONE DETECTED Final   Barbiturates 01/30/2023 NONE DETECTED  NONE DETECTED Final   Comment: (NOTE) DRUG SCREEN FOR MEDICAL PURPOSES ONLY.  IF  CONFIRMATION IS NEEDED FOR ANY PURPOSE, NOTIFY LAB WITHIN 5 DAYS.  LOWEST DETECTABLE LIMITS FOR URINE DRUG SCREEN Drug Class                      Cutoff (ng/mL) Amphetamine and metabolites    1000 Barbiturate and metabolites    200 Benzodiazepine                 200 Opiates and metabolites        300 Cocaine and metabolites        300 THC                            50 Performed at [facility], [address] [place], [place] [address]    Troponin I (High Sensitivity) 01/30/2023 <2  <18 ng/L Final   Comment: (NOTE) Elevated high sensitivity troponin I (hsTnI) values and significant  changes across serial measurements may suggest ACS but many other  chronic and acute conditions are known to elevate hsTnI results.  Refer to the "Links" section for chest pain algorithms and additional  guidance. Performed at [facility], [address], [place], [place] [address]    Troponin I (High Sensitivity) 01/30/2023 <2  <18 ng/L Final   Comment: (NOTE) Elevated high sensitivity troponin I (hsTnI) values and significant  changes across serial measurements may suggest ACS but many other  chronic and acute conditions are known to elevate hsTnI results.  Refer to the "Links" section for chest pain algorithms and additional  guidance. Performed at [facility], [address], [place], [place] [address]   Admission on 01/05/2023, Discharged on 01/05/2023  Component Date Value Ref Range Status   Sodium 01/05/2023 138  135 - 145 mmol/L Final   Potassium 01/05/2023 4.2  3.5 - 5.1 mmol/L Final   Chloride 01/05/2023 101  98 - 111 mmol/L Final   CO2 01/05/2023 26  22 - 32 mmol/L Final   Glucose, Bld 01/05/2023 115 (H)  70 - 99 mg/dL Final   Glucose reference range applies only to samples taken after fasting for at least 8 hours.   BUN 01/05/2023 13  6 - 20 mg/dL Final   Creatinine, Ser 01/05/2023 0.91  0.61 - 1.24 mg/dL Final   Calcium 25/36/6440 10.0  8.9 - 10.3 mg/dL Final   Total Protein 34/74/2595 7.7  6.5 - 8.1 g/dL Final   Albumin 63/87/5643 4.5  3.5 - 5.0 g/dL Final   AST 32/95/1884 20  15 - 41 U/L Final   ALT 01/05/2023 16  0 - 44 U/L  Final   Alkaline Phosphatase 01/05/2023 79  38 - 126 U/L Final   Total Bilirubin 01/05/2023 0.9  0.3 - 1.2 mg/dL Final   GFR, Estimated 01/05/2023 >60  >60 mL/min Final   Comment: (NOTE) Calculated using the CKD-EPI Creatinine Equation (2021)    Anion gap 01/05/2023 11  5 - 15 Final   Performed at [facility], [address], [place], [place] [address]   Alcohol, Ethyl (B) 01/05/2023 <10  <10 mg/dL Final   Comment: (NOTE) Lowest detectable limit for serum alcohol is 10 mg/dL.  For medical purposes only. Performed at [facility], [address] [place], [place] [address]    WBC 01/05/2023 8.7  4.0 - 10.5 K/uL Final   RBC 01/05/2023 5.23  4.22 - 5.81 MIL/uL Final   Hemoglobin 01/05/2023 15.4  13.0 - 17.0 g/dL Final   HCT  01/05/2023 46.2  39.0 - 52.0 % Final   MCV 01/05/2023 88.3  80.0 - 100.0 fL Final   MCH 01/05/2023 29.4  26.0 - 34.0 pg Final   MCHC 01/05/2023 33.3  30.0 - 36.0 g/dL Final   RDW 16/01/9603 12.7  11.5 - 15.5 % Final   Platelets 01/05/2023 334  150 - 400 K/uL Final   nRBC 01/05/2023 0.0  0.0 - 0.2 % Final   Performed at [facility], [address], [place], [place] [address]   Opiates 01/05/2023 NONE DETECTED  NONE DETECTED Final   Cocaine 01/05/2023 POSITIVE (A)  NONE DETECTED Final   Benzodiazepines 01/05/2023 NONE DETECTED  NONE DETECTED Final   Amphetamines 01/05/2023 POSITIVE (A)  NONE DETECTED Final   Tetrahydrocannabinol 01/05/2023 POSITIVE (A)  NONE DETECTED Final   Barbiturates 01/05/2023 NONE DETECTED  NONE DETECTED Final   Comment: (NOTE) DRUG SCREEN FOR MEDICAL PURPOSES ONLY.  IF CONFIRMATION IS NEEDED FOR ANY PURPOSE, NOTIFY LAB WITHIN 5 DAYS.  LOWEST DETECTABLE LIMITS FOR URINE DRUG SCREEN Drug Class                     Cutoff (ng/mL) Amphetamine and metabolites    1000 Barbiturate and metabolites    200 Benzodiazepine                 200 Opiates and metabolites        300 Cocaine and metabolites        300 THC                             50 Performed at [facility], [address], [place], [place] [address]     Blood Alcohol level:  Lab Results  Component Value Date   [facility] <10 01/31/2023   ETH <10 01/05/2023    Metabolic Disorder Labs: Lab Results  Component Value Date   HGBA1C 5.5 02/03/2023   MPG 111.15 02/03/2023   No results found for: "PROLACTIN" Lab Results  Component Value Date   CHOL 157 02/03/2023   TRIG 75 02/03/2023   HDL 45 02/03/2023   CHOLHDL 3.5 02/03/2023   VLDL 15 02/03/2023   LDLCALC 97 02/03/2023   LDLCALC 101 (H) 02/02/2023    Therapeutic Lab Levels: No results found for: "LITHIUM" No results found for: "VALPROATE" No results found for: "CBMZ"  Physical Findings   AUDIT    Flowsheet Row ED from 02/02/2023 in [facility]  Alcohol Use Disorder Identification Test Final Score (AUDIT) 34      PHQ2-9    Flowsheet Row ED from 02/02/2023 in [facility] Office Visit from 01/04/2022 in [facility]  PHQ-2 Total Score 2 0  PHQ-9 Total Score 15 --      Flowsheet Row ED from 02/02/2023 in [facility] Most recent reading at 02/02/2023  2:13 PM ED from 02/02/2023 in [facility] Most recent reading at 02/02/2023 11:41 AM ED from 01/31/2023 in [facility] Emergency Department at [facility] Most recent reading at 01/31/2023  9:44 AM  C-SSRS RISK CATEGORY No Risk No Risk No Risk        Musculoskeletal  Strength & Muscle Tone: within normal limits Gait & Station: normal Patient leans: N/A  Psychiatric Specialty Exam  Presentation  General Appearance:  Appropriate for Environment; Casual  Eye Contact: Good  Speech: Clear and  Coherent; Normal Rate  Speech Volume: Normal  Handedness: Right   Mood and Affect  Mood: Euthymic; Anxious  Affect: Appropriate; Congruent   Thought Process  Thought Processes: Coherent;  Linear  Descriptions of Associations:Intact  Orientation:Full (Time, Place and Person)  Thought Content:Logical; WDL  Diagnosis of Schizophrenia or Schizoaffective disorder in past: No    Hallucinations:Hallucinations: None   Ideas of Reference:None  Suicidal Thoughts:Suicidal Thoughts: No   Homicidal Thoughts:Homicidal Thoughts: No    Sensorium  Memory: Immediate Good; Recent Good  Judgment: Fair  Insight: Fair   Art therapist  Concentration: Good  Attention Span: Good  Recall: Good  Fund of Knowledge: Good  Language: Good   Psychomotor Activity  Psychomotor Activity: Psychomotor Activity: Normal    Assets  Assets: Communication Skills; Desire for Improvement; Financial Resources/Insurance   Sleep  Sleep: Sleep: Good    No data recorded   Physical Exam  Physical Exam Vitals and nursing note reviewed.  Constitutional:      General: He is not in acute distress.    Appearance: He is not ill-appearing.  HENT:     Head: Normocephalic and atraumatic.  Pulmonary:     Effort: Pulmonary effort is normal. No respiratory distress.  Skin:    General: Skin is warm and dry.  Neurological:     General: No focal deficit present.     Mental Status: He is alert and oriented to person, place, and time.     Cranial Nerves: No cranial nerve deficit or facial asymmetry.     Sensory: No sensory deficit.     Motor: No weakness.     Coordination: Coordination normal.     Gait: Gait normal.    Review of Systems  Constitutional:  Negative for malaise/fatigue.  Respiratory:  Negative for shortness of breath.   Cardiovascular:  Negative for chest pain.  Gastrointestinal:  Positive for nausea. Negative for vomiting.  Musculoskeletal:  Negative for myalgias.  Neurological:  Positive for dizziness.       Some lightheadedness and improved dizziness.    Blood pressure 111/60, pulse 69, temperature 97.9 F (36.6 C), temperature source Oral,  resp. rate 16, SpO2 96%. There is no height or weight on file to calculate BMI.  Treatment Plan Summary: Tajon Prestage is a 37 yo male with a PPHx of opioid use disorder, stimulant use disorder, and tobacco use disorder, admitted to [facility] on 10/17 for detox from recent fentanyl and cocaine use with interest in Residential Rehabilitation.  Patient reports improvements to dizziness today after discontinuing Seroquel last night.  As mentioned in HPI, will keep an eye out for hypotensive BPs with Prazosin and Trazodone. May need to adjust dose of the latter in the future, particularly if patient symptomatic. Will increase Zoloft for PTSD related anxiety symptoms; as well, he was reassured that some degree of anxiety and sadness are normal emotions.     Medical Decision Making  Psychiatric Diagnoses: Substance induced mood disorder PTSD Opioid Use Disorder Stimulant Use Disorder Tobacco use disorder Alcohol use disorder     Psychiatric Diagnoses and Treatment:  Opioid Use Disorder COWS scoring 0 yesterday. Will discontinue monitoring.  Clonidine Taper Subutex induction schedule, 2 mg BID dosing, starting today PRNs Tylenol 650 mg every 6 hours as needed for mild pain Naproxen 500 mg BID as needed for pain Bentyl 20 mg every 6 hours as needed for spasms/abdominal cramping Robaxin 500 mg every 8 hours as needed for muscle spasms Zofran 4 mg every 6 hours as  needed for nausea or vomiting Imodium 2 to 4 mg as needed for diarrhea or loose stools Maalox/Mylanta 30 mL every 4 hours as needed for indigestion Milk of Mag 30 mL as needed for constipation    Alcohol Use Disorder CIWA 3 yesterday, and no withdrawal symptoms apparent today. Will d/c CIWA monitoring.  Substance induced mood disorder  PTSD Increase to Zoloft 100 mg daily, beginning tomorrow Continue Prazosin 1 mg at bedtime, started 10/19. Previously discontinued home Seroquel 100 mg XR nightly due to dizziness A1C 5.5% Lipid  WNL Continue Trazodone 50 mg at bedtime and x 1 PRN for insomnia   Tobacco Use Disorder Encouraged smoking cessation Nicotine patch 21 mg daily Nicorette gum PRN   Medical Issues Being Addressed:   02/03/2023:  Unwitnessed fall reported by patient on 10/18 in the afternoon. Neurological exam is unremarkable, no hematoma or redness in the occipital region of head. Do not believe patient required CT imaging at this time.   #Subclinical Hyperthyroidism; likely substance-related TSH (0.333 mIU/L) on 02/03/2023 Free T3 pending  Free T4 0.92 TSI pending    Other Labs/Imaging Reviewed: Hepatitis panel 10.3, showing immunity HIV - nonreactive CMP WNL CBC unremarkable   EKG on 02/02/2023: QTc 374     Disposition: Residential Rehabilitation, patient to continue calling [facility], [facility], [facility], [address], and [facility] to complete phone screening.   Signed: Lorri Frederick, MD PGY-3 02/06/2023 8:11 AM

## 2023-02-07 ENCOUNTER — Ambulatory Visit (HOSPITAL_COMMUNITY)
Admission: EM | Admit: 2023-02-07 | Discharge: 2023-02-07 | Disposition: A | Discharge status: Home / Self Care | Payer: Commercial Managed Care - PPO

## 2023-02-07 ENCOUNTER — Other Ambulatory Visit (HOSPITAL_COMMUNITY)
Admission: EM | Admit: 2023-02-07 | Discharge: 2023-02-08 | Disposition: A | Discharge status: Home / Self Care | Payer: Commercial Managed Care - PPO | Attending: Psychiatry | Admitting: Psychiatry

## 2023-02-07 DIAGNOSIS — F1910 Other psychoactive substance abuse, uncomplicated: Secondary | ICD-10-CM | POA: Diagnosis not present

## 2023-02-07 DIAGNOSIS — F1190 Opioid use, unspecified, uncomplicated: Secondary | ICD-10-CM

## 2023-02-07 MED ORDER — LOPERAMIDE HCL 2 MG PO CAPS
2.0000 mg | ORAL_CAPSULE | ORAL | Status: DC | PRN
  Filled 2023-02-07: qty 2

## 2023-02-07 MED ORDER — THIAMINE HCL 100 MG PO TABS
100.0000 mg | ORAL_TABLET | Freq: Every day | ORAL | Status: DC
  Filled 2023-02-07: qty 1

## 2023-02-07 MED ORDER — HYDROXYZINE HCL 25 MG PO TABS: 25.0000 mg | ORAL_TABLET | Freq: Three times a day (TID) | ORAL | Status: DC | PRN

## 2023-02-07 MED ORDER — MAGNESIUM HYDROXIDE 400 MG/5ML PO SUSP: 30.0000 mL | Freq: Every day | ORAL | Status: DC | PRN

## 2023-02-07 MED ORDER — ALBUTEROL SULFATE HFA 108 (90 BASE) MCG/ACT IN AERS: 2.0000 | INHALATION_SPRAY | RESPIRATORY_TRACT | Status: DC | PRN

## 2023-02-07 MED ORDER — DIVALPROEX SODIUM ER 500 MG PO TB24: 500.0000 mg | ORAL_TABLET | Freq: Once | ORAL | Status: DC

## 2023-02-07 MED ORDER — DIVALPROEX SODIUM 500 MG PO DR TAB
500.0000 mg | DELAYED_RELEASE_TABLET | Freq: Two times a day (BID) | ORAL | Status: DC
  Administered 2023-02-07: 500 mg via ORAL
  Filled 2023-02-07: qty 1

## 2023-02-07 MED ORDER — BUPRENORPHINE HCL 2 MG SL SUBL
2.0000 mg | SUBLINGUAL_TABLET | Freq: Two times a day (BID) | SUBLINGUAL | Status: DC
  Administered 2023-02-07 – 2023-02-08 (×3): 2 mg via SUBLINGUAL
  Filled 2023-02-07 (×3): qty 1

## 2023-02-07 MED ORDER — NICOTINE 21 MG/24HR TD PT24
21.0000 mg | MEDICATED_PATCH | Freq: Every day | TRANSDERMAL | Status: DC
  Administered 2023-02-07 – 2023-02-08 (×2): 21 mg via TRANSDERMAL
  Filled 2023-02-07 (×2): qty 1

## 2023-02-07 MED ORDER — NAPROXEN 500 MG PO TABS: 500.0000 mg | ORAL_TABLET | Freq: Two times a day (BID) | ORAL | Status: DC | PRN

## 2023-02-07 MED ORDER — ALUM & MAG HYDROXIDE-SIMETH 200-200-20 MG/5ML PO SUSP: 30.0000 mL | ORAL | Status: DC | PRN

## 2023-02-07 MED ORDER — PRAZOSIN HCL 1 MG PO CAPS
1.0000 mg | ORAL_CAPSULE | Freq: Every day | ORAL | Status: DC
  Administered 2023-02-07: 1 mg via ORAL
  Filled 2023-02-07: qty 1

## 2023-02-07 MED ORDER — QUETIAPINE FUMARATE ER 50 MG PO TB24
100.0000 mg | ORAL_TABLET | Freq: Once | ORAL | Status: AC
  Administered 2023-02-07: 100 mg via ORAL
  Filled 2023-02-07: qty 2

## 2023-02-07 MED ORDER — NICOTINE POLACRILEX 2 MG MT GUM
2.0000 mg | CHEWING_GUM | OROMUCOSAL | Status: DC | PRN
  Administered 2023-02-07: 2 mg via ORAL
  Filled 2023-02-07: qty 1

## 2023-02-07 MED ORDER — ACETAMINOPHEN 325 MG PO TABS: 650.0000 mg | ORAL_TABLET | Freq: Four times a day (QID) | ORAL | Status: DC | PRN

## 2023-02-07 MED ORDER — DIVALPROEX SODIUM ER 500 MG PO TB24
500.0000 mg | ORAL_TABLET | Freq: Every day | ORAL | Status: DC
  Administered 2023-02-07: 500 mg via ORAL
  Filled 2023-02-07: qty 1

## 2023-02-07 MED ORDER — NICOTINE POLACRILEX 2 MG MT GUM
2.0000 mg | CHEWING_GUM | OROMUCOSAL | Status: DC
  Administered 2023-02-07 – 2023-02-08 (×5): 2 mg via ORAL
  Filled 2023-02-07 (×5): qty 1

## 2023-02-07 MED ORDER — ONDANSETRON 4 MG PO TBDP: 4.0000 mg | ORAL_TABLET | Freq: Four times a day (QID) | ORAL | Status: DC | PRN

## 2023-02-07 MED ORDER — METHOCARBAMOL 500 MG PO TABS: 500.0000 mg | ORAL_TABLET | Freq: Three times a day (TID) | ORAL | Status: DC | PRN

## 2023-02-07 MED ORDER — DICYCLOMINE HCL 20 MG PO TABS: 20.0000 mg | ORAL_TABLET | Freq: Four times a day (QID) | ORAL | Status: DC | PRN

## 2023-02-07 MED ORDER — TRAZODONE HCL 50 MG PO TABS
50.0000 mg | ORAL_TABLET | Freq: Every evening | ORAL | Status: DC | PRN
  Administered 2023-02-07: 50 mg via ORAL
  Filled 2023-02-07: qty 1

## 2023-02-07 MED ORDER — HYDROXYZINE HCL 25 MG PO TABS
25.0000 mg | ORAL_TABLET | Freq: Four times a day (QID) | ORAL | Status: DC | PRN
  Administered 2023-02-07 – 2023-02-08 (×2): 25 mg via ORAL
  Filled 2023-02-07 (×2): qty 1

## 2023-02-07 MED ORDER — SERTRALINE HCL 100 MG PO TABS
100.0000 mg | ORAL_TABLET | Freq: Every day | ORAL | Status: DC
  Administered 2023-02-07: 100 mg via ORAL
  Filled 2023-02-07: qty 1

## 2023-02-07 MED ORDER — THIAMINE MONONITRATE 100 MG PO TABS
100.0000 mg | ORAL_TABLET | Freq: Every day | ORAL | Status: DC
  Administered 2023-02-07 – 2023-02-08 (×2): 100 mg via ORAL
  Filled 2023-02-07 (×2): qty 1

## 2023-02-07 NOTE — ED Notes (Signed)
ED.

## 2023-02-07 NOTE — Group Note (Signed)
Group Topic: Decisional Balance/Substance Abuse  Group Date: 02/07/2023 Start Time: 0210 End Time: 0300 Facilitators: Loleta Dicker, LCSW  Department: [facility]  Number of Participants: 2  Group Focus: affirmation, clarity of thought, daily focus, feeling awareness/expression, personal responsibility, problem solving, relapse prevention, self-awareness, and self-esteem Treatment Modality:  Cognitive Behavioral Therapy and Spiritual Interventions utilized were assignment Purpose: enhance coping skills, explore maladaptive thinking, express feelings, express irrational fears, improve communication skills, increase insight, regain self-worth, reinforce self-care, relapse prevention strategies, and trigger / craving management  Name: Eric Lowery Date of Birth: 11/07/1985  MR: [id number]    Level of Participation: active Quality of Participation: attentive, cooperative, engaged, initiates communication, and offered feedback Interactions with others: gave feedback Mood/Affect: anxious, appropriate, and restless Triggers (if applicable): Returning to the same environment Cognition: coherent/clear Progress: Gaining insight Summary of Patient Progress: Patient actively participated in group on today. Patient was able to identify areas or times in his life where he felt like it was harder to focus on the good rather than the negative. Patient reports that the video clip definitely made him reflect on the way he has probably treated others in the past or maybe made the wrong decisions. Patient reports feeling encouraged that staff has taken the time to implant knowledge within him to keep pushing forward. Patient reports feelings of conviction and the need for change after being accepted to [facility]. Patient reports he knows that hearing the words "He's ready", helped to shift his mindset from a victims mindset to a victorious mindset. Patient was  provided supportive counseling by staff and was receptive to the feedback provided at this time.  Plan: referral / recommendations  Patients Problems:  Patient Active Problem List   Diagnosis Date Noted   Substance abuse (HCC) 02/07/2023   Opioid use disorder, severe, dependence (HCC) 02/02/2023   On high dose antipsychotic drug therapy 02/02/2023

## 2023-02-07 NOTE — ED Notes (Signed)
Patient was provided lunch.

## 2023-02-07 NOTE — ED Provider Notes (Signed)
Behavioral Health Progress Note  Date and Time: 02/07/2023 12:15 PM Name: Eric Lowery MRN:  [id number]  Criag Furnish is a 37 yo male with a PPHx of opioid use disorder, stimulant use disorder, and tobacco use disorder, admitted to [facility] on 10/17 for detox from recent fentanyl and cocaine use with interest in Residential Rehabilitation. He reports a hx of IV drug use. UDS on admission is positive for buprenorphine, benzodiazepines, and cocaine.   Subjective:  Patient evaluated on the unit. He reports adequate appetite. Pt reports he has not slept for the past 48 hours, prior to going to the ED yesterday, he had described increased goal directed activity and racing thoughts. Pt states " I have so many things I need to talk about". He remains perseverative about a disruptive pt on the unit he had interacted with the day prior.   Patient reports dizziness and vertigo, stating, "I think I do have vertigo," though his descriptions have varied over time. He mentions a family history of vertigo and an unspecified cancer in his mother, reportedly requiring neurosurgery, but details remain unclear. The patient references a personal history of vertigo, described as "the room spinning," yet he has given inconsistent timelines regarding symptom onset. Today, he claims light dizzy spells began about a month ago, progressively worsening. Symptoms are said to improve with movement and worsen when sitting still or lying down, but this differs from previous reports.  The patient also mentions weakness, but no clear pattern has emerged during assessments.  Regarding chest pain and shortness of breath, he denies any issues this morning but recalls experiencing some symptoms at [facility], attributing it to food. He inconsistently reports mild shortness of breath this morning and was encouraged to notify the nurse if symptoms recur. An as-needed inhaler was discussed, though he has provided varying accounts of symptom severity  and need.   Substance Use Hx: Fentanyl: IV use 3.5 grams 2 days ago. Uses it daily for the past 2.5 months.  Cocaine: Report using "hours" prior to admission to [facility] on 02/02/2023, intranasal. 2.5-3.0 grams daily for /the past 2 months (since wife separated) Tobacco: 2 ppd for the past 20 years Alcohol - Drinks moonshine nightly when he comes back from work, around 0.5 gallons a night.  Patient has participated in rehab with [facility] in [place] and in [place]. Pt recently enrolled in substance abuse treatment program with [facility] but left after 15 hours because "it was a faith based program and I don't agree with their philosophy."    Past Psychiatric Hx: Current Psychiatrist: Dr. April Manson, in [place] Previous Psychiatric Diagnoses: pt is unable to specify any formal previous psychiatric diagnoses Psychiatric medication history/compliance: reports compliance   Past Medical History: Allergies: penicillins, shellfish, peanuts Seizures: reports previous hx of epilepsy, has not had a seizure in "years", reports previously being prescribed Keppra.    Social History: Marital Status: Married, separated from wife Children: 3 children Legal: Upcoming court date in December to get his license 2/2 texting and driving. No past hx of incarcerations or legal charges.  Military: No   Diagnosis:  Final diagnoses:  Substance abuse (HCC)  Polysubstance abuse (HCC)  Opioid use disorder    Total Time spent with patient: 45 minutes   Current Medications:  Current Facility-Administered Medications  Medication Dose Route Frequency Provider Last Rate Last Admin   acetaminophen (TYLENOL) tablet 650 mg  650 mg Oral Q6H PRN Onuoha, Chinwendu V, NP       albuterol (  VENTOLIN HFA) 108 (90 Base) MCG/ACT inhaler 2 puff  2 puff Inhalation Q4H PRN Carrion-Carrero, Samyah Bilbo, MD       alum & mag hydroxide-simeth (MAALOX/MYLANTA) 200-200-20 MG/5ML suspension 30 mL  30 mL Oral Q4H PRN Onuoha,  Chinwendu V, NP       buprenorphine (SUBUTEX) SL tablet 2 mg  2 mg Sublingual BID Onuoha, Chinwendu V, NP   2 mg at 02/07/23 6213   dicyclomine (BENTYL) tablet 20 mg  20 mg Oral Q6H PRN Onuoha, Chinwendu V, NP       divalproex (DEPAKOTE ER) 24 hr tablet 500 mg  500 mg Oral QHS Carrion-Carrero, Shantel Helwig, MD       hydrOXYzine (ATARAX) tablet 25 mg  25 mg Oral Q6H PRN Onuoha, Chinwendu V, NP       loperamide (IMODIUM) capsule 2-4 mg  2-4 mg Oral PRN Onuoha, Chinwendu V, NP       magnesium hydroxide (MILK OF MAGNESIA) suspension 30 mL  30 mL Oral Daily PRN Onuoha, Chinwendu V, NP       methocarbamol (ROBAXIN) tablet 500 mg  500 mg Oral Q8H PRN Onuoha, Chinwendu V, NP       naproxen (NAPROSYN) tablet 500 mg  500 mg Oral BID PRN Onuoha, Chinwendu V, NP       nicotine (NICODERM CQ - dosed in mg/24 hours) patch 21 mg  21 mg Transdermal Q0600 Onuoha, Chinwendu V, NP   21 mg at 02/07/23 0865   nicotine polacrilex (NICORETTE) gum 2 mg  2 mg Oral Q4H while awake Carrion-Carrero, Elmarie Devlin, MD       ondansetron (ZOFRAN-ODT) disintegrating tablet 4 mg  4 mg Oral Q6H PRN Onuoha, Chinwendu V, NP       prazosin (MINIPRESS) capsule 1 mg  1 mg Oral QHS Onuoha, Chinwendu V, NP       thiamine (VITAMIN B1) tablet 100 mg  100 mg Oral Daily Chilton Si, Terri L, RPH   100 mg at 02/07/23 7846   traZODone (DESYREL) tablet 50 mg  50 mg Oral QHS PRN Onuoha, Chinwendu V, NP       Current Outpatient Medications  Medication Sig Dispense Refill   albuterol (VENTOLIN HFA) 108 (90 Base) MCG/ACT inhaler Inhale 1-2 puffs into the lungs every 6 (six) hours as needed for wheezing or shortness of breath.     sertraline (ZOLOFT) 50 MG tablet Take 50 mg by mouth daily.      Labs  Lab Results:  Admission on 02/06/2023, Discharged on 02/07/2023  Component Date Value Ref Range Status   Sodium 02/06/2023 135  135 - 145 mmol/L Final   Potassium 02/06/2023 4.4  3.5 - 5.1 mmol/L Final   Chloride 02/06/2023 98  98 - 111 mmol/L Final   CO2  02/06/2023 27  22 - 32 mmol/L Final   Glucose, Bld 02/06/2023 81  70 - 99 mg/dL Final   Glucose reference range applies only to samples taken after fasting for at least 8 hours.   BUN 02/06/2023 20  6 - 20 mg/dL Final   Creatinine, Ser 02/06/2023 0.98  0.61 - 1.24 mg/dL Final   Calcium 96/29/5284 9.4  8.9 - 10.3 mg/dL Final   GFR, Estimated 02/06/2023 >60  >60 mL/min Final   Comment: (NOTE) Calculated using the CKD-EPI Creatinine Equation (2021)    Anion gap 02/06/2023 10  5 - 15 Final   Performed at [facility], [address], [place], [place] [address]   WBC 02/06/2023 6.4  4.0 -  10.5 K/uL Final   RBC 02/06/2023 4.49  4.22 - 5.81 MIL/uL Final   Hemoglobin 02/06/2023 13.1  13.0 - 17.0 g/dL Final   HCT 84/69/6295 39.2  39.0 - 52.0 % Final   MCV 02/06/2023 87.3  80.0 - 100.0 fL Final   MCH 02/06/2023 29.2  26.0 - 34.0 pg Final   MCHC 02/06/2023 33.4  30.0 - 36.0 g/dL Final   RDW 28/41/3244 12.0  11.5 - 15.5 % Final   Platelets 02/06/2023 262  150 - 400 K/uL Final   nRBC 02/06/2023 0.0  0.0 - 0.2 % Final   Performed at [facility], [address], [place], [place] [address]   Troponin I (High Sensitivity) 02/06/2023 <2  <18 ng/L Final   Comment: (NOTE) Elevated high sensitivity troponin I (hsTnI) values and significant  changes across serial measurements may suggest ACS but many other  chronic and acute conditions are known to elevate hsTnI results.  Refer to the "Links" section for chest pain algorithms and additional  guidance. Performed at [facility], [address], [place], [place] [address]    Opiates 02/06/2023 NONE DETECTED  NONE DETECTED Final   Cocaine 02/06/2023 NONE DETECTED  NONE DETECTED Final   Benzodiazepines 02/06/2023 NONE DETECTED  NONE DETECTED Final   Amphetamines 02/06/2023 NONE DETECTED  NONE DETECTED Final   Tetrahydrocannabinol 02/06/2023 NONE DETECTED  NONE DETECTED Final   Barbiturates 02/06/2023 NONE DETECTED  NONE DETECTED Final    Comment: (NOTE) DRUG SCREEN FOR MEDICAL PURPOSES ONLY.  IF CONFIRMATION IS NEEDED FOR ANY PURPOSE, NOTIFY LAB WITHIN 5 DAYS.  LOWEST DETECTABLE LIMITS FOR URINE DRUG SCREEN Drug Class                     Cutoff (ng/mL) Amphetamine and metabolites    1000 Barbiturate and metabolites    200 Benzodiazepine                 200 Opiates and metabolites        300 Cocaine and metabolites        300 THC                            50 Performed at [facility], [address] [place], [place] [address]    Specimen Description 02/06/2023 BLOOD LEFT ARM   Final   Special Requests 02/06/2023 BOTTLES DRAWN AEROBIC AND ANAEROBIC Blood Culture adequate volume   Final   Culture 02/06/2023    Final                   Value:NO GROWTH < 12 HOURS Performed at [facility], [address], [place], [place] [address]    Report Status 02/06/2023 PENDING   Incomplete   Specimen Description 02/06/2023 BLOOD RIGHT ANTECUBITAL   Final   Special Requests 02/06/2023 BOTTLES DRAWN AEROBIC AND ANAEROBIC Blood Culture results may not be optimal due to an excessive volume of blood received in culture bottles   Final   Culture 02/06/2023    Final                   Value:NO GROWTH < 12 HOURS Performed at [facility], [address], [place], [place] [address]    Report Status 02/06/2023 PENDING   Incomplete  Admission on 02/02/2023, Discharged on 02/06/2023  Component Date Value Ref Range Status   Hgb  A1c MFr Bld 02/03/2023 5.5  4.8 - 5.6 % Final   Comment: (NOTE) Pre diabetes:          5.7%-6.4%  Diabetes:              >6.4%  Glycemic control for   <7.0% adults with diabetes    Mean Plasma Glucose 02/03/2023 111.15  mg/dL Final   Performed at [facility], [address] [place], [place] [address]   Cholesterol 02/03/2023 157  0 - 200 mg/dL Final   Triglycerides 19/14/7829 75  <150 mg/dL Final   HDL 56/21/3086 45  >40 mg/dL Final   Total CHOL/HDL Ratio 02/03/2023 3.5  RATIO  Final   VLDL 02/03/2023 15  0 - 40 mg/dL Final   LDL Cholesterol 02/03/2023 97  0 - 99 mg/dL Final   Comment:        Total Cholesterol/HDL:CHD Risk Coronary Heart Disease Risk Table                     Men   Women  1/2 Average Risk   3.4   3.3  Average Risk       5.0   4.4  2 X Average Risk   9.6   7.1  3 X Average Risk  23.4   11.0        Use the calculated Patient Ratio above and the CHD Risk Table to determine the patient's CHD Risk.        ATP III CLASSIFICATION (LDL):  <100     mg/dL   Optimal  578-469  mg/dL   Near or Above                    Optimal  130-159  mg/dL   Borderline  629-528  mg/dL   High  >413     mg/dL   Very High Performed at [facility], [address], [place], [place] [address]    TSH 02/03/2023 0.333 (L)  0.350 - 4.500 uIU/mL Final   Comment: Performed by a 3rd Generation assay with a functional sensitivity of <=0.01 uIU/mL. Performed at [facility], [address] [place], [place] [address]    Free T4 02/04/2023 0.92  0.61 - 1.12 ng/dL Final   Comment: (NOTE) Biotin ingestion may interfere with free T4 tests. If the results are inconsistent with the TSH level, previous test results, or the clinical presentation, then consider biotin interference. If needed, order repeat testing after stopping biotin. Performed at [facility], [address], [place], [place] [address]   Admission on 02/02/2023, Discharged on 02/02/2023  Component Date Value Ref Range Status   HIV Screen 4th Generation wRfx 02/02/2023 Non Reactive  Non Reactive Final   Performed at [facility], [address], [place], [place] [address]   POC Amphetamine UR 02/02/2023 None Detected  NONE DETECTED (Cut Off Level 1000 ng/mL) Final   POC Secobarbital (BAR) 02/02/2023 None Detected  NONE DETECTED (Cut Off Level 300 ng/mL) Final   POC Buprenorphine (BUP) 02/02/2023 Positive (A)  NONE DETECTED (Cut Off Level 10 ng/mL) Final   POC Oxazepam (BZO) 02/02/2023  Positive (A)  NONE DETECTED (Cut Off Level 300 ng/mL) Final   POC Cocaine UR 02/02/2023 Positive (A)  NONE DETECTED (Cut Off Level 300 ng/mL) Final   POC Methamphetamine UR 02/02/2023 None Detected  NONE DETECTED (Cut Off Level 1000 ng/mL) Final   POC Morphine 02/02/2023  None Detected  NONE DETECTED (Cut Off Level 300 ng/mL) Final   POC Methadone UR 02/02/2023 None Detected  NONE DETECTED (Cut Off Level 300 ng/mL) Final   POC Oxycodone UR 02/02/2023 None Detected  NONE DETECTED (Cut Off Level 100 ng/mL) Final   POC Marijuana UR 02/02/2023 None Detected  NONE DETECTED (Cut Off Level 50 ng/mL) Final   TSH 02/02/2023 0.244 (L)  0.350 - 4.500 uIU/mL Final   Comment: Performed by a 3rd Generation assay with a functional sensitivity of <=0.01 uIU/mL. Performed at [facility], [address], [place], [place] [address]    Cholesterol 02/02/2023 177  0 - 200 mg/dL Final   Triglycerides 60/45/4098 88  <150 mg/dL Final   HDL 11/91/4782 58  >40 mg/dL Final   Total CHOL/HDL Ratio 02/02/2023 3.1  RATIO Final   VLDL 02/02/2023 18  0 - 40 mg/dL Final   LDL Cholesterol 02/02/2023 101 (H)  0 - 99 mg/dL Final   Comment:        Total Cholesterol/HDL:CHD Risk Coronary Heart Disease Risk Table                     Men   Women  1/2 Average Risk   3.4   3.3  Average Risk       5.0   4.4  2 X Average Risk   9.6   7.1  3 X Average Risk  23.4   11.0        Use the calculated Patient Ratio above and the CHD Risk Table to determine the patient's CHD Risk.        ATP III CLASSIFICATION (LDL):  <100     mg/dL   Optimal  956-213  mg/dL   Near or Above                    Optimal  130-159  mg/dL   Borderline  086-578  mg/dL   High  >469     mg/dL   Very High Performed at [facility], [address] [place], [place] [address]    HCV Quantitative 02/02/2023 HCV Not Detected  >50 IU/mL Final   Test Information 02/02/2023 Comment   Final   Comment: (NOTE) The quantitative range of this assay is 15  IU/mL to 100 million IU/mL. Performed At: [facility] [address] [place], [place] [id number] Jolene Schimke MD UU:[id number]    Hep B S AB Quant (Post) 02/02/2023 10.3  Immunity>10 mIU/mL Final   Comment: (NOTE)  Status of Immunity                     Anti-HBs Level  ------------------                     -------------- Inconsistent with Immunity                  0.0 - 10.0 Consistent with Immunity                         >10.0 Performed At: [facility] [address] [place], [place] [id number] Jolene Schimke MD OV:[id number]   Admission on 01/31/2023, Discharged on 01/31/2023  Component Date Value Ref Range Status   WBC 01/31/2023 6.4  4.0 - 10.5 K/uL Final   RBC 01/31/2023 4.43  4.22 - 5.81 MIL/uL Final   Hemoglobin 01/31/2023 13.1  13.0 - 17.0  g/dL Final   HCT 16/01/9603 38.7 (L)  39.0 - 52.0 % Final   MCV 01/31/2023 87.4  80.0 - 100.0 fL Final   MCH 01/31/2023 29.6  26.0 - 34.0 pg Final   MCHC 01/31/2023 33.9  30.0 - 36.0 g/dL Final   RDW 54/12/8117 12.4  11.5 - 15.5 % Final   Platelets 01/31/2023 311  150 - 400 K/uL Final   nRBC 01/31/2023 0.0  0.0 - 0.2 % Final   Neutrophils Relative % 01/31/2023 55  % Final   Neutro Abs 01/31/2023 3.5  1.7 - 7.7 K/uL Final   Lymphocytes Relative 01/31/2023 30  % Final   Lymphs Abs 01/31/2023 1.9  0.7 - 4.0 K/uL Final   Monocytes Relative 01/31/2023 12  % Final   Monocytes Absolute 01/31/2023 0.8  0.1 - 1.0 K/uL Final   Eosinophils Relative 01/31/2023 3  % Final   Eosinophils Absolute 01/31/2023 0.2  0.0 - 0.5 K/uL Final   Basophils Relative 01/31/2023 0  % Final   Basophils Absolute 01/31/2023 0.0  0.0 - 0.1 K/uL Final   Immature Granulocytes 01/31/2023 0  % Final   Abs Immature Granulocytes 01/31/2023 0.01  0.00 - 0.07 K/uL Final   Performed at [facility], [address], [place], [place] [address]   Sodium 01/31/2023 136  135 - 145 mmol/L Final   Potassium 01/31/2023 3.9  3.5 - 5.1 mmol/L Final   Chloride  01/31/2023 100  98 - 111 mmol/L Final   CO2 01/31/2023 28  22 - 32 mmol/L Final   Glucose, Bld 01/31/2023 83  70 - 99 mg/dL Final   Glucose reference range applies only to samples taken after fasting for at least 8 hours.   BUN 01/31/2023 13  6 - 20 mg/dL Final   Creatinine, Ser 01/31/2023 0.80  0.61 - 1.24 mg/dL Final   Calcium 95/62/1308 8.8 (L)  8.9 - 10.3 mg/dL Final   Total Protein 65/78/4696 6.6  6.5 - 8.1 g/dL Final   Albumin 29/52/8413 3.7  3.5 - 5.0 g/dL Final   AST 24/40/1027 19  15 - 41 U/L Final   ALT 01/31/2023 15  0 - 44 U/L Final   Alkaline Phosphatase 01/31/2023 75  38 - 126 U/L Final   Total Bilirubin 01/31/2023 0.5  0.3 - 1.2 mg/dL Final   GFR, Estimated 01/31/2023 >60  >60 mL/min Final   Comment: (NOTE) Calculated using the CKD-EPI Creatinine Equation (2021)    Anion gap 01/31/2023 8  5 - 15 Final   Performed at [facility], [address] [place], [place] [address]   Troponin I (High Sensitivity) 01/31/2023 8  <18 ng/L Final   Comment: (NOTE) Elevated high sensitivity troponin I (hsTnI) values and significant  changes across serial measurements may suggest ACS but many other  chronic and acute conditions are known to elevate hsTnI results.  Refer to the "Links" section for chest pain algorithms and additional  guidance. Performed at [facility], [address], [place], [place] [address]    Alcohol, Ethyl (B) 01/31/2023 <10  <10 mg/dL Final   Comment: (NOTE) Lowest detectable limit for serum alcohol is 10 mg/dL.  For medical purposes only. Performed at [facility], [address] [place], [place] [address]    Opiates 01/31/2023 NONE DETECTED  NONE DETECTED Final   Cocaine 01/31/2023 POSITIVE (A)  NONE DETECTED Final   Benzodiazepines 01/31/2023 POSITIVE (A)  NONE DETECTED Final   Amphetamines 01/31/2023 NONE DETECTED  NONE DETECTED Final  Tetrahydrocannabinol 01/31/2023 NONE DETECTED  NONE DETECTED Final   Barbiturates 01/31/2023 NONE DETECTED  NONE  DETECTED Final   Comment: (NOTE) DRUG SCREEN FOR MEDICAL PURPOSES ONLY.  IF CONFIRMATION IS NEEDED FOR ANY PURPOSE, NOTIFY LAB WITHIN 5 DAYS.  LOWEST DETECTABLE LIMITS FOR URINE DRUG SCREEN Drug Class                     Cutoff (ng/mL) Amphetamine and metabolites    1000 Barbiturate and metabolites    200 Benzodiazepine                 200 Opiates and metabolites        300 Cocaine and metabolites        300 THC                            50 Performed at [facility], [address] [place], [place] [address]    Troponin I (High Sensitivity) 01/31/2023 5  <18 ng/L Final   Comment: (NOTE) Elevated high sensitivity troponin I (hsTnI) values and significant  changes across serial measurements may suggest ACS but many other  chronic and acute conditions are known to elevate hsTnI results.  Refer to the "Links" section for chest pain algorithms and additional  guidance. Performed at [facility], [address] [place], [place] [address]   Admission on 01/30/2023, Discharged on 01/31/2023  Component Date Value Ref Range Status   Glucose-Capillary 01/30/2023 103 (H)  70 - 99 mg/dL Final   Glucose reference range applies only to samples taken after fasting for at least 8 hours.   WBC 01/30/2023 12.2 (H)  4.0 - 10.5 K/uL Final   RBC 01/30/2023 4.56  4.22 - 5.81 MIL/uL Final   Hemoglobin 01/30/2023 13.3  13.0 - 17.0 g/dL Final   HCT 84/13/2440 39.6  39.0 - 52.0 % Final   MCV 01/30/2023 86.8  80.0 - 100.0 fL Final   MCH 01/30/2023 29.2  26.0 - 34.0 pg Final   MCHC 01/30/2023 33.6  30.0 - 36.0 g/dL Final   RDW 02/12/2535 12.3  11.5 - 15.5 % Final   Platelets 01/30/2023 344  150 - 400 K/uL Final   nRBC 01/30/2023 0.0  0.0 - 0.2 % Final   Neutrophils Relative % 01/30/2023 80  % Final   Neutro Abs 01/30/2023 9.7 (H)  1.7 - 7.7 K/uL Final   Lymphocytes Relative 01/30/2023 11  % Final   Lymphs Abs 01/30/2023 1.4  0.7 - 4.0 K/uL Final   Monocytes Relative 01/30/2023 8  % Final    Monocytes Absolute 01/30/2023 1.0  0.1 - 1.0 K/uL Final   Eosinophils Relative 01/30/2023 1  % Final   Eosinophils Absolute 01/30/2023 0.1  0.0 - 0.5 K/uL Final   Basophils Relative 01/30/2023 0  % Final   Basophils Absolute 01/30/2023 0.0  0.0 - 0.1 K/uL Final   Immature Granulocytes 01/30/2023 0  % Final   Abs Immature Granulocytes 01/30/2023 0.04  0.00 - 0.07 K/uL Final   Performed at [facility], [address] [place], [place] [address]   Sodium 01/30/2023 135  135 - 145 mmol/L Final   Potassium 01/30/2023 4.3  3.5 - 5.1 mmol/L Final   Chloride 01/30/2023 100  98 - 111 mmol/L Final   CO2 01/30/2023 27  22 - 32 mmol/L Final   Glucose, Bld 01/30/2023 100 (H)  70 - 99 mg/dL Final   Glucose reference  range applies only to samples taken after fasting for at least 8 hours.   BUN 01/30/2023 16  6 - 20 mg/dL Final   Creatinine, Ser 01/30/2023 0.83  0.61 - 1.24 mg/dL Final   Calcium 16/01/9603 8.9  8.9 - 10.3 mg/dL Final   Total Protein 54/12/8117 7.0  6.5 - 8.1 g/dL Final   Albumin 14/78/2956 4.1  3.5 - 5.0 g/dL Final   AST 21/30/8657 21  15 - 41 U/L Final   ALT 01/30/2023 14  0 - 44 U/L Final   Alkaline Phosphatase 01/30/2023 79  38 - 126 U/L Final   Total Bilirubin 01/30/2023 0.4  0.3 - 1.2 mg/dL Final   GFR, Estimated 01/30/2023 >60  >60 mL/min Final   Comment: (NOTE) Calculated using the CKD-EPI Creatinine Equation (2021)    Anion gap 01/30/2023 8  5 - 15 Final   Performed at [facility], [address] [place], [place] [address]   Opiates 01/30/2023 NONE DETECTED  NONE DETECTED Final   Cocaine 01/30/2023 POSITIVE (A)  NONE DETECTED Final   Benzodiazepines 01/30/2023 POSITIVE (A)  NONE DETECTED Final   Amphetamines 01/30/2023 NONE DETECTED  NONE DETECTED Final   Tetrahydrocannabinol 01/30/2023 NONE DETECTED  NONE DETECTED Final   Barbiturates 01/30/2023 NONE DETECTED  NONE DETECTED Final   Comment: (NOTE) DRUG SCREEN FOR MEDICAL PURPOSES ONLY.  IF CONFIRMATION IS NEEDED FOR  ANY PURPOSE, NOTIFY LAB WITHIN 5 DAYS.  LOWEST DETECTABLE LIMITS FOR URINE DRUG SCREEN Drug Class                     Cutoff (ng/mL) Amphetamine and metabolites    1000 Barbiturate and metabolites    200 Benzodiazepine                 200 Opiates and metabolites        300 Cocaine and metabolites        300 THC                            50 Performed at [facility], [address], [place], [place] [address]    Troponin I (High Sensitivity) 01/30/2023 <2  <18 ng/L Final   Comment: (NOTE) Elevated high sensitivity troponin I (hsTnI) values and significant  changes across serial measurements may suggest ACS but many other  chronic and acute conditions are known to elevate hsTnI results.  Refer to the "Links" section for chest pain algorithms and additional  guidance. Performed at [facility], [address], [place], [place] [address]    Troponin I (High Sensitivity) 01/30/2023 <2  <18 ng/L Final   Comment: (NOTE) Elevated high sensitivity troponin I (hsTnI) values and significant  changes across serial measurements may suggest ACS but many other  chronic and acute conditions are known to elevate hsTnI results.  Refer to the "Links" section for chest pain algorithms and additional  guidance. Performed at [facility], [address], [place], [place] [address]   Admission on 01/05/2023, Discharged on 01/05/2023  Component Date Value Ref Range Status   Sodium 01/05/2023 138  135 - 145 mmol/L Final   Potassium 01/05/2023 4.2  3.5 - 5.1 mmol/L Final   Chloride 01/05/2023 101  98 - 111 mmol/L Final   CO2 01/05/2023 26  22 - 32 mmol/L Final   Glucose, Bld 01/05/2023 115 (H)  70 - 99 mg/dL Final   Glucose reference range applies only to samples taken  after fasting for at least 8 hours.   BUN 01/05/2023 13  6 - 20 mg/dL Final   Creatinine, Ser 01/05/2023 0.91  0.61 - 1.24 mg/dL Final   Calcium 56/38/7564 10.0  8.9 - 10.3 mg/dL Final   Total Protein 33/29/5188 7.7  6.5 - 8.1 g/dL  Final   Albumin 41/66/0630 4.5  3.5 - 5.0 g/dL Final   AST 16/04/930 20  15 - 41 U/L Final   ALT 01/05/2023 16  0 - 44 U/L Final   Alkaline Phosphatase 01/05/2023 79  38 - 126 U/L Final   Total Bilirubin 01/05/2023 0.9  0.3 - 1.2 mg/dL Final   GFR, Estimated 01/05/2023 >60  >60 mL/min Final   Comment: (NOTE) Calculated using the CKD-EPI Creatinine Equation (2021)    Anion gap 01/05/2023 11  5 - 15 Final   Performed at [facility], [address] [place], [place] [address]   Alcohol, Ethyl (B) 01/05/2023 <10  <10 mg/dL Final   Comment: (NOTE) Lowest detectable limit for serum alcohol is 10 mg/dL.  For medical purposes only. Performed at [facility], [address] [place], [place] [address]    WBC 01/05/2023 8.7  4.0 - 10.5 K/uL Final   RBC 01/05/2023 5.23  4.22 - 5.81 MIL/uL Final   Hemoglobin 01/05/2023 15.4  13.0 - 17.0 g/dL Final   HCT 42/70/6237 46.2  39.0 - 52.0 % Final   MCV 01/05/2023 88.3  80.0 - 100.0 fL Final   MCH 01/05/2023 29.4  26.0 - 34.0 pg Final   MCHC 01/05/2023 33.3  30.0 - 36.0 g/dL Final   RDW 62/83/1517 12.7  11.5 - 15.5 % Final   Platelets 01/05/2023 334  150 - 400 K/uL Final   nRBC 01/05/2023 0.0  0.0 - 0.2 % Final   Performed at [facility], [address] [place], [place] [address]   Opiates 01/05/2023 NONE DETECTED  NONE DETECTED Final   Cocaine 01/05/2023 POSITIVE (A)  NONE DETECTED Final   Benzodiazepines 01/05/2023 NONE DETECTED  NONE DETECTED Final   Amphetamines 01/05/2023 POSITIVE (A)  NONE DETECTED Final   Tetrahydrocannabinol 01/05/2023 POSITIVE (A)  NONE DETECTED Final   Barbiturates 01/05/2023 NONE DETECTED  NONE DETECTED Final   Comment: (NOTE) DRUG SCREEN FOR MEDICAL PURPOSES ONLY.  IF CONFIRMATION IS NEEDED FOR ANY PURPOSE, NOTIFY LAB WITHIN 5 DAYS.  LOWEST DETECTABLE LIMITS FOR URINE DRUG SCREEN Drug Class                     Cutoff (ng/mL) Amphetamine and metabolites    1000 Barbiturate and metabolites    200 Benzodiazepine                  200 Opiates and metabolites        300 Cocaine and metabolites        300 THC                            50 Performed at [facility], [address] [place], [place] [address]     Blood Alcohol level:  Lab Results  Component Value Date   [facility] <10 01/31/2023   ETH <10 01/05/2023    Metabolic Disorder Labs: Lab Results  Component Value Date   HGBA1C 5.5 02/03/2023   MPG 111.15 02/03/2023   No results found for: "PROLACTIN" Lab Results  Component Value Date   CHOL 157 02/03/2023   TRIG 75 02/03/2023  HDL 45 02/03/2023   CHOLHDL 3.5 02/03/2023   VLDL 15 02/03/2023   LDLCALC 97 02/03/2023   LDLCALC 101 (H) 02/02/2023    Therapeutic Lab Levels: No results found for: "LITHIUM" No results found for: "VALPROATE" No results found for: "CBMZ"  Physical Findings   AUDIT    Flowsheet Row ED from 02/02/2023 in [facility]  Alcohol Use Disorder Identification Test Final Score (AUDIT) 34      PHQ2-9    Flowsheet Row ED from 02/07/2023 in [facility] ED from 02/02/2023 in [facility] Office Visit from 01/04/2022 in [facility]  PHQ-2 Total Score 6 2 0  PHQ-9 Total Score 24 15 --      Flowsheet Row ED from 02/07/2023 in [facility] ED from 02/06/2023 in [facility] Emergency Department at [facility] ED from 02/02/2023 in [facility]  C-SSRS RISK CATEGORY No Risk No Risk No Risk        Musculoskeletal  Strength & Muscle Tone: within normal limits Gait & Station: normal Patient leans: N/A  Psychiatric Specialty Exam  Presentation  General Appearance:  Appropriate for Environment  Eye Contact: Good  Speech: Clear and Coherent; Normal Rate  Speech Volume: Normal  Handedness: -- (not assessed)   Mood and Affect  Mood: -- ("Not going to lie, I am feeling  down")  Affect: Congruent; Full Range   Thought Process  Thought Processes: Linear  Descriptions of Associations:Intact  Orientation:-- (grossly intact)  Thought Content:Logical  Diagnosis of Schizophrenia or Schizoaffective disorder in past: No    Hallucinations:Hallucinations: None   Ideas of Reference:None  Suicidal Thoughts:Suicidal Thoughts: No   Homicidal Thoughts:Homicidal Thoughts: No    Sensorium  Memory: Immediate Good; Recent Good; Remote Good  Judgment: Fair  Insight: Fair   Chartered certified accountant: Fair  Attention Span: Fair  Recall: [employer] of Knowledge: Fair  Language: Fair   Psychomotor Activity  Psychomotor Activity: Psychomotor Activity: Normal    Assets  Assets: Desire for Improvement; Resilience; Communication Skills   Sleep  Sleep: Sleep: Poor    Nutritional Assessment (For OBS and FBC admissions only) Has the patient had a weight loss or gain of 10 pounds or more in the last 3 months?: No Has the patient had a decrease in food intake/or appetite?: No Does the patient have dental problems?: No Does the patient have eating habits or behaviors that may be indicators of an eating disorder including binging or inducing vomiting?: No Has the patient recently lost weight without trying?: 0 Has the patient been eating poorly because of a decreased appetite?: 0 Malnutrition Screening Tool Score: 0     Physical Exam  Physical Exam Vitals and nursing note reviewed.  Constitutional:      General: He is not in acute distress.    Appearance: He is not ill-appearing.  HENT:     Head: Normocephalic and atraumatic.  Pulmonary:     Effort: Pulmonary effort is normal. No respiratory distress.  Skin:    General: Skin is warm and dry.  Neurological:     General: No focal deficit present.     Mental Status: He is alert and oriented to person, place, and time.     Cranial Nerves: No cranial nerve deficit  or facial asymmetry.     Sensory: No sensory deficit.     Motor: No weakness.  Coordination: Coordination normal.     Gait: Gait normal.    Review of Systems  Constitutional:  Negative for malaise/fatigue.  Respiratory:  Negative for shortness of breath.   Cardiovascular:  Negative for chest pain.  Gastrointestinal:  Negative for nausea and vomiting.  Musculoskeletal:  Negative for myalgias.  Neurological:  Positive for dizziness and weakness.       Some lightheadedness and improved dizziness.    Blood pressure (!) 131/91, pulse 78, temperature 98.1 F (36.7 C), resp. rate 18, SpO2 97%. There is no height or weight on file to calculate BMI.  Treatment Plan Summary: Nahun Cyphert is a 37 yo male with a PPHx of opioid use disorder, stimulant use disorder, and tobacco use disorder, admitted to [facility] on 10/17 for detox from recent fentanyl and cocaine use with interest in Residential Rehabilitation.  Patient reports improvements to dizziness today after discontinuing Seroquel last night.  As mentioned in HPI, will keep an eye out for hypotensive BPs with Prazosin and Trazodone. May need to adjust dose of the latter in the future, particularly if patient symptomatic. Will increase Zoloft for PTSD related anxiety symptoms; as well, he was reassured that some degree of anxiety and sadness are normal emotions.     Medical Decision Making  Psychiatric Diagnoses: Substance induced mood disorder  PTSD Opioid Use Disorder Stimulant Use Disorder Tobacco use disorder Alcohol use disorder     Psychiatric Diagnoses and Treatment:  Opioid Use Disorder  Subutex 2 mg BID dosing, with plan to transition to suboxone (8-2 mg) at discharge PRNs Tylenol 650 mg every 6 hours as needed for mild pain Naproxen 500 mg BID as needed for pain Bentyl 20 mg every 6 hours as needed for spasms/abdominal cramping Robaxin 500 mg every 8 hours as needed for muscle spasms Zofran 4 mg every 6 hours as needed for  nausea or vomiting Imodium 2 to 4 mg as needed for diarrhea or loose stools Maalox/Mylanta 30 mL every 4 hours as needed for indigestion Milk of Mag 30 mL as needed for constipation    Alcohol Use Disorder  Thiamine 100 mg daily  Substance Induced mood disorder PTSD Discontinue Zoloft Continue Prazosin 1 mg at bedtime, started 10/19. Restarted Seroquel 100 mg for mood stabilization Start Depakote ER 500 mg nightly (patient received an initial dose of Depakote DR 500 mg this afternoon, this order was discontinued to avoid excessive daytime sedation.) Will need AM trough VPA level, 10/24  A1C 5.5% Lipid WNL Continue Trazodone 50 mg at bedtime and x 1 PRN for insomnia   Tobacco Use Disorder Encouraged smoking cessation Nicotine patch 21 mg daily Nicorette gum PRN   Medical Issues Being Addressed:   02/03/2023:  Unwitnessed fall reported by patient on 10/18 in the afternoon. Neurological exam is unremarkable, no hematoma or redness in the occipital region of head. Do not believe patient requiredCT imaging at this time.   #Subclinical Hyperthyroidism; likely substance-related  TSH (0.333 mIU/L) on 02/03/2023 Free T3 WNL Free T4 WNL TSI pending  Other Labs/Imaging Reviewed: Hepatitis panel 10.3, showing immunity HIV - nonreactive CMP WNL CBC unremarkable   EKG on 02/02/2023: QTc 374     Disposition: [facility] on 02/08/2023   Signed: Lorri Frederick, MD PGY-2 02/07/2023 12:15 PM

## 2023-02-07 NOTE — Progress Notes (Addendum)
Pt is awake, alert and oriented X3 though intermittently exhibits mild confusion. Pt did not voice any complaints of pain or discomfort. No signs of acute distress noted. Pt currently ambulates with a walker due to unsteady gaits. Pt was educated on how to safely use the walker  to prevent falls and he was receptive. Administered scheduled meds per order. Pt denies current SI/HI/AVH, plan or intent. Pt advised to notify staff when having thoughts to hurt self and he was receptive. Staff will monitor for pt's safety.

## 2023-02-07 NOTE — ED Notes (Signed)
Patient is sleeping. Respirations equal and unlabored, skin warm and dry, NAD. No change in assessment or acuity. Routine safety checks conducted according to facility protocol. Will continue to monitor for safety.   

## 2023-02-07 NOTE — Discharge Planning (Signed)
LCSW followed up with [facility] Admissions Coordinator [place] who reports patient would be a good candidate for their treatment facility. Per [place], transportation can be arranged via [facility] for the patient to arrive on tomorrow 02/08/2023. LCSW spoke with patient to confirm if he is in agreement with plan. Patient is agreeable to plan and expressed appreciation for LCSW assistance. No other needs were reported by patient or facility. 30 day script needed and medications can be filled at their facility.   Patient asked if he could be kept a few more days before being discharged. Team informed patient that his detox is complete and he is stable to discharge. Patient was advised to continue walking without walker on today as no facility will accept him with an assistive device. Patient has been observed walking with no issues on otday. Patient is aware that if he attempts to sabotage disposition plan in order to be kept longer at [facility], he will be discharged on tomorrow. No other needs to report. Patient to provide update to his family.   LCSW will be informed on tomorrow of transport time.   Fernande Boyden, LCSW Clinical Social Worker [place] [facility] Ph: [phone]

## 2023-02-07 NOTE — Group Note (Signed)
Group Topic: Positive Affirmations  Group Date: 02/07/2023 Start Time: 2030 End Time: 2100 Facilitators: Guss Bunde  Department: [facility]  Number of Participants: 2  Group Focus: check in Treatment Modality:  Psychoeducation Interventions utilized were support Purpose: reinforce self-care  Name: Eric Lowery Date of Birth: 1985-04-30  MR: [id number]    Level of Participation: active Quality of Participation: attentive Interactions with others: gave feedback Mood/Affect: appropriate Triggers (if applicable):  Cognition: goal directed Progress: Gaining insight Response: Pt wants to see his family before entering long term treatment Plan: patient will be encouraged to follow up on goals  Patients Problems:  Patient Active Problem List   Diagnosis Date Noted   Substance abuse (HCC) 02/07/2023   Opioid use disorder, severe, dependence (HCC) 02/02/2023   On high dose antipsychotic drug therapy 02/02/2023

## 2023-02-07 NOTE — ED Notes (Signed)
Spoke to [employer], charge nurse, pt is good to go back to [facility], misunderstanding has been resolved, pt to go back to [facility] via safe transport

## 2023-02-07 NOTE — ED Notes (Signed)
A ED nurse called to give report on pt approximately 11pm. The ED RN re[ported that pt has been medically cleared and all of his CT and MRI has come back to normal. Writer asked ED RN if pt is steady in gait and able to ambulate without assisted, ED RN sounded wasn't sure about it. Writer requested ED RN to check that up and let writer know especially since that was the main reason pt was went to the ED. ED RN stated alright and hanged up. Writer was here expecting a call from the ED RN. Pt room was opened and ready for pt.waiting on the call from ED. Nurse on Charge notified.

## 2023-02-07 NOTE — ED Notes (Addendum)
Pt returned from [facility]  to [facility] in a wheel chair upon being medically cleared. RN did not receive report from the ED RN. Pt  in [facility] with a walker. Admission process is completed. Upon assessment, pt was asked if he has vertigo and he said, he has never thought about that, but to think of it, his mother had it and was assisted whenever she walks, so it could be. Pt was advised to call for assistance whenever he needs it. Pt is provided with meal. Pt denies SI/HI/AVH. Will continue to monitor for safety and provide support.

## 2023-02-07 NOTE — Progress Notes (Deleted)
Pt was visible in the milieu and has been appropriate this shift. No distress noted or concerns voiced. Staff will monitor for pt's safety.

## 2023-02-07 NOTE — ED Provider Notes (Signed)
Pt medically cleared by prior team Plan to transfer back to [facility]    Sloan Leiter, DO 02/07/23 0200

## 2023-02-07 NOTE — Group Note (Signed)
Group Topic: Balance in Life  Group Date: 02/07/2023 Start Time: 1130 End Time: 1215 Facilitators: Cassandria Anger  Department: [facility]  Number of Participants: 3  Group Focus: goals/reality orientation Treatment Modality:  Psychoeducation Interventions utilized were patient education Purpose: express feelings  Name: Eric Lowery Date of Birth: 1985-10-10  MR: [id number]    Level of Participation: Patient did not attend. Patient was with provider Quality of Participation: Patient did not attend. Patient was with provider Interactions with others: N/A Mood/Affect: N/A Triggers (if applicable): N/A Cognition: N/A Progress: None Response: N/A Plan: follow-up needed  Patients Problems:  Patient Active Problem List   Diagnosis Date Noted   Substance abuse (HCC) 02/07/2023   Opioid use disorder, severe, dependence (HCC) 02/02/2023   On high dose antipsychotic drug therapy 02/02/2023

## 2023-02-07 NOTE — ED Notes (Signed)
 Patient was provided dinner

## 2023-02-07 NOTE — ED Notes (Addendum)
Attempted to call [facility] to give report,Reported to receiving RN that pt has been medically cleared and all of his CT and MRI has come back to normal. RN stated that pt can't return unless he is able to ambulate, this RN did a rode test on the pt and pt was unable to walk with a steady gait, ED charge nurse notified, EDP notified.

## 2023-02-07 NOTE — ED Notes (Signed)
Pt is in the dayroom watching TV with peers. Pt denies SI/HI/AVH. No acute distress noted. Will continue to monitor for safety. 

## 2023-02-07 NOTE — ED Notes (Signed)
Patient was provided breakfast

## 2023-02-07 NOTE — Progress Notes (Signed)
Pt was visible in the milieu and is currently ambulating without a walker. Gait slow and a little unsteady. Complained of anxiety earlier and PRN Hydroxyzine was administered per order. No distress noted. Staff will monitor for pt's safety.

## 2023-02-07 NOTE — ED Notes (Signed)
Safe transport called 

## 2023-02-07 NOTE — Discharge Instructions (Signed)
Patient has been accepted to [facility] for further treatment and can admit to their facility on 02/08/2023 with transportation provided by [facility]. Address: [address] [place], [place] [address]; Number to call in case of emergency: [phone].   [facility] [address], [place], [address] [phone] phone  New Patient Assessment/Therapy Walk-Ins:  Monday and Wednesday: 8 am until slots are full. Every 1st and 2nd Fridays of the month: 1 pm - 5 pm.  NO ASSESSMENT/THERAPY WALK-INS ON TUESDAYS OR THURSDAYS  New Patient Assessment/Medication Management Walk-Ins:  Monday - Friday:  8 am - 11 am.  For all walk-ins, we ask that you arrive by 7:30 am because patients will be seen in the order of arrival.  Availability is limited; therefore, you may not be seen on the same day that you walk-in.  Our goal is to serve and meet the needs of our community to the best of our ability.  HALFWAY HOUSES:  [employer] [phone]  [employer].oxfordvacancies.com  12 STEP PROGRAMS:  Alcoholics Anonymous of [place] SoftwareChalet.be  Narcotics Anonymous of [place] [employer]ct.dk  Al-Anon of [place], [place] www.[place]alanon.org/find-meetings.html  Nar-Anon https://nar-anon.org/find-a-meetin

## 2023-02-07 NOTE — Discharge Planning (Signed)
LCSW was stopped by patient on this morning.  Patient reports he wanted to know updates regarding discharge planning.  Patient reports his goal is to go to a facility that will allow him to work.  LCSW discussed with the patient the barrier of ambulation with a walker. Patient reports understanding and reports he is trying his best to gain more strength in order to walk independently without an assistive device.  Patient was made aware of the treatment team's current recommendation for residential placement.  Patient reports being in agreement with the recommendation.  Patient asked what his options are at this time.  LCSW provided patient with a list of facilities that would like to complete a phone screening with him on today.  Patient reports he looks forward to following up with these facilities when able.  Patient also aware that treatment team will speak with him on this morning regarding disposition plans.  No other needs were reported from patient at this time.  LCSW will follow up to provide updates as received.  Fernande Boyden, LCSW Clinical Social Worker [address] [facility] Ph: [phone]

## 2023-02-07 NOTE — ED Provider Notes (Signed)
Facility Based Crisis Admission H&P  Date: 02/07/23 Patient Name: Eric Lowery MRN: [id number] Chief Complaint : "Polysubstance abuse"  Diagnoses:  Final diagnoses:  Substance abuse (HCC)  Polysubstance abuse (HCC)  Opioid use disorder    HPI: Eric Lowery is a 37 yo male with a PPHx of opioid use disorder, stimulant use disorder, and tobacco use disorder, initially admitted to [facility] on 10/17 for detox from recent fentanyl and cocaine use with interest in [facility]. hx of IV drug use.  Patient is presenting back to the [facility] as a direct transfer from [facility] after he was evaluated and medically cleared due to onset of worsening confusion, difficulty ambulating, and disorganized thinking. He had initially reported an unwitnessed fall on Friday without head trauma and normal neurological exam.   Patient was seen face to face by this provider and chart reviewed.   Per EDP note: Endorsing chest pain, dizziness for 4 days, history of seizures, reports "cannot walk", objective left arm and leg weakness on exam, reproducible on exam. Profound dysmetria on exam. Reports some subjective right side face abnormalities to sensation.  Imaging including MR brain, C-spine which shows no evidence of acute intracranial abnormality or injury of cervical spine. I agree with the radiologist interpretation .If he continues to have weakness, dizziness he can follow-up with neurology once psychiatrically evaluated.   Patient reports " I haven't been able to walk, they say it's something neurological going on with my legs, they told me to keep massaging my legs and call for assistance if I need to walk, but I'm back to continue my detox treatment, and I was here for 3 days before I was sent to the hospital to get checked out.   On evaluation, patient is alert, oriented x 3, and cooperative. Speech is clear, normal rate and coherent. Pt appears casually dressed. Eye contact is good. Mood is anxious and  depressed, affect is tearful and congruent with mood. Thought process is coherent and thought content is WDL. Pt denies SI/HI/AVH. There is no objective indication that the patient is responding to internal stimuli. No delusions elicited during this assessment.   Patient completed there PHQ-9 questionnaire and obtained a total score of 24, indicating severe depression. He is already on treatment with medication.     PHQ 2-9:  Flowsheet Row ED from 02/07/2023 in [facility] ED from 02/02/2023 in [facility]  Thoughts that you would be better off dead, or of hurting yourself in some way Not at all More than half the days  PHQ-9 Total Score 24 15       Flowsheet Row ED from 02/06/2023 in [facility] Emergency Department at [facility] Most recent reading at 02/06/2023 10:16 AM ED from 02/02/2023 in [facility] Most recent reading at 02/02/2023  2:13 PM ED from 02/02/2023 in [facility] Most recent reading at 02/02/2023 11:41 AM  C-SSRS RISK CATEGORY No Risk No Risk No Risk         Total Time spent with patient: 20 minutes  Musculoskeletal  Strength & Muscle Tone: decreased Gait & Station: unable to stand Patient leans:  sitting in a wheelchair  Psychiatric Specialty Exam  Presentation General Appearance:  Appropriate for Environment  Eye Contact: Good  Speech: Clear and Coherent  Speech Volume: Normal  Handedness: Right   Mood and Affect  Mood: Depressed  Affect: Congruent; Tearful   Thought Process  Thought Processes: Coherent  Descriptions of Associations:Intact  Orientation:Full (Time, Place and Person)  Thought Content:WDL  Diagnosis of Schizophrenia or Schizoaffective disorder in past: No   Hallucinations:Hallucinations: None  Ideas of Reference:None  Suicidal Thoughts:Suicidal Thoughts: No  Homicidal Thoughts:Homicidal  Thoughts: No   Sensorium  Memory: Immediate Fair  Judgment: Intact  Insight: Fair   Art therapist  Concentration: Good  Attention Span: Good  Recall: Good  Fund of Knowledge: Good  Language: Good   Psychomotor Activity  Psychomotor Activity: Psychomotor Activity: Decreased   Assets  Assets: Communication Skills; Desire for Improvement   Sleep  Sleep: Sleep: Good   Nutritional Assessment (For OBS and FBC admissions only) Has the patient had a weight loss or gain of 10 pounds or more in the last 3 months?: No Has the patient had a decrease in food intake/or appetite?: No Does the patient have dental problems?: No Does the patient have eating habits or behaviors that may be indicators of an eating disorder including binging or inducing vomiting?: No Has the patient recently lost weight without trying?: 0 Has the patient been eating poorly because of a decreased appetite?: 0 Malnutrition Screening Tool Score: 0    Physical Exam Constitutional:      General: He is not in acute distress.    Appearance: He is not diaphoretic.  HENT:     Head: Normocephalic.     Right Ear: External ear normal.     Left Ear: External ear normal.     Nose: No congestion.  Eyes:     General:        Right eye: No discharge.        Left eye: No discharge.  Pulmonary:     Effort: No respiratory distress.  Chest:     Chest wall: No tenderness.  Neurological:     Mental Status: He is alert and oriented to person, place, and time.  Psychiatric:        Attention and Perception: Attention and perception normal.        Mood and Affect: Mood is anxious and depressed. Affect is tearful.        Speech: Speech normal.        Behavior: Behavior is cooperative.        Thought Content: Thought content normal.        Cognition and Memory: Cognition and memory normal.    Review of Systems  Constitutional:  Negative for chills, diaphoresis and fever.  HENT:  Negative for  congestion.   Eyes:  Negative for discharge.  Respiratory:  Negative for cough, shortness of breath and wheezing.   Cardiovascular:  Negative for chest pain and palpitations.  Gastrointestinal:  Negative for diarrhea, nausea and vomiting.  Neurological:  Positive for weakness.  Psychiatric/Behavioral:  Positive for depression and substance abuse. The patient is nervous/anxious.     Past Psychiatric History: See H & P   Is the patient at risk to self? Yes  Has the patient been a risk to self in the past 6 months? Yes .    Has the patient been a risk to self within the distant past? Yes   Is the patient a risk to others? No   Has the patient been a risk to others in the past 6 months? No   Has the patient been a risk to others within the distant past? No   Past Medical History: See Chart Family History: N/A Social History: N/A  Last Labs:  Admission on 02/06/2023, Discharged on  02/07/2023  Component Date Value Ref Range Status   Sodium 02/06/2023 135  135 - 145 mmol/L Final   Potassium 02/06/2023 4.4  3.5 - 5.1 mmol/L Final   Chloride 02/06/2023 98  98 - 111 mmol/L Final   CO2 02/06/2023 27  22 - 32 mmol/L Final   Glucose, Bld 02/06/2023 81  70 - 99 mg/dL Final   Glucose reference range applies only to samples taken after fasting for at least 8 hours.   BUN 02/06/2023 20  6 - 20 mg/dL Final   Creatinine, Ser 02/06/2023 0.98  0.61 - 1.24 mg/dL Final   Calcium 47/82/9562 9.4  8.9 - 10.3 mg/dL Final   GFR, Estimated 02/06/2023 >60  >60 mL/min Final   Comment: (NOTE) Calculated using the CKD-EPI Creatinine Equation (2021)    Anion gap 02/06/2023 10  5 - 15 Final   Performed at [facility], [address], [place], [place] [address]   WBC 02/06/2023 6.4  4.0 - 10.5 K/uL Final   RBC 02/06/2023 4.49  4.22 - 5.81 MIL/uL Final   Hemoglobin 02/06/2023 13.1  13.0 - 17.0 g/dL Final   HCT 57/84/6962 39.2  39.0 - 52.0 % Final   MCV 02/06/2023 87.3  80.0 - 100.0 fL Final   MCH  02/06/2023 29.2  26.0 - 34.0 pg Final   MCHC 02/06/2023 33.4  30.0 - 36.0 g/dL Final   RDW 95/28/4132 12.0  11.5 - 15.5 % Final   Platelets 02/06/2023 262  150 - 400 K/uL Final   nRBC 02/06/2023 0.0  0.0 - 0.2 % Final   Performed at [facility], [address] [place], [place] [address]   Troponin I (High Sensitivity) 02/06/2023 <2  <18 ng/L Final   Comment: (NOTE) Elevated high sensitivity troponin I (hsTnI) values and significant  changes across serial measurements may suggest ACS but many other  chronic and acute conditions are known to elevate hsTnI results.  Refer to the "Links" section for chest pain algorithms and additional  guidance. Performed at [facility], [address], [place], [place] [address]    Opiates 02/06/2023 NONE DETECTED  NONE DETECTED Final   Cocaine 02/06/2023 NONE DETECTED  NONE DETECTED Final   Benzodiazepines 02/06/2023 NONE DETECTED  NONE DETECTED Final   Amphetamines 02/06/2023 NONE DETECTED  NONE DETECTED Final   Tetrahydrocannabinol 02/06/2023 NONE DETECTED  NONE DETECTED Final   Barbiturates 02/06/2023 NONE DETECTED  NONE DETECTED Final   Comment: (NOTE) DRUG SCREEN FOR MEDICAL PURPOSES ONLY.  IF CONFIRMATION IS NEEDED FOR ANY PURPOSE, NOTIFY LAB WITHIN 5 DAYS.  LOWEST DETECTABLE LIMITS FOR URINE DRUG SCREEN Drug Class                     Cutoff (ng/mL) Amphetamine and metabolites    1000 Barbiturate and metabolites    200 Benzodiazepine                 200 Opiates and metabolites        300 Cocaine and metabolites        300 THC                            50 Performed at [facility], [address] [place], [place] [address]   Admission on 02/02/2023, Discharged on 02/06/2023  Component Date Value Ref Range Status   Hgb A1c MFr Bld 02/03/2023 5.5  4.8 -  5.6 % Final   Comment: (NOTE) Pre diabetes:          5.7%-6.4%  Diabetes:              >6.4%  Glycemic control for   <7.0% adults with diabetes    Mean  Plasma Glucose 02/03/2023 111.15  mg/dL Final   Performed at [facility], [address], [place], [place] [address]   Cholesterol 02/03/2023 157  0 - 200 mg/dL Final   Triglycerides 13/11/6576 75  <150 mg/dL Final   HDL 46/96/2952 45  >40 mg/dL Final   Total CHOL/HDL Ratio 02/03/2023 3.5  RATIO Final   VLDL 02/03/2023 15  0 - 40 mg/dL Final   LDL Cholesterol 02/03/2023 97  0 - 99 mg/dL Final   Comment:        Total Cholesterol/HDL:CHD Risk Coronary Heart Disease Risk Table                     Men   Women  1/2 Average Risk   3.4   3.3  Average Risk       5.0   4.4  2 X Average Risk   9.6   7.1  3 X Average Risk  23.4   11.0        Use the calculated Patient Ratio above and the CHD Risk Table to determine the patient's CHD Risk.        ATP III CLASSIFICATION (LDL):  <100     mg/dL   Optimal  841-324  mg/dL   Near or Above                    Optimal  130-159  mg/dL   Borderline  401-027  mg/dL   High  >253     mg/dL   Very High Performed at [facility], [address], [place], [place] [address]    TSH 02/03/2023 0.333 (L)  0.350 - 4.500 uIU/mL Final   Comment: Performed by a 3rd Generation assay with a functional sensitivity of <=0.01 uIU/mL. Performed at [facility], [address], [place], [place] [address]    Free T4 02/04/2023 0.92  0.61 - 1.12 ng/dL Final   Comment: (NOTE) Biotin ingestion may interfere with free T4 tests. If the results are inconsistent with the TSH level, previous test results, or the clinical presentation, then consider biotin interference. If needed, order repeat testing after stopping biotin. Performed at [facility], [address] [place], [place] [address]   Admission on 02/02/2023, Discharged on 02/02/2023  Component Date Value Ref Range Status   HIV Screen 4th Generation wRfx 02/02/2023 Non Reactive  Non Reactive Final   Performed at [facility], [address] [place], [place] [address]   POC  Amphetamine UR 02/02/2023 None Detected  NONE DETECTED (Cut Off Level 1000 ng/mL) Final   POC Secobarbital (BAR) 02/02/2023 None Detected  NONE DETECTED (Cut Off Level 300 ng/mL) Final   POC Buprenorphine (BUP) 02/02/2023 Positive (A)  NONE DETECTED (Cut Off Level 10 ng/mL) Final   POC Oxazepam (BZO) 02/02/2023 Positive (A)  NONE DETECTED (Cut Off Level 300 ng/mL) Final   POC Cocaine UR 02/02/2023 Positive (A)  NONE DETECTED (Cut Off Level 300 ng/mL) Final   POC Methamphetamine UR 02/02/2023 None Detected  NONE DETECTED (Cut Off Level 1000 ng/mL) Final   POC Morphine 02/02/2023 None Detected  NONE DETECTED (Cut Off Level  300 ng/mL) Final   POC Methadone UR 02/02/2023 None Detected  NONE DETECTED (Cut Off Level 300 ng/mL) Final   POC Oxycodone UR 02/02/2023 None Detected  NONE DETECTED (Cut Off Level 100 ng/mL) Final   POC Marijuana UR 02/02/2023 None Detected  NONE DETECTED (Cut Off Level 50 ng/mL) Final   TSH 02/02/2023 0.244 (L)  0.350 - 4.500 uIU/mL Final   Comment: Performed by a 3rd Generation assay with a functional sensitivity of <=0.01 uIU/mL. Performed at [facility], [address] [place], [place] [address]    Cholesterol 02/02/2023 177  0 - 200 mg/dL Final   Triglycerides 60/45/4098 88  <150 mg/dL Final   HDL 11/91/4782 58  >40 mg/dL Final   Total CHOL/HDL Ratio 02/02/2023 3.1  RATIO Final   VLDL 02/02/2023 18  0 - 40 mg/dL Final   LDL Cholesterol 02/02/2023 101 (H)  0 - 99 mg/dL Final   Comment:        Total Cholesterol/HDL:CHD Risk Coronary Heart Disease Risk Table                     Men   Women  1/2 Average Risk   3.4   3.3  Average Risk       5.0   4.4  2 X Average Risk   9.6   7.1  3 X Average Risk  23.4   11.0        Use the calculated Patient Ratio above and the CHD Risk Table to determine the patient's CHD Risk.        ATP III CLASSIFICATION (LDL):  <100     mg/dL   Optimal  956-213  mg/dL   Near or Above                    Optimal  130-159  mg/dL    Borderline  086-578  mg/dL   High  >469     mg/dL   Very High Performed at [facility], [address], [place], [place] [address]    HCV Quantitative 02/02/2023 HCV Not Detected  >50 IU/mL Final   Test Information 02/02/2023 Comment   Final   Comment: (NOTE) The quantitative range of this assay is 15 IU/mL to 100 million IU/mL. Performed At: [facility] [address] [place], [place] [id number] Jolene Schimke MD UU:[id number]    Hep B S AB Quant (Post) 02/02/2023 10.3  Immunity>10 mIU/mL Final   Comment: (NOTE)  Status of Immunity                     Anti-HBs Level  ------------------                     -------------- Inconsistent with Immunity                  0.0 - 10.0 Consistent with Immunity                         >10.0 Performed At: [facility] [address] [place], [place] [id number] Jolene Schimke MD OV:[id number]   Admission on 01/31/2023, Discharged on 01/31/2023  Component Date Value Ref Range Status   WBC 01/31/2023 6.4  4.0 - 10.5 K/uL Final   RBC 01/31/2023 4.43  4.22 - 5.81 MIL/uL Final   Hemoglobin 01/31/2023 13.1  13.0 - 17.0 g/dL Final   HCT 84/16/6063 38.7 (L)  39.0 - 52.0 % Final   MCV 01/31/2023 87.4  80.0 - 100.0 fL Final   MCH 01/31/2023 29.6  26.0 - 34.0 pg Final   MCHC 01/31/2023 33.9  30.0 - 36.0 g/dL Final   RDW 30/86/5784 12.4  11.5 - 15.5 % Final   Platelets 01/31/2023 311  150 - 400 K/uL Final   nRBC 01/31/2023 0.0  0.0 - 0.2 % Final   Neutrophils Relative % 01/31/2023 55  % Final   Neutro Abs 01/31/2023 3.5  1.7 - 7.7 K/uL Final   Lymphocytes Relative 01/31/2023 30  % Final   Lymphs Abs 01/31/2023 1.9  0.7 - 4.0 K/uL Final   Monocytes Relative 01/31/2023 12  % Final   Monocytes Absolute 01/31/2023 0.8  0.1 - 1.0 K/uL Final   Eosinophils Relative 01/31/2023 3  % Final   Eosinophils Absolute 01/31/2023 0.2  0.0 - 0.5 K/uL Final   Basophils Relative 01/31/2023 0  % Final   Basophils Absolute 01/31/2023 0.0  0.0 -  0.1 K/uL Final   Immature Granulocytes 01/31/2023 0  % Final   Abs Immature Granulocytes 01/31/2023 0.01  0.00 - 0.07 K/uL Final   Performed at [facility], [address], [place], [place] [address]   Sodium 01/31/2023 136  135 - 145 mmol/L Final   Potassium 01/31/2023 3.9  3.5 - 5.1 mmol/L Final   Chloride 01/31/2023 100  98 - 111 mmol/L Final   CO2 01/31/2023 28  22 - 32 mmol/L Final   Glucose, Bld 01/31/2023 83  70 - 99 mg/dL Final   Glucose reference range applies only to samples taken after fasting for at least 8 hours.   BUN 01/31/2023 13  6 - 20 mg/dL Final   Creatinine, Ser 01/31/2023 0.80  0.61 - 1.24 mg/dL Final   Calcium 52/84/1324 8.8 (L)  8.9 - 10.3 mg/dL Final   Total Protein 40/01/2724 6.6  6.5 - 8.1 g/dL Final   Albumin 36/64/4034 3.7  3.5 - 5.0 g/dL Final   AST 74/25/9563 19  15 - 41 U/L Final   ALT 01/31/2023 15  0 - 44 U/L Final   Alkaline Phosphatase 01/31/2023 75  38 - 126 U/L Final   Total Bilirubin 01/31/2023 0.5  0.3 - 1.2 mg/dL Final   GFR, Estimated 01/31/2023 >60  >60 mL/min Final   Comment: (NOTE) Calculated using the CKD-EPI Creatinine Equation (2021)    Anion gap 01/31/2023 8  5 - 15 Final   Performed at [facility], [address] [place], [place] [address]   Troponin I (High Sensitivity) 01/31/2023 8  <18 ng/L Final   Comment: (NOTE) Elevated high sensitivity troponin I (hsTnI) values and significant  changes across serial measurements may suggest ACS but many other  chronic and acute conditions are known to elevate hsTnI results.  Refer to the "Links" section for chest pain algorithms and additional  guidance. Performed at [facility], [address] [place], [place] [address]    Alcohol, Ethyl (B) 01/31/2023 <10  <10 mg/dL Final   Comment: (NOTE) Lowest detectable limit for serum alcohol is 10 mg/dL.  For medical purposes only. Performed at [facility], [address] [place], [place] [address]    Opiates 01/31/2023 NONE DETECTED  NONE  DETECTED Final   Cocaine 01/31/2023 POSITIVE (A)  NONE DETECTED Final   Benzodiazepines 01/31/2023 POSITIVE (A)  NONE DETECTED Final   Amphetamines 01/31/2023 NONE DETECTED  NONE DETECTED Final   Tetrahydrocannabinol 01/31/2023 NONE DETECTED  NONE DETECTED  Final   Barbiturates 01/31/2023 NONE DETECTED  NONE DETECTED Final   Comment: (NOTE) DRUG SCREEN FOR MEDICAL PURPOSES ONLY.  IF CONFIRMATION IS NEEDED FOR ANY PURPOSE, NOTIFY LAB WITHIN 5 DAYS.  LOWEST DETECTABLE LIMITS FOR URINE DRUG SCREEN Drug Class                     Cutoff (ng/mL) Amphetamine and metabolites    1000 Barbiturate and metabolites    200 Benzodiazepine                 200 Opiates and metabolites        300 Cocaine and metabolites        300 THC                            50 Performed at [facility], [address] [place], [place] [address]    Troponin I (High Sensitivity) 01/31/2023 5  <18 ng/L Final   Comment: (NOTE) Elevated high sensitivity troponin I (hsTnI) values and significant  changes across serial measurements may suggest ACS but many other  chronic and acute conditions are known to elevate hsTnI results.  Refer to the "Links" section for chest pain algorithms and additional  guidance. Performed at [facility], [address] [place], [place] [address]   Admission on 01/30/2023, Discharged on 01/31/2023  Component Date Value Ref Range Status   Glucose-Capillary 01/30/2023 103 (H)  70 - 99 mg/dL Final   Glucose reference range applies only to samples taken after fasting for at least 8 hours.   WBC 01/30/2023 12.2 (H)  4.0 - 10.5 K/uL Final   RBC 01/30/2023 4.56  4.22 - 5.81 MIL/uL Final   Hemoglobin 01/30/2023 13.3  13.0 - 17.0 g/dL Final   HCT 29/56/2130 39.6  39.0 - 52.0 % Final   MCV 01/30/2023 86.8  80.0 - 100.0 fL Final   MCH 01/30/2023 29.2  26.0 - 34.0 pg Final   MCHC 01/30/2023 33.6  30.0 - 36.0 g/dL Final   RDW 86/57/8469 12.3  11.5 - 15.5 % Final   Platelets 01/30/2023 344  150 -  400 K/uL Final   nRBC 01/30/2023 0.0  0.0 - 0.2 % Final   Neutrophils Relative % 01/30/2023 80  % Final   Neutro Abs 01/30/2023 9.7 (H)  1.7 - 7.7 K/uL Final   Lymphocytes Relative 01/30/2023 11  % Final   Lymphs Abs 01/30/2023 1.4  0.7 - 4.0 K/uL Final   Monocytes Relative 01/30/2023 8  % Final   Monocytes Absolute 01/30/2023 1.0  0.1 - 1.0 K/uL Final   Eosinophils Relative 01/30/2023 1  % Final   Eosinophils Absolute 01/30/2023 0.1  0.0 - 0.5 K/uL Final   Basophils Relative 01/30/2023 0  % Final   Basophils Absolute 01/30/2023 0.0  0.0 - 0.1 K/uL Final   Immature Granulocytes 01/30/2023 0  % Final   Abs Immature Granulocytes 01/30/2023 0.04  0.00 - 0.07 K/uL Final   Performed at [facility], [address], [place], [place] [address]   Sodium 01/30/2023 135  135 - 145 mmol/L Final   Potassium 01/30/2023 4.3  3.5 - 5.1 mmol/L Final   Chloride 01/30/2023 100  98 - 111 mmol/L Final   CO2 01/30/2023 27  22 - 32 mmol/L Final   Glucose, Bld 01/30/2023 100 (H)  70 - 99 mg/dL Final   Glucose reference range applies only to samples taken after fasting  for at least 8 hours.   BUN 01/30/2023 16  6 - 20 mg/dL Final   Creatinine, Ser 01/30/2023 0.83  0.61 - 1.24 mg/dL Final   Calcium 40/98/1191 8.9  8.9 - 10.3 mg/dL Final   Total Protein 47/82/9562 7.0  6.5 - 8.1 g/dL Final   Albumin 13/11/6576 4.1  3.5 - 5.0 g/dL Final   AST 46/96/2952 21  15 - 41 U/L Final   ALT 01/30/2023 14  0 - 44 U/L Final   Alkaline Phosphatase 01/30/2023 79  38 - 126 U/L Final   Total Bilirubin 01/30/2023 0.4  0.3 - 1.2 mg/dL Final   GFR, Estimated 01/30/2023 >60  >60 mL/min Final   Comment: (NOTE) Calculated using the CKD-EPI Creatinine Equation (2021)    Anion gap 01/30/2023 8  5 - 15 Final   Performed at [facility], [address] [place], [place] [address]   Opiates 01/30/2023 NONE DETECTED  NONE DETECTED Final   Cocaine 01/30/2023 POSITIVE (A)  NONE DETECTED Final   Benzodiazepines 01/30/2023 POSITIVE (A)   NONE DETECTED Final   Amphetamines 01/30/2023 NONE DETECTED  NONE DETECTED Final   Tetrahydrocannabinol 01/30/2023 NONE DETECTED  NONE DETECTED Final   Barbiturates 01/30/2023 NONE DETECTED  NONE DETECTED Final   Comment: (NOTE) DRUG SCREEN FOR MEDICAL PURPOSES ONLY.  IF CONFIRMATION IS NEEDED FOR ANY PURPOSE, NOTIFY LAB WITHIN 5 DAYS.  LOWEST DETECTABLE LIMITS FOR URINE DRUG SCREEN Drug Class                     Cutoff (ng/mL) Amphetamine and metabolites    1000 Barbiturate and metabolites    200 Benzodiazepine                 200 Opiates and metabolites        300 Cocaine and metabolites        300 THC                            50 Performed at [facility], [address] [place], [place] [address]    Troponin I (High Sensitivity) 01/30/2023 <2  <18 ng/L Final   Comment: (NOTE) Elevated high sensitivity troponin I (hsTnI) values and significant  changes across serial measurements may suggest ACS but many other  chronic and acute conditions are known to elevate hsTnI results.  Refer to the "Links" section for chest pain algorithms and additional  guidance. Performed at [facility], [address], [place], [place] [address]    Troponin I (High Sensitivity) 01/30/2023 <2  <18 ng/L Final   Comment: (NOTE) Elevated high sensitivity troponin I (hsTnI) values and significant  changes across serial measurements may suggest ACS but many other  chronic and acute conditions are known to elevate hsTnI results.  Refer to the "Links" section for chest pain algorithms and additional  guidance. Performed at [facility], [address] [place], [place] [address]   Admission on 01/05/2023, Discharged on 01/05/2023  Component Date Value Ref Range Status   Sodium 01/05/2023 138  135 - 145 mmol/L Final   Potassium 01/05/2023 4.2  3.5 - 5.1 mmol/L Final   Chloride 01/05/2023 101  98 - 111 mmol/L Final   CO2 01/05/2023 26  22 - 32 mmol/L Final   Glucose, Bld 01/05/2023 115 (H)  70 - 99  mg/dL Final   Glucose reference range applies only to samples taken after fasting for at least 8 hours.  BUN 01/05/2023 13  6 - 20 mg/dL Final   Creatinine, Ser 01/05/2023 0.91  0.61 - 1.24 mg/dL Final   Calcium 07/37/1062 10.0  8.9 - 10.3 mg/dL Final   Total Protein 69/48/5462 7.7  6.5 - 8.1 g/dL Final   Albumin 70/35/0093 4.5  3.5 - 5.0 g/dL Final   AST 81/82/9937 20  15 - 41 U/L Final   ALT 01/05/2023 16  0 - 44 U/L Final   Alkaline Phosphatase 01/05/2023 79  38 - 126 U/L Final   Total Bilirubin 01/05/2023 0.9  0.3 - 1.2 mg/dL Final   GFR, Estimated 01/05/2023 >60  >60 mL/min Final   Comment: (NOTE) Calculated using the CKD-EPI Creatinine Equation (2021)    Anion gap 01/05/2023 11  5 - 15 Final   Performed at [facility], [address] [place], [place] [address]   Alcohol, Ethyl (B) 01/05/2023 <10  <10 mg/dL Final   Comment: (NOTE) Lowest detectable limit for serum alcohol is 10 mg/dL.  For medical purposes only. Performed at [facility], [address], [place], [place] [address]    WBC 01/05/2023 8.7  4.0 - 10.5 K/uL Final   RBC 01/05/2023 5.23  4.22 - 5.81 MIL/uL Final   Hemoglobin 01/05/2023 15.4  13.0 - 17.0 g/dL Final   HCT 01/75/1025 46.2  39.0 - 52.0 % Final   MCV 01/05/2023 88.3  80.0 - 100.0 fL Final   MCH 01/05/2023 29.4  26.0 - 34.0 pg Final   MCHC 01/05/2023 33.3  30.0 - 36.0 g/dL Final   RDW 85/27/7824 12.7  11.5 - 15.5 % Final   Platelets 01/05/2023 334  150 - 400 K/uL Final   nRBC 01/05/2023 0.0  0.0 - 0.2 % Final   Performed at [facility], [address] [place], [place] [address]   Opiates 01/05/2023 NONE DETECTED  NONE DETECTED Final   Cocaine 01/05/2023 POSITIVE (A)  NONE DETECTED Final   Benzodiazepines 01/05/2023 NONE DETECTED  NONE DETECTED Final   Amphetamines 01/05/2023 POSITIVE (A)  NONE DETECTED Final   Tetrahydrocannabinol 01/05/2023 POSITIVE (A)  NONE DETECTED Final   Barbiturates 01/05/2023 NONE DETECTED  NONE DETECTED Final   Comment:  (NOTE) DRUG SCREEN FOR MEDICAL PURPOSES ONLY.  IF CONFIRMATION IS NEEDED FOR ANY PURPOSE, NOTIFY LAB WITHIN 5 DAYS.  LOWEST DETECTABLE LIMITS FOR URINE DRUG SCREEN Drug Class                     Cutoff (ng/mL) Amphetamine and metabolites    1000 Barbiturate and metabolites    200 Benzodiazepine                 200 Opiates and metabolites        300 Cocaine and metabolites        300 THC                            50 Performed at [facility], [address], [place], [place] [address]     Allergies: Peanuts [nuts], Penicillins, Shellfish allergy, and Cyclobenzaprine hcl  Medications:  Facility Ordered Medications  Medication   acetaminophen (TYLENOL) tablet 650 mg   alum & mag hydroxide-simeth (MAALOX/MYLANTA) 200-200-20 MG/5ML suspension 30 mL   magnesium hydroxide (MILK OF MAGNESIA) suspension 30 mL   buprenorphine (SUBUTEX) SL tablet 2 mg   dicyclomine (BENTYL) tablet 20 mg   hydrOXYzine (ATARAX) tablet 25 mg   loperamide (IMODIUM) capsule 2-4 mg  methocarbamol (ROBAXIN) tablet 500 mg   naproxen (NAPROSYN) tablet 500 mg   ondansetron (ZOFRAN-ODT) disintegrating tablet 4 mg   sertraline (ZOLOFT) tablet 100 mg   prazosin (MINIPRESS) capsule 1 mg   traZODone (DESYREL) tablet 50 mg   nicotine (NICODERM CQ - dosed in mg/24 hours) patch 21 mg   thiamine (VITAMIN B1) tablet 100 mg   PTA Medications  Medication Sig   albuterol (VENTOLIN HFA) 108 (90 Base) MCG/ACT inhaler Inhale 1-2 puffs into the lungs every 6 (six) hours as needed for wheezing or shortness of breath.   sertraline (ZOLOFT) 50 MG tablet Take 50 mg by mouth daily.    Long Term Goals: Improvement in symptoms so as ready for discharge  Short Term Goals: Patient will verbalize feelings in meetings with treatment team members., Patient will attend at least of 50% of the groups daily., Pt will complete the PHQ9 on admission, day 3 and discharge., Patient will participate in completing the [place] Suicide Severity  Rating Scale, Patient will score a low risk of violence for 24 hours prior to discharge, and Patient will take medications as prescribed daily.  Medical Decision Making  Recommend re-admission to the [facility] for substance abuse treatment.  I reviewed the labs complted at [facility]: CMP wnl, CBC, wnl, Blood cultures x2-in process, Head CT-No acute intercranial abnormality-see imaging report, MRI Brain-Normal. EKG-NSR.  OUD- COWS scoring and CIWA already discontinued for patient on 02/02/23.  Patient then placed on  -Clonidine Taper with Subutex induction schedule for 2 mg SL BID-will continue.  Scheduled meds -Thiamine 100 mg PO daily -Minipress 1 mg PO at bedtime for nightmares -Zoloft 100 mg PO daily for MDD and PTSD symptoms -Nicoderm CQ transdermal patch, 21 mg daily for nicotine dependence  PRNs -Tylenol 650 mg every 6 hours as needed for mild pain -Naproxen 500 mg BID as needed for pain -Bentyl 20 mg every 6 hours as needed for spasms/abdominal cramping -Robaxin 500 mg every 8 hours as needed for muscle spasms -Zofran 4 mg every 6 hours as needed for nausea or vomiting -Imodium 2 to 4 mg as needed for diarrhea or loose stools -Maalox/Mylanta 30 mL every 4 hours as needed for indigestion -Milk of Mag 30 mL as needed for constipation -Trazodone 50 mg PO at bedtime, insomnia    Recommendations  Based on my evaluation the patient does not appear to have an emergency medical condition.  Recommend re-admission to the [facility] for substance abuse treatment and stabilization.   Mancel Bale, NP 02/07/23  4:20 AM

## 2023-02-08 DIAGNOSIS — F1910 Other psychoactive substance abuse, uncomplicated: Secondary | ICD-10-CM | POA: Diagnosis not present

## 2023-02-08 MED ORDER — BUPRENORPHINE HCL-NALOXONE HCL 8-2 MG SL SUBL: 1.0000 | SUBLINGUAL_TABLET | Freq: Two times a day (BID) | SUBLINGUAL | 0 refills | Status: DC

## 2023-02-08 MED ORDER — BUPRENORPHINE HCL-NALOXONE HCL 8-2 MG SL SUBL: 1.0000 | SUBLINGUAL_TABLET | Freq: Two times a day (BID) | SUBLINGUAL | 0 refills | Status: AC

## 2023-02-08 MED ORDER — QUETIAPINE FUMARATE ER 50 MG PO TB24: 100.0000 mg | ORAL_TABLET | Freq: Every day | ORAL | Status: DC

## 2023-02-08 MED ORDER — VITAMIN B-1 100 MG PO TABS: 100.0000 mg | ORAL_TABLET | Freq: Every day | ORAL | 0 refills | Status: AC

## 2023-02-08 MED ORDER — NICOTINE 21 MG/24HR TD PT24: 21.0000 mg | MEDICATED_PATCH | Freq: Every day | TRANSDERMAL | 0 refills | Status: AC

## 2023-02-08 MED ORDER — NICOTINE POLACRILEX 2 MG MT GUM: 2.0000 mg | CHEWING_GUM | OROMUCOSAL | 0 refills | Status: AC

## 2023-02-08 MED ORDER — QUETIAPINE FUMARATE 100 MG PO TABS: 100.0000 mg | ORAL_TABLET | Freq: Every day | ORAL | 0 refills | Status: AC

## 2023-02-08 MED ORDER — DIVALPROEX SODIUM ER 500 MG PO TB24: 500.0000 mg | ORAL_TABLET | Freq: Every day | ORAL | 0 refills | Status: DC

## 2023-02-08 MED ORDER — TRAZODONE HCL 50 MG PO TABS: 50.0000 mg | ORAL_TABLET | Freq: Every evening | ORAL | 0 refills | Status: AC | PRN

## 2023-02-08 MED ORDER — BUPRENORPHINE HCL-NALOXONE HCL 8-2 MG SL SUBL: 2.0000 | SUBLINGUAL_TABLET | Freq: Every day | SUBLINGUAL | 0 refills | Status: DC

## 2023-02-08 MED ORDER — PRAZOSIN HCL 1 MG PO CAPS: 1.0000 mg | ORAL_CAPSULE | Freq: Every day | ORAL | 0 refills | Status: AC

## 2023-02-08 MED ORDER — ALBUTEROL SULFATE HFA 108 (90 BASE) MCG/ACT IN AERS: 2.0000 | INHALATION_SPRAY | RESPIRATORY_TRACT | 0 refills | Status: AC | PRN

## 2023-02-08 NOTE — ED Notes (Signed)
Pt demanding to leave to the street.  Pt spoke with Social worker Alona Bene about transportation to [employer].  Pt wants to wait in the waiting area for transportation, MD and Social worker aware.

## 2023-02-08 NOTE — Discharge Planning (Signed)
LCSW was informed by staff that patient is requesting to speak to Child psychotherapist.  LCSW went and spoke with patient who reports he would like to discharge and be placed in the lobby until [facility] driver arrives.  Patient reports he spoke with his wife on the last night who reports she will bring daughter to see patient before he discharges. Patient was very adamant about seeing his daughter prior to admitting into [facility].  Patient was informed that treatment team recommendation is for him to remain on the unit until the driver arrives.  Patient was anxious and frustrated regarding this decision.  LCSW received a phone call from driver that he would be present within 5 minutes. Staff escorted the patient off the [facility] unit.  LCSW was informed that patient did not get in the car with [facility] driver. LCSW went and spoke with patient outside of the facility.  LCSW was informed that he would like to see his daughter as that would be "psychological better for him" before leaving.  Patient reports he spoke with his wife recently who reports she "will transport him to [facility] [employer] once he sees his daughter". Patient was provided brief counseling regarding his decision.  The Admissions Coordinator Whitney from [facility] contacted LCSW with interest in screening the patient on today.  Patient was provided the contact information for [facility] in [place], and pt reports he will give them a call on today to see what arrangements can be made if appropriate. No other concerns were reported by patient at this time. Patient reports appreciation of LCSW assistance with finding placement for him.  LCSW to sign off.   Patient is aware of treatment options and has additional resources in AV[facility].   Fernande Boyden, LCSW Clinical Social Worker [facility] Ph: [phone]

## 2023-02-08 NOTE — ED Notes (Signed)
Patient is sleeping. Respirations equal and unlabored, skin warm and dry. No change in assessment or acuity. Routine safety checks conducted according to facility protocol. Will continue to monitor for safety.   

## 2023-02-08 NOTE — ED Notes (Signed)
Pt was provided breakfast.

## 2023-02-08 NOTE — Group Note (Signed)
Group Topic: Emotional Regulation  Group Date: 02/08/2023 Start Time: 1000 End Time: 1030 Facilitators: Londell Moh, NT  Department: [facility]  Number of Participants: 3  Group Focus: acceptance, check in, coping skills, and daily focus Treatment Modality:  Psychoeducation Interventions utilized were patient education Purpose: express feelings and increase insight  Name: Eric Lowery Date of Birth: 01/09/1986  MR: [id number]    Level of Participation: active Quality of Participation: engaged Interactions with others: gave feedback Mood/Affect: anxious Triggers (if applicable): being able to see his daughter Cognition: coherent/clear Progress: Gaining insight Response: pt was anxious about his discharge and being able to see his daughter Plan: patient will be encouraged to continue to attend groups  Patients Problems:  Patient Active Problem List   Diagnosis Date Noted   Substance abuse (HCC) 02/07/2023   Opioid use disorder, severe, dependence (HCC) 02/02/2023   On high dose antipsychotic drug therapy 02/02/2023

## 2023-02-08 NOTE — ED Notes (Signed)
Pt A&O x 4, no distress noted. Calm & cooperative, interactive with staff.  Monitoring for safety.  Pending discharge to [facility].

## 2023-02-08 NOTE — ED Provider Notes (Signed)
FBC/OBS ASAP Discharge Summary  Date and Time: 02/08/2023 1:38 PM  Name: Eric Lowery  MRN:  [id number]   Discharge Diagnoses:  Final diagnoses:  Substance abuse (HCC)  Polysubstance abuse (HCC)  Opioid use disorder   Stay Summary:   Eric Lowery is a 37 yo male with a PPHx of opioid use disorder, stimulant use disorder, and tobacco use disorder, admitted to [facility] on 10/17 for detox from recent fentanyl and cocaine use with interest in [facility]. He reports a hx of IV drug use. UDS on admission is positive for buprenorphine, benzodiazepines, and cocaine.   During the course of this patient's hospitalization, inconsistencies were noted between his subjective reports of psychiatric and neurological symptoms and observed behaviors. The patient demonstrated manipulative tendencies and exhibited behaviors suggestive of malingering. Specifically, he reported gait instability and required the use of a rolling walker, which was inconsistent with the neurological evaluation. He was transferred to the ED for further neurological assessment, which revealed no significant findings. Following this evaluation, his gait instability resolved abruptly, and he no longer required the walker. Additionally, the patient provided phone numbers for his wife, which he later admitted were false, further raising concerns regarding the validity of his reported symptoms.   Medications previously prescribed based on subjective reports, without corroborative objective findings, have been re-evaluated. At this time, Depakote has been discontinued at discharge due to the unclear psychiatric diagnosis, while Seroquel will be continued.   The patient had been scheduled for transfer to [facility]; however, when the driver arrived to transport him, he declined the pick-up.    Patient was discharged with the following medications: Prazosin 1 mg at bedtime Seroquel 100 mg nightly Trazodone 50 mg  at bedtime PRN NRT  While hospitalized, patient had re-induction with subutex, with plan to transition to suboxone at discharge. Prescription of suboxone was withheld when patient declined transfer to [facility].    Total Time spent with patient: 30 minutes  Substance Use Hx: Fentanyl: IV use 3.5 grams 2 days ago. Uses it daily for the past 2.5 months.  Cocaine: used today, intranasal. 2.5-3.0 grams daily for the past 2 months (since wife separated) Tobacco: 2 ppd for the past 20 years Alcohol - Drinks moonshine nightly when he comes back from work, around 0.5 gallons a night.  Patient has participated in rehab with [facility] in [place] and in [place]. Pt recently enrolled in substance abuse treatment program with [facility] but left after 15 hours because "it was a faith based program and I don't agree with their philosophy."    Past Psychiatric Hx: Current Psychiatrist: Dr. April Manson, in [place] Previous Psychiatric Diagnoses: pt is unable to specify any formal previous psychiatric diagnoses Psychiatric medication history/compliance: reports compliance     Past Medical History: Allergies: penicillins, shellfish, peanuts Seizures: reports previous hx of epilepsy, has not had a seizure in "years", reports previously being prescribed Keppra.      Social History: Marital Status: Married Children: 3 children Legal: Upcoming court date in December to get his license 2/2 texting and driving. No past hx of incarcerations or legal charges.  Military: No  Current Medications:  Current Facility-Administered Medications  Medication Dose Route Frequency Provider Last Rate Last Admin   acetaminophen (TYLENOL) tablet 650 mg  650 mg Oral Q6H PRN Onuoha, Chinwendu V, NP       albuterol (VENTOLIN HFA) 108 (90 Base) MCG/ACT inhaler 2 puff  2 puff Inhalation Q4H PRN Lorri Frederick, MD  alum & mag hydroxide-simeth (MAALOX/MYLANTA) 200-200-20 MG/5ML  suspension 30 mL  30 mL Oral Q4H PRN Onuoha, Chinwendu V, NP       buprenorphine (SUBUTEX) SL tablet 2 mg  2 mg Sublingual BID Onuoha, Chinwendu V, NP   2 mg at 02/08/23 0820   dicyclomine (BENTYL) tablet 20 mg  20 mg Oral Q6H PRN Onuoha, Chinwendu V, NP       divalproex (DEPAKOTE ER) 24 hr tablet 500 mg  500 mg Oral QHS Carrion-Carrero, Jemeka Wagler, MD   500 mg at 02/07/23 2110   hydrOXYzine (ATARAX) tablet 25 mg  25 mg Oral Q6H PRN Onuoha, Chinwendu V, NP   25 mg at 02/08/23 4098   loperamide (IMODIUM) capsule 2-4 mg  2-4 mg Oral PRN Onuoha, Chinwendu V, NP       magnesium hydroxide (MILK OF MAGNESIA) suspension 30 mL  30 mL Oral Daily PRN Onuoha, Chinwendu V, NP       methocarbamol (ROBAXIN) tablet 500 mg  500 mg Oral Q8H PRN Onuoha, Chinwendu V, NP       naproxen (NAPROSYN) tablet 500 mg  500 mg Oral BID PRN Onuoha, Chinwendu V, NP       nicotine (NICODERM CQ - dosed in mg/24 hours) patch 21 mg  21 mg Transdermal Q0600 Onuoha, Chinwendu V, NP   21 mg at 02/08/23 0615   nicotine polacrilex (NICORETTE) gum 2 mg  2 mg Oral Q4H while awake Carrion-Carrero, Marylee Belzer, MD   2 mg at 02/08/23 0909   ondansetron (ZOFRAN-ODT) disintegrating tablet 4 mg  4 mg Oral Q6H PRN Onuoha, Chinwendu V, NP       prazosin (MINIPRESS) capsule 1 mg  1 mg Oral QHS Onuoha, Chinwendu V, NP   1 mg at 02/07/23 2110   thiamine (VITAMIN B1) tablet 100 mg  100 mg Oral Daily Otho Bellows, RPH   100 mg at 02/08/23 0909   traZODone (DESYREL) tablet 50 mg  50 mg Oral QHS PRN Onuoha, Chinwendu V, NP   50 mg at 02/07/23 2111   Current Outpatient Medications  Medication Sig Dispense Refill   QUEtiapine (SEROQUEL) 100 MG tablet Take 1 tablet (100 mg total) by mouth at bedtime. 30 tablet 0   albuterol (VENTOLIN HFA) 108 (90 Base) MCG/ACT inhaler Inhale 2 puffs into the lungs every 4 (four) hours as needed for wheezing or shortness of breath. 1 each 0   buprenorphine-naloxone (SUBOXONE) 8-2 mg SUBL SL tablet Place 1 tablet under the  tongue 2 (two) times daily for 4 days. 7 tablet 0   [START ON 02/12/2023] buprenorphine-naloxone (SUBOXONE) 8-2 mg SUBL SL tablet Place 1 tablet under the tongue 2 (two) times daily for 3 days. 6 tablet 0   [START ON 02/09/2023] nicotine (NICODERM CQ - DOSED IN MG/24 HOURS) 21 mg/24hr patch Place 1 patch (21 mg total) onto the skin daily at 6 (six) AM. 28 patch 0   nicotine polacrilex (NICORETTE) 2 MG gum Take 1 each (2 mg total) by mouth every 4 (four) hours while awake. 100 tablet 0   prazosin (MINIPRESS) 1 MG capsule Take 1 capsule (1 mg total) by mouth at bedtime. 30 capsule 0   thiamine (VITAMIN B-1) 100 MG tablet Take 1 tablet (100 mg total) by mouth daily. 30 tablet 0   traZODone (DESYREL) 50 MG tablet Take 1 tablet (50 mg total) by mouth at bedtime as needed for sleep. 30 tablet 0    PTA Medications:  PTA Medications  Medication Sig  QUEtiapine (SEROQUEL) 100 MG tablet Take 1 tablet (100 mg total) by mouth at bedtime.   traZODone (DESYREL) 50 MG tablet Take 1 tablet (50 mg total) by mouth at bedtime as needed for sleep.   thiamine (VITAMIN B-1) 100 MG tablet Take 1 tablet (100 mg total) by mouth daily.   prazosin (MINIPRESS) 1 MG capsule Take 1 capsule (1 mg total) by mouth at bedtime.   [START ON 02/09/2023] nicotine (NICODERM CQ - DOSED IN MG/24 HOURS) 21 mg/24hr patch Place 1 patch (21 mg total) onto the skin daily at 6 (six) AM.   nicotine polacrilex (NICORETTE) 2 MG gum Take 1 each (2 mg total) by mouth every 4 (four) hours while awake.   albuterol (VENTOLIN HFA) 108 (90 Base) MCG/ACT inhaler Inhale 2 puffs into the lungs every 4 (four) hours as needed for wheezing or shortness of breath.   buprenorphine-naloxone (SUBOXONE) 8-2 mg SUBL SL tablet Place 1 tablet under the tongue 2 (two) times daily for 4 days.   [START ON 02/12/2023] buprenorphine-naloxone (SUBOXONE) 8-2 mg SUBL SL tablet Place 1 tablet under the tongue 2 (two) times daily for 3 days.   Facility Ordered Medications   Medication   acetaminophen (TYLENOL) tablet 650 mg   alum & mag hydroxide-simeth (MAALOX/MYLANTA) 200-200-20 MG/5ML suspension 30 mL   magnesium hydroxide (MILK OF MAGNESIA) suspension 30 mL   buprenorphine (SUBUTEX) SL tablet 2 mg   dicyclomine (BENTYL) tablet 20 mg   hydrOXYzine (ATARAX) tablet 25 mg   loperamide (IMODIUM) capsule 2-4 mg   methocarbamol (ROBAXIN) tablet 500 mg   naproxen (NAPROSYN) tablet 500 mg   ondansetron (ZOFRAN-ODT) disintegrating tablet 4 mg   prazosin (MINIPRESS) capsule 1 mg   traZODone (DESYREL) tablet 50 mg   nicotine (NICODERM CQ - dosed in mg/24 hours) patch 21 mg   thiamine (VITAMIN B1) tablet 100 mg   albuterol (VENTOLIN HFA) 108 (90 Base) MCG/ACT inhaler 2 puff   [COMPLETED] QUEtiapine (SEROQUEL XR) 24 hr tablet 100 mg   divalproex (DEPAKOTE ER) 24 hr tablet 500 mg   nicotine polacrilex (NICORETTE) gum 2 mg       02/08/2023    8:54 AM 02/07/2023    3:57 AM 02/05/2023   10:56 AM  Depression screen PHQ 2/9  Decreased Interest 0 3 1  Down, Depressed, Hopeless 0 3 1  PHQ - 2 Score 0 6 2  Altered sleeping 1 3 1   Tired, decreased energy 0 3 1  Change in appetite 1 3 2   Feeling bad or failure about yourself  0 3 3  Trouble concentrating 0 3 2  Moving slowly or fidgety/restless 1 3 2   Suicidal thoughts 0 0 2  PHQ-9 Score 3 24 15   Difficult doing work/chores  Very difficult     Flowsheet Row ED from 02/07/2023 in [facility] ED from 02/06/2023 in [facility] Emergency Department at [facility] ED from 02/02/2023 in [facility]  C-SSRS RISK CATEGORY No Risk No Risk No Risk       Musculoskeletal  Strength & Muscle Tone: within normal limits Gait & Station: normal Patient leans: N/A  Psychiatric Specialty Exam  Presentation  General Appearance:  Appropriate for Environment  Eye Contact: Good  Speech: Clear and Coherent; Normal Rate  Speech  Volume: Normal  Handedness: -- (not assessed)   Mood and Affect  Mood: -- ("I am ready")  Affect: Congruent; Full Range   Thought Process  Thought Processes: Linear  Descriptions of Associations:Intact  Orientation:-- (grossly intact)  Thought Content:Logical  Diagnosis of Schizophrenia or Schizoaffective disorder in past: No    Hallucinations:Hallucinations: None  Ideas of Reference:None  Suicidal Thoughts:Suicidal Thoughts: No  Homicidal Thoughts:Homicidal Thoughts: No   Sensorium  Memory: Immediate Good; Recent Good; Remote Good  Judgment: Fair  Insight: Fair   Chartered certified accountant: Fair  Attention Span: Fair  Recall: Fair  Fund of Knowledge: Fair  Language: Fair   Psychomotor Activity  Psychomotor Activity: Psychomotor Activity: Normal   Assets  Assets: Desire for Improvement; Resilience; Communication Skills   Sleep  Sleep: Sleep: Fair   Nutritional Assessment (For OBS and FBC admissions only) Has the patient had a weight loss or gain of 10 pounds or more in the last 3 months?: No Has the patient had a decrease in food intake/or appetite?: No Does the patient have dental problems?: No Does the patient have eating habits or behaviors that may be indicators of an eating disorder including binging or inducing vomiting?: No Has the patient recently lost weight without trying?: 0 Has the patient been eating poorly because of a decreased appetite?: 0 Malnutrition Screening Tool Score: 0    Physical Exam  Physical Exam Vitals and nursing note reviewed.  Constitutional:      General: He is not in acute distress.    Appearance: He is not ill-appearing.  HENT:     Head: Normocephalic and atraumatic.  Pulmonary:     Effort: Pulmonary effort is normal. No respiratory distress.  Skin:    General: Skin is warm and dry.    Review of Systems  All other systems reviewed and are negative.  Blood pressure  117/81, pulse 83, temperature 97.6 F (36.4 C), temperature source Oral, resp. rate 18, SpO2 98%. There is no height or weight on file to calculate BMI.  Demographic Factors:  Male and Caucasian  Loss Factors: Legal issues  Historical Factors: Impulsivity  Risk Reduction Factors:   NA  Continued Clinical Symptoms:  Alcohol/Substance Abuse/Dependencies  Cognitive Features That Contribute To Risk:  None    Suicide Risk:  Mild:  Suicidal ideation of limited frequency, intensity, duration, and specificity.  There are no identifiable plans, no associated intent, mild dysphoria and related symptoms, good self-control (both objective and subjective assessment), few other risk factors, and identifiable protective factors, including available and accessible social support.   Plan Of Care/Follow-up recommendations:  Activity: as tolerated  Diet: heart healthy  Other: -Follow-up with your outpatient psychiatric provider -instructions on appointment date, time, and address (location) are provided to you in discharge paperwork.  -Take your psychiatric medications as prescribed at discharge - instructions are provided to you in the discharge paperwork  -Recommend abstinence from alcohol, tobacco, and other illicit drug use at discharge.   -If your psychiatric symptoms recur, worsen, or if you have side effects to your psychiatric medications, call your outpatient psychiatric provider, [phone] or go to the nearest emergency department.  -If suicidal thoughts recur, call your outpatient psychiatric provider, 911, [phone] or go to the nearest emergency department.   Signed: Lorri Frederick, MD 02/08/2023, 1:38 PM

## 2023-02-09 DIAGNOSIS — F1110 Opioid abuse, uncomplicated: Secondary | ICD-10-CM | POA: Diagnosis not present

## 2023-02-09 DIAGNOSIS — F1120 Opioid dependence, uncomplicated: Secondary | ICD-10-CM | POA: Diagnosis not present

## 2023-02-11 DIAGNOSIS — F1120 Opioid dependence, uncomplicated: Secondary | ICD-10-CM | POA: Diagnosis not present

## 2023-02-11 DIAGNOSIS — F1220 Cannabis dependence, uncomplicated: Secondary | ICD-10-CM | POA: Diagnosis not present

## 2023-02-11 DIAGNOSIS — F1520 Other stimulant dependence, uncomplicated: Secondary | ICD-10-CM | POA: Diagnosis not present

## 2023-02-11 DIAGNOSIS — F1420 Cocaine dependence, uncomplicated: Secondary | ICD-10-CM | POA: Diagnosis not present

## 2023-02-11 LAB — CULTURE, BLOOD (ROUTINE X 2)
Culture: NO GROWTH
Culture: NO GROWTH
Special Requests: ADEQUATE

## 2023-02-13 DIAGNOSIS — F1520 Other stimulant dependence, uncomplicated: Secondary | ICD-10-CM | POA: Diagnosis not present

## 2023-02-13 DIAGNOSIS — F1420 Cocaine dependence, uncomplicated: Secondary | ICD-10-CM | POA: Diagnosis not present

## 2023-02-13 DIAGNOSIS — F1220 Cannabis dependence, uncomplicated: Secondary | ICD-10-CM | POA: Diagnosis not present

## 2023-02-13 DIAGNOSIS — F1120 Opioid dependence, uncomplicated: Secondary | ICD-10-CM | POA: Diagnosis not present

## 2023-02-14 DIAGNOSIS — F1520 Other stimulant dependence, uncomplicated: Secondary | ICD-10-CM | POA: Diagnosis not present

## 2023-02-14 DIAGNOSIS — F1120 Opioid dependence, uncomplicated: Secondary | ICD-10-CM | POA: Diagnosis not present

## 2023-02-14 DIAGNOSIS — F1220 Cannabis dependence, uncomplicated: Secondary | ICD-10-CM | POA: Diagnosis not present

## 2023-02-14 DIAGNOSIS — F1420 Cocaine dependence, uncomplicated: Secondary | ICD-10-CM | POA: Diagnosis not present

## 2023-02-15 DIAGNOSIS — F1120 Opioid dependence, uncomplicated: Secondary | ICD-10-CM | POA: Diagnosis not present

## 2023-02-15 DIAGNOSIS — F1220 Cannabis dependence, uncomplicated: Secondary | ICD-10-CM | POA: Diagnosis not present

## 2023-02-15 DIAGNOSIS — F1520 Other stimulant dependence, uncomplicated: Secondary | ICD-10-CM | POA: Diagnosis not present

## 2023-02-15 DIAGNOSIS — F1420 Cocaine dependence, uncomplicated: Secondary | ICD-10-CM | POA: Diagnosis not present

## 2023-02-16 DIAGNOSIS — F1220 Cannabis dependence, uncomplicated: Secondary | ICD-10-CM | POA: Diagnosis not present

## 2023-02-16 DIAGNOSIS — F1120 Opioid dependence, uncomplicated: Secondary | ICD-10-CM | POA: Diagnosis not present

## 2023-02-16 DIAGNOSIS — F1520 Other stimulant dependence, uncomplicated: Secondary | ICD-10-CM | POA: Diagnosis not present

## 2023-02-16 DIAGNOSIS — F1420 Cocaine dependence, uncomplicated: Secondary | ICD-10-CM | POA: Diagnosis not present

## 2023-02-17 DIAGNOSIS — F1520 Other stimulant dependence, uncomplicated: Secondary | ICD-10-CM | POA: Diagnosis not present

## 2023-02-17 DIAGNOSIS — F1420 Cocaine dependence, uncomplicated: Secondary | ICD-10-CM | POA: Diagnosis not present

## 2023-02-17 DIAGNOSIS — F1220 Cannabis dependence, uncomplicated: Secondary | ICD-10-CM | POA: Diagnosis not present

## 2023-02-17 DIAGNOSIS — F1120 Opioid dependence, uncomplicated: Secondary | ICD-10-CM | POA: Diagnosis not present

## 2023-02-18 DIAGNOSIS — R5383 Other fatigue: Secondary | ICD-10-CM | POA: Diagnosis not present

## 2023-02-18 DIAGNOSIS — F1120 Opioid dependence, uncomplicated: Secondary | ICD-10-CM | POA: Diagnosis not present

## 2023-02-18 DIAGNOSIS — F319 Bipolar disorder, unspecified: Secondary | ICD-10-CM | POA: Diagnosis not present

## 2023-02-18 DIAGNOSIS — F1520 Other stimulant dependence, uncomplicated: Secondary | ICD-10-CM | POA: Diagnosis not present

## 2023-02-18 DIAGNOSIS — F1420 Cocaine dependence, uncomplicated: Secondary | ICD-10-CM | POA: Diagnosis not present

## 2023-02-18 DIAGNOSIS — F1220 Cannabis dependence, uncomplicated: Secondary | ICD-10-CM | POA: Diagnosis not present

## 2023-03-16 DIAGNOSIS — F1420 Cocaine dependence, uncomplicated: Secondary | ICD-10-CM | POA: Diagnosis not present

## 2023-03-16 DIAGNOSIS — F1220 Cannabis dependence, uncomplicated: Secondary | ICD-10-CM | POA: Diagnosis not present

## 2023-03-16 DIAGNOSIS — F1120 Opioid dependence, uncomplicated: Secondary | ICD-10-CM | POA: Diagnosis not present

## 2023-03-16 DIAGNOSIS — F1520 Other stimulant dependence, uncomplicated: Secondary | ICD-10-CM | POA: Diagnosis not present

## 2023-03-17 DIAGNOSIS — F1420 Cocaine dependence, uncomplicated: Secondary | ICD-10-CM | POA: Diagnosis not present

## 2023-03-17 DIAGNOSIS — F1120 Opioid dependence, uncomplicated: Secondary | ICD-10-CM | POA: Diagnosis not present

## 2023-03-17 DIAGNOSIS — F1520 Other stimulant dependence, uncomplicated: Secondary | ICD-10-CM | POA: Diagnosis not present

## 2023-03-17 DIAGNOSIS — F1220 Cannabis dependence, uncomplicated: Secondary | ICD-10-CM | POA: Diagnosis not present

## 2023-03-18 DIAGNOSIS — F1220 Cannabis dependence, uncomplicated: Secondary | ICD-10-CM | POA: Diagnosis not present

## 2023-03-18 DIAGNOSIS — F1120 Opioid dependence, uncomplicated: Secondary | ICD-10-CM | POA: Diagnosis not present

## 2023-03-18 DIAGNOSIS — F1420 Cocaine dependence, uncomplicated: Secondary | ICD-10-CM | POA: Diagnosis not present

## 2023-03-18 DIAGNOSIS — F1520 Other stimulant dependence, uncomplicated: Secondary | ICD-10-CM | POA: Diagnosis not present

## 2023-03-20 DIAGNOSIS — F1520 Other stimulant dependence, uncomplicated: Secondary | ICD-10-CM | POA: Diagnosis not present

## 2023-03-20 DIAGNOSIS — F1220 Cannabis dependence, uncomplicated: Secondary | ICD-10-CM | POA: Diagnosis not present

## 2023-03-20 DIAGNOSIS — F1420 Cocaine dependence, uncomplicated: Secondary | ICD-10-CM | POA: Diagnosis not present

## 2023-03-20 DIAGNOSIS — F1120 Opioid dependence, uncomplicated: Secondary | ICD-10-CM | POA: Diagnosis not present

## 2023-03-21 DIAGNOSIS — F1220 Cannabis dependence, uncomplicated: Secondary | ICD-10-CM | POA: Diagnosis not present

## 2023-03-21 DIAGNOSIS — F1520 Other stimulant dependence, uncomplicated: Secondary | ICD-10-CM | POA: Diagnosis not present

## 2023-03-21 DIAGNOSIS — F1420 Cocaine dependence, uncomplicated: Secondary | ICD-10-CM | POA: Diagnosis not present

## 2023-03-21 DIAGNOSIS — F1120 Opioid dependence, uncomplicated: Secondary | ICD-10-CM | POA: Diagnosis not present

## 2023-03-22 DIAGNOSIS — F1420 Cocaine dependence, uncomplicated: Secondary | ICD-10-CM | POA: Diagnosis not present

## 2023-03-22 DIAGNOSIS — F1120 Opioid dependence, uncomplicated: Secondary | ICD-10-CM | POA: Diagnosis not present

## 2023-03-22 DIAGNOSIS — F1520 Other stimulant dependence, uncomplicated: Secondary | ICD-10-CM | POA: Diagnosis not present

## 2023-03-22 DIAGNOSIS — F1220 Cannabis dependence, uncomplicated: Secondary | ICD-10-CM | POA: Diagnosis not present

## 2023-03-23 DIAGNOSIS — F1420 Cocaine dependence, uncomplicated: Secondary | ICD-10-CM | POA: Diagnosis not present

## 2023-03-23 DIAGNOSIS — F1520 Other stimulant dependence, uncomplicated: Secondary | ICD-10-CM | POA: Diagnosis not present

## 2023-03-23 DIAGNOSIS — F1220 Cannabis dependence, uncomplicated: Secondary | ICD-10-CM | POA: Diagnosis not present

## 2023-03-23 DIAGNOSIS — F1120 Opioid dependence, uncomplicated: Secondary | ICD-10-CM | POA: Diagnosis not present

## 2023-03-24 DIAGNOSIS — F411 Generalized anxiety disorder: Secondary | ICD-10-CM | POA: Diagnosis not present

## 2023-03-24 DIAGNOSIS — F1120 Opioid dependence, uncomplicated: Secondary | ICD-10-CM | POA: Diagnosis not present

## 2023-03-24 DIAGNOSIS — F1220 Cannabis dependence, uncomplicated: Secondary | ICD-10-CM | POA: Diagnosis not present

## 2023-03-24 DIAGNOSIS — F1520 Other stimulant dependence, uncomplicated: Secondary | ICD-10-CM | POA: Diagnosis not present

## 2023-03-24 DIAGNOSIS — F1420 Cocaine dependence, uncomplicated: Secondary | ICD-10-CM | POA: Diagnosis not present

## 2023-03-25 DIAGNOSIS — F1120 Opioid dependence, uncomplicated: Secondary | ICD-10-CM | POA: Diagnosis not present

## 2023-03-25 DIAGNOSIS — F1420 Cocaine dependence, uncomplicated: Secondary | ICD-10-CM | POA: Diagnosis not present

## 2023-03-25 DIAGNOSIS — F1520 Other stimulant dependence, uncomplicated: Secondary | ICD-10-CM | POA: Diagnosis not present

## 2023-03-25 DIAGNOSIS — F1220 Cannabis dependence, uncomplicated: Secondary | ICD-10-CM | POA: Diagnosis not present

## 2023-03-27 DIAGNOSIS — F1120 Opioid dependence, uncomplicated: Secondary | ICD-10-CM | POA: Diagnosis not present

## 2023-03-27 DIAGNOSIS — F1220 Cannabis dependence, uncomplicated: Secondary | ICD-10-CM | POA: Diagnosis not present

## 2023-03-27 DIAGNOSIS — F1520 Other stimulant dependence, uncomplicated: Secondary | ICD-10-CM | POA: Diagnosis not present

## 2023-03-27 DIAGNOSIS — F1420 Cocaine dependence, uncomplicated: Secondary | ICD-10-CM | POA: Diagnosis not present

## 2023-03-28 DIAGNOSIS — F1120 Opioid dependence, uncomplicated: Secondary | ICD-10-CM | POA: Diagnosis not present

## 2023-03-28 DIAGNOSIS — F1420 Cocaine dependence, uncomplicated: Secondary | ICD-10-CM | POA: Diagnosis not present

## 2023-03-28 DIAGNOSIS — F1520 Other stimulant dependence, uncomplicated: Secondary | ICD-10-CM | POA: Diagnosis not present

## 2023-03-28 DIAGNOSIS — F1220 Cannabis dependence, uncomplicated: Secondary | ICD-10-CM | POA: Diagnosis not present

## 2023-03-29 DIAGNOSIS — F1120 Opioid dependence, uncomplicated: Secondary | ICD-10-CM | POA: Diagnosis not present

## 2023-03-29 DIAGNOSIS — F1420 Cocaine dependence, uncomplicated: Secondary | ICD-10-CM | POA: Diagnosis not present

## 2023-03-29 DIAGNOSIS — F1220 Cannabis dependence, uncomplicated: Secondary | ICD-10-CM | POA: Diagnosis not present

## 2023-03-29 DIAGNOSIS — F1520 Other stimulant dependence, uncomplicated: Secondary | ICD-10-CM | POA: Diagnosis not present

## 2023-03-30 DIAGNOSIS — F1120 Opioid dependence, uncomplicated: Secondary | ICD-10-CM | POA: Diagnosis not present

## 2023-03-30 DIAGNOSIS — F1420 Cocaine dependence, uncomplicated: Secondary | ICD-10-CM | POA: Diagnosis not present

## 2023-03-30 DIAGNOSIS — F1520 Other stimulant dependence, uncomplicated: Secondary | ICD-10-CM | POA: Diagnosis not present

## 2023-03-30 DIAGNOSIS — F1220 Cannabis dependence, uncomplicated: Secondary | ICD-10-CM | POA: Diagnosis not present

## 2023-03-31 DIAGNOSIS — F1520 Other stimulant dependence, uncomplicated: Secondary | ICD-10-CM | POA: Diagnosis not present

## 2023-03-31 DIAGNOSIS — F411 Generalized anxiety disorder: Secondary | ICD-10-CM | POA: Diagnosis not present

## 2023-03-31 DIAGNOSIS — F1120 Opioid dependence, uncomplicated: Secondary | ICD-10-CM | POA: Diagnosis not present

## 2023-03-31 DIAGNOSIS — F1220 Cannabis dependence, uncomplicated: Secondary | ICD-10-CM | POA: Diagnosis not present

## 2023-03-31 DIAGNOSIS — F1420 Cocaine dependence, uncomplicated: Secondary | ICD-10-CM | POA: Diagnosis not present

## 2023-04-01 DIAGNOSIS — F1220 Cannabis dependence, uncomplicated: Secondary | ICD-10-CM | POA: Diagnosis not present

## 2023-04-01 DIAGNOSIS — F1120 Opioid dependence, uncomplicated: Secondary | ICD-10-CM | POA: Diagnosis not present

## 2023-04-01 DIAGNOSIS — F1420 Cocaine dependence, uncomplicated: Secondary | ICD-10-CM | POA: Diagnosis not present

## 2023-04-01 DIAGNOSIS — F1520 Other stimulant dependence, uncomplicated: Secondary | ICD-10-CM | POA: Diagnosis not present

## 2023-04-03 DIAGNOSIS — F1120 Opioid dependence, uncomplicated: Secondary | ICD-10-CM | POA: Diagnosis not present

## 2023-04-03 DIAGNOSIS — F1520 Other stimulant dependence, uncomplicated: Secondary | ICD-10-CM | POA: Diagnosis not present

## 2023-04-03 DIAGNOSIS — F1420 Cocaine dependence, uncomplicated: Secondary | ICD-10-CM | POA: Diagnosis not present

## 2023-04-03 DIAGNOSIS — F1220 Cannabis dependence, uncomplicated: Secondary | ICD-10-CM | POA: Diagnosis not present

## 2023-04-04 DIAGNOSIS — F1120 Opioid dependence, uncomplicated: Secondary | ICD-10-CM | POA: Diagnosis not present

## 2023-04-04 DIAGNOSIS — F1220 Cannabis dependence, uncomplicated: Secondary | ICD-10-CM | POA: Diagnosis not present

## 2023-04-04 DIAGNOSIS — F1420 Cocaine dependence, uncomplicated: Secondary | ICD-10-CM | POA: Diagnosis not present

## 2023-04-04 DIAGNOSIS — F1520 Other stimulant dependence, uncomplicated: Secondary | ICD-10-CM | POA: Diagnosis not present

## 2023-04-05 DIAGNOSIS — F1520 Other stimulant dependence, uncomplicated: Secondary | ICD-10-CM | POA: Diagnosis not present

## 2023-04-05 DIAGNOSIS — F1120 Opioid dependence, uncomplicated: Secondary | ICD-10-CM | POA: Diagnosis not present

## 2023-04-05 DIAGNOSIS — F1420 Cocaine dependence, uncomplicated: Secondary | ICD-10-CM | POA: Diagnosis not present

## 2023-04-05 DIAGNOSIS — F1220 Cannabis dependence, uncomplicated: Secondary | ICD-10-CM | POA: Diagnosis not present
